# Patient Record
Sex: Female | Born: 1960
Health system: Southern US, Community
[De-identification: ages and names within clinical notes are randomized; demographics above are authoritative.]

## PROBLEM LIST (undated history)

## (undated) DIAGNOSIS — E039 Hypothyroidism, unspecified: Secondary | ICD-10-CM

## (undated) DIAGNOSIS — S83289A Other tear of lateral meniscus, current injury, unspecified knee, initial encounter: Secondary | ICD-10-CM

## (undated) DIAGNOSIS — M47812 Spondylosis without myelopathy or radiculopathy, cervical region: Secondary | ICD-10-CM

## (undated) DIAGNOSIS — F329 Major depressive disorder, single episode, unspecified: Secondary | ICD-10-CM

## (undated) DIAGNOSIS — E785 Hyperlipidemia, unspecified: Secondary | ICD-10-CM

## (undated) DIAGNOSIS — C801 Malignant (primary) neoplasm, unspecified: Secondary | ICD-10-CM

## (undated) DIAGNOSIS — E079 Disorder of thyroid, unspecified: Secondary | ICD-10-CM

## (undated) DIAGNOSIS — G5600 Carpal tunnel syndrome, unspecified upper limb: Secondary | ICD-10-CM

## (undated) DIAGNOSIS — F319 Bipolar disorder, unspecified: Secondary | ICD-10-CM

## (undated) DIAGNOSIS — M94261 Chondromalacia, right knee: Secondary | ICD-10-CM

## (undated) DIAGNOSIS — G5601 Carpal tunnel syndrome, right upper limb: Secondary | ICD-10-CM

## (undated) DIAGNOSIS — F32A Depression, unspecified: Secondary | ICD-10-CM

## (undated) DIAGNOSIS — S83231A Complex tear of medial meniscus, current injury, right knee, initial encounter: Secondary | ICD-10-CM

## (undated) DIAGNOSIS — Z8669 Personal history of other diseases of the nervous system and sense organs: Secondary | ICD-10-CM

## (undated) DIAGNOSIS — F419 Anxiety disorder, unspecified: Secondary | ICD-10-CM

## (undated) DIAGNOSIS — Z860100 Personal history of colon polyps, unspecified: Secondary | ICD-10-CM

## (undated) DIAGNOSIS — T7840XA Allergy, unspecified, initial encounter: Secondary | ICD-10-CM

## (undated) DIAGNOSIS — B009 Herpesviral infection, unspecified: Secondary | ICD-10-CM

## (undated) DIAGNOSIS — R519 Headache, unspecified: Secondary | ICD-10-CM

## (undated) DIAGNOSIS — M1990 Unspecified osteoarthritis, unspecified site: Secondary | ICD-10-CM

## (undated) HISTORY — PX: SKIN CANCER EXCISION: SHX779

## (undated) HISTORY — DX: Spondylosis without myelopathy or radiculopathy, cervical region: M47.812

## (undated) HISTORY — DX: Hyperlipidemia, unspecified: E78.5

## (undated) HISTORY — DX: Herpesviral infection, unspecified: B00.9

## (undated) HISTORY — DX: Disorder of thyroid, unspecified: E07.9

## (undated) HISTORY — DX: Allergy, unspecified, initial encounter: T78.40XA

## (undated) HISTORY — PX: COLONOSCOPY: SHX174

## (undated) HISTORY — DX: Carpal tunnel syndrome, unspecified upper limb: G56.00

---

## 1997-05-29 ENCOUNTER — Other Ambulatory Visit: Admission: RE | Admit: 1997-05-29 | Discharge: 1997-05-29 | Payer: Self-pay | Admitting: Obstetrics and Gynecology

## 1998-01-30 ENCOUNTER — Ambulatory Visit (HOSPITAL_COMMUNITY): Admission: RE | Admit: 1998-01-30 | Discharge: 1998-01-30 | Payer: Self-pay | Admitting: Gastroenterology

## 1998-01-31 ENCOUNTER — Encounter: Payer: Self-pay | Admitting: Obstetrics and Gynecology

## 1998-01-31 ENCOUNTER — Ambulatory Visit (HOSPITAL_COMMUNITY): Admission: RE | Admit: 1998-01-31 | Discharge: 1998-01-31 | Payer: Self-pay | Admitting: Obstetrics and Gynecology

## 1998-02-14 ENCOUNTER — Encounter: Payer: Self-pay | Admitting: Family Medicine

## 1998-02-14 ENCOUNTER — Ambulatory Visit (HOSPITAL_COMMUNITY): Admission: RE | Admit: 1998-02-14 | Discharge: 1998-02-14 | Payer: Self-pay | Admitting: Family Medicine

## 1998-05-30 ENCOUNTER — Other Ambulatory Visit: Admission: RE | Admit: 1998-05-30 | Discharge: 1998-05-30 | Payer: Self-pay | Admitting: Obstetrics and Gynecology

## 1999-06-05 ENCOUNTER — Other Ambulatory Visit: Admission: RE | Admit: 1999-06-05 | Discharge: 1999-06-05 | Payer: Self-pay | Admitting: Obstetrics and Gynecology

## 1999-09-11 ENCOUNTER — Encounter (INDEPENDENT_AMBULATORY_CARE_PROVIDER_SITE_OTHER): Payer: Self-pay

## 1999-09-11 ENCOUNTER — Other Ambulatory Visit: Admission: RE | Admit: 1999-09-11 | Discharge: 1999-09-11 | Payer: Self-pay | Admitting: Obstetrics and Gynecology

## 1999-11-27 ENCOUNTER — Ambulatory Visit (HOSPITAL_COMMUNITY): Admission: RE | Admit: 1999-11-27 | Discharge: 1999-11-27 | Payer: Self-pay | Admitting: Obstetrics and Gynecology

## 1999-11-27 ENCOUNTER — Encounter: Payer: Self-pay | Admitting: Obstetrics and Gynecology

## 1999-12-02 ENCOUNTER — Emergency Department (HOSPITAL_COMMUNITY): Admission: EM | Admit: 1999-12-02 | Discharge: 1999-12-03 | Payer: Self-pay | Admitting: Emergency Medicine

## 2000-02-23 ENCOUNTER — Other Ambulatory Visit: Admission: RE | Admit: 2000-02-23 | Discharge: 2000-02-23 | Payer: Self-pay | Admitting: Obstetrics and Gynecology

## 2000-02-25 ENCOUNTER — Other Ambulatory Visit: Admission: RE | Admit: 2000-02-25 | Discharge: 2000-02-25 | Payer: Self-pay | Admitting: Gynecology

## 2000-07-22 ENCOUNTER — Encounter: Admission: RE | Admit: 2000-07-22 | Discharge: 2000-10-20 | Payer: Self-pay | Admitting: Gynecology

## 2000-08-30 ENCOUNTER — Inpatient Hospital Stay (HOSPITAL_COMMUNITY): Admission: AD | Admit: 2000-08-30 | Discharge: 2000-08-30 | Payer: Self-pay | Admitting: Gynecology

## 2000-09-23 ENCOUNTER — Inpatient Hospital Stay (HOSPITAL_COMMUNITY): Admission: AD | Admit: 2000-09-23 | Discharge: 2000-09-26 | Payer: Self-pay | Admitting: Gynecology

## 2000-09-23 ENCOUNTER — Encounter (INDEPENDENT_AMBULATORY_CARE_PROVIDER_SITE_OTHER): Payer: Self-pay | Admitting: *Deleted

## 2000-09-23 HISTORY — PX: TUBAL LIGATION: SHX77

## 2000-11-02 ENCOUNTER — Other Ambulatory Visit: Admission: RE | Admit: 2000-11-02 | Discharge: 2000-11-02 | Payer: Self-pay | Admitting: Gynecology

## 2001-11-10 ENCOUNTER — Other Ambulatory Visit: Admission: RE | Admit: 2001-11-10 | Discharge: 2001-11-10 | Payer: Self-pay | Admitting: Obstetrics and Gynecology

## 2002-11-11 ENCOUNTER — Other Ambulatory Visit: Admission: RE | Admit: 2002-11-11 | Discharge: 2002-11-11 | Payer: Self-pay | Admitting: Obstetrics and Gynecology

## 2002-12-25 ENCOUNTER — Emergency Department (HOSPITAL_COMMUNITY): Admission: EM | Admit: 2002-12-25 | Discharge: 2002-12-26 | Payer: Self-pay | Admitting: Emergency Medicine

## 2003-11-16 ENCOUNTER — Other Ambulatory Visit: Admission: RE | Admit: 2003-11-16 | Discharge: 2003-11-16 | Payer: Self-pay | Admitting: Obstetrics and Gynecology

## 2005-01-17 ENCOUNTER — Other Ambulatory Visit: Admission: RE | Admit: 2005-01-17 | Discharge: 2005-01-17 | Payer: Self-pay | Admitting: Obstetrics and Gynecology

## 2006-01-19 ENCOUNTER — Other Ambulatory Visit: Admission: RE | Admit: 2006-01-19 | Discharge: 2006-01-19 | Payer: Self-pay | Admitting: Obstetrics and Gynecology

## 2007-03-17 ENCOUNTER — Other Ambulatory Visit: Admission: RE | Admit: 2007-03-17 | Discharge: 2007-03-17 | Payer: Self-pay | Admitting: Obstetrics and Gynecology

## 2007-07-26 DIAGNOSIS — F319 Bipolar disorder, unspecified: Secondary | ICD-10-CM | POA: Insufficient documentation

## 2007-07-27 ENCOUNTER — Ambulatory Visit: Payer: Self-pay | Admitting: Gastroenterology

## 2007-07-27 DIAGNOSIS — R1032 Left lower quadrant pain: Secondary | ICD-10-CM

## 2008-03-01 ENCOUNTER — Ambulatory Visit: Payer: Self-pay | Admitting: Obstetrics and Gynecology

## 2008-03-07 ENCOUNTER — Ambulatory Visit: Payer: Self-pay | Admitting: Obstetrics and Gynecology

## 2008-04-18 ENCOUNTER — Encounter: Payer: Self-pay | Admitting: Obstetrics and Gynecology

## 2008-04-18 ENCOUNTER — Ambulatory Visit: Payer: Self-pay | Admitting: Obstetrics and Gynecology

## 2008-04-18 ENCOUNTER — Other Ambulatory Visit: Admission: RE | Admit: 2008-04-18 | Discharge: 2008-04-18 | Payer: Self-pay | Admitting: Obstetrics and Gynecology

## 2008-05-31 ENCOUNTER — Ambulatory Visit: Payer: Self-pay | Admitting: Gastroenterology

## 2008-06-14 ENCOUNTER — Ambulatory Visit: Payer: Self-pay | Admitting: Gastroenterology

## 2009-04-30 ENCOUNTER — Other Ambulatory Visit: Admission: RE | Admit: 2009-04-30 | Discharge: 2009-04-30 | Payer: Self-pay | Admitting: Obstetrics and Gynecology

## 2009-04-30 ENCOUNTER — Ambulatory Visit: Payer: Self-pay | Admitting: Obstetrics and Gynecology

## 2010-02-28 ENCOUNTER — Institutional Professional Consult (permissible substitution) (INDEPENDENT_AMBULATORY_CARE_PROVIDER_SITE_OTHER): Payer: 59 | Admitting: Obstetrics and Gynecology

## 2010-02-28 DIAGNOSIS — N951 Menopausal and female climacteric states: Secondary | ICD-10-CM

## 2010-02-28 DIAGNOSIS — N952 Postmenopausal atrophic vaginitis: Secondary | ICD-10-CM

## 2010-05-08 ENCOUNTER — Encounter: Payer: 59 | Admitting: Obstetrics and Gynecology

## 2010-05-20 ENCOUNTER — Other Ambulatory Visit (HOSPITAL_COMMUNITY)
Admission: RE | Admit: 2010-05-20 | Discharge: 2010-05-20 | Disposition: A | Discharge status: Home / Self Care | Payer: 59 | Source: Ambulatory Visit | Attending: Obstetrics and Gynecology | Admitting: Obstetrics and Gynecology

## 2010-05-20 ENCOUNTER — Encounter (INDEPENDENT_AMBULATORY_CARE_PROVIDER_SITE_OTHER): Payer: 59 | Admitting: Obstetrics and Gynecology

## 2010-05-20 ENCOUNTER — Other Ambulatory Visit: Payer: Self-pay | Admitting: Obstetrics and Gynecology

## 2010-05-20 DIAGNOSIS — Z833 Family history of diabetes mellitus: Secondary | ICD-10-CM

## 2010-05-20 DIAGNOSIS — Z124 Encounter for screening for malignant neoplasm of cervix: Secondary | ICD-10-CM | POA: Insufficient documentation

## 2010-05-20 DIAGNOSIS — Z13220 Encounter for screening for lipoid disorders: Secondary | ICD-10-CM

## 2010-05-20 DIAGNOSIS — E079 Disorder of thyroid, unspecified: Secondary | ICD-10-CM

## 2010-05-20 DIAGNOSIS — Z01419 Encounter for gynecological examination (general) (routine) without abnormal findings: Secondary | ICD-10-CM

## 2010-05-31 NOTE — Discharge Summary (Signed)
[place]  Patient:    Ann Mendez, Ann Mendez Visit Number: [id number] MRN: [id number]          Service Type: OBS Location: 910A 9101 01 Attending Physician:  Merrily Pew Dictated by:   Antony Contras, [facility] Admit Date:  09/23/2000 Discharge Date: 09/26/2000                             Discharge Summary  DISCHARGE DIAGNOSES: 1. Intrauterine pregnancy at 37 weeks. 2. Labor. 3. Breech presentation. 4. Failed version. 5. Desires permanent sterilization. 6. Leiomyomata.  PROCEDURES:  Primary low transverse cesarean section, modified Pomeroy bilateral tubal sterilization.  HISTORY OF PRESENT ILLNESS:  Patient is a 50 year old gravida 6, para 1-0-4-1 with an LMP of January 05, 2000, [facility] of October 12, 2000.  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  Patient declined amniocentesis, bipolar disorder, history of HELLP syndrome with previous pregnancy.  LABORATORY DATA:  Blood type O positive, antibody screen negative, RPR, HBSAG, HIV nonreactive.  HOSPITAL COURSE:  The patient was admitted on September 23, 2000 for an attempted version secondary to frank breech presentation.  She was found to be in early labor prior to the version and the attempted version failed.  Due to consistent contractions, she decided to proceed with a cesarean section since she did also desire permanent sterilization.  Primary low-transverse cervical cesarean section modified Pomeroy bilateral tubal was performed by Dr. Audie Box under spinal anesthesia.  Findings included a normal female infant, Apgars of 8/9, 7 pounds 5 ounces, pelvic anatomy normal with exception of several small subserosal leiomyoma.  POSTPARTUM COURSE:  The patient did remain afebrile and was able to be discharged on her third postoperative day in satisfactory condition.  She also had no problems voiding.  CBC, hematocrit 27, hemoglobin 9.4, WBCs 6.7, platelets  232.  DISPOSITION:  Followup in six weeks.  Continue with prenatal vitamins and iron, Motrin and Tylox for pain. Dictated by:   Antony Contras, [facility] Attending Physician:  Merrily Pew DD:  10/16/00 TD:  10/17/00 Job: [id number]

## 2010-05-31 NOTE — Op Note (Signed)
[facility]  Patient:    Ann Mendez, Ann Mendez Visit Number: [id number] MRN: [id number]          Service Type: OBS Location: 910A 9102 01 Attending Physician:  Merrily Pew Proc. Date: 09/23/00 Admit Date:  09/23/2000                             Operative Report  PROCEDURE:                    Attempted external cephalic version.  SURGEON:                      Timothy P. Fontaine, M.D.  DESCRIPTION OF PROCEDURE:     The patient is a 50 year old G6, P44, AB4 female at [redacted] weeks gestation with frank breech presentation.  The patient has history of preterm contractions for which she has been treated with oral terbutaline and has done well; and, it now found to be in the frank breech presentation. The risks and benefits of attempted version were just reviewed with the patient and her husband to include the potential for a significant catastrophic event such as cord or placental accident, and they agree with attempted version.  The patients placenta is anterior.  Upon presentation for the version she was found to be contracting every two to three minutes, although these were not palpable to her and she received IV hydration and a subcu terbutaline 0.25 mg prior to the attempted procedure.  Under ultrasound guidance attempted version was performed and the fetus was solid in its presentation and and was unable to be rotated to any significant degree either through a front rollover or a back flip.  Due to significant patient discomfort and that it was obvious that the fetus was not going to convert easily, the procedure was abandoned.  The patient was subsequently placed on external monitors and had a reactive fetal tracing as it did prior to the procedure; and, the patient continued to contract every two to three minutes.  Pelvic exam was performed.  The patient was found to be a loose fingertip dilated, 50% effaced and high; and, after extended  monitoring showing continued contractions every two to three minutes the decision was made to proceed with cesarean section as was discussed with the patient and the counselling as noted in her admission history and physical. Attending Physician:  Merrily Pew DD:  09/23/00 TD:  09/23/00 Job: [id number]

## 2010-05-31 NOTE — Op Note (Signed)
[facility]  Patient:    Ann Mendez, Ann Mendez Visit Number: [id number] MRN: [id number]          Service Type: OBS Location: 910A 9102 01 Attending Physician:  Merrily Pew Proc. Date: 09/23/00 Admit Date:  09/23/2000                             Operative Report  PREOPERATIVE DIAGNOSES:       1. Pregnancy at 37 weeks.                               2. Labor.                               3. Breech presentation.                               4. Failed version.                               5. Desires permanent sterilization.  POSTOPERATIVE DIAGNOSES:      1. Pregnancy at 37 weeks.                               2. Labor.                               3. Breech presentation.                               4. Failed version.                               5. Desires permanent sterilization.                               6. Leiomyomata.  OPERATIONS:                   1. Primary low transverse cervical cesarean                                  section.                               2. Modified Pomeroy bilateral tubal                                  sterilization. SURGEON:                      Timothy P. Fontaine, M.D.  ASSISTANT:                    Scrub technician.  ANESTHESIA:                   Spinal.  ESTIMATED  BLOOD LOSS:         Less than 500 cc.  COMPLICATIONS:                None.  SPECIMENS:                    Portions of right and portions of left fallopian tubes.  Placenta.  Umbilical cord blood.  FINDINGS:                     At 16:56 a normal female infant, Apgars 8 and 9, weight 7 pounds 5 ounces.  Pelvic anatomy noted to be normal with the exception of several small subserosal leiomyomata.  DESCRIPTION OF PROCEDURE:     Patient was brought to the operating room, underwent spinal anesthesia and was placed in the left tilt to supine position.  She received an abdominal preparation of Betadine solution. Bladder was emptied  with an indwelling Foley catheterization placed in a sterile technique.  The patient was draped in the usual fashion.  After assuring adequate anesthesia the abdomen was sharply entered through a Pfannenstiel incision achieving adequate hemostasis at all levels.  The bladder flap was then sharply and bluntly developed without difficulty.  The uterus was sharply entered in the lower uterine segment and bluntly extended laterally.  The bulging membranes were ruptured; the fluid noted to be clear.  The infant was found in the frank breech presentation, converted to a footling breech presentation, and underwent a breech extraction.  The nares and mouth were then suctioned, the cord doubly clamped and cut, and the infant was handed to pediatrics in attendance.  Samples of cord blood were obtained.  Placenta was then spontaneously extruded and noted to be intact.  The uterus was exteriorized.  The endometrial cavity was explored with a sponge to remove all placental and membrane fragments.  The patient received antibiotic prophylaxis at this time and subsequently the uterine incision was closed in two layers using 0-Vicryl suture first in a running interlocking stitch followed by an imbricating stitch.  The attention was then turned to the tubal sterilization.  The right fallopian tube was identified, traced from its insertion to its fimbriated end.  A mid tubal segment was grasped with a Babcock clamp, elevated and a mid tubal segment was doubly ligated using 0-plain suture, and the intervening segment sharply excised.  Tubal lumen as well as adequate hemostasis were grossly visualized.  A similar procedure was carried on the other side.  The uterus was then returned to the abdomen, which was copiously irrigated showing adequate hemostasis.  Both tubal sites were reinspected to assure adequate hemostasis.  The anterior fascia was then reapproximated using 0-Vicryl suture in a running  stitch.  Subcutaneous tissues were irrigated. Adequate hemostasis was achieved with electrocautery and the skin was reapproximated with staples.  A sterile dressing was applied.  The patient was taken to the recovery room in good condition having tolerated the procedure well. Attending Physician:  Merrily Pew DD:  09/23/00 TD:  09/23/00 Job: [id number]

## 2010-05-31 NOTE — H&P (Signed)
[facility]  Patient:    Ann Mendez, Ann Mendez Visit Number: [id number] MRN: [id number]          Service Type: OBS Location: 910A 9102 01 Attending Physician:  Merrily Pew Dictated by:   Nadyne Coombes Fontaine, M.D. Admit Date:  09/23/2000                           History and Physical  CHIEF COMPLAINT:              Pregnancy of 37 weeks, frank breech presentation, early labor, failed attempted version.  HISTORY OF PRESENT ILLNESS:   A 50 year old G33, P68, AB4 female at [redacted] weeks gestation, admitted with history of preterm labor on oral terbutaline.  The patient was found to be in the frank breech presentation and was counseled for attempted version versus primary cesarean section and elected for attempted version.  An attempted version was made today.  She was found to be in early labor prior to the version and the attempted version failed.  She subsequently was found to continue to contract consistent with early labor and elected to proceed with cesarean section at this time.  The patient also wants a bilateral tubal sterilization.  PRENATAL COURSE:              Prenatal course has been complicated by preterm contractions, for which she was started on oral terbutaline and which she did well until the day of version, at which time she began contracting on a regular basis early in the morning and continuing through her attempted version.  PAST MEDICAL HISTORY:         Consistent with bipolar disorder, for which she is on Prozac.  PAST SURGICAL HISTORY:        Includes D&C.  ALLERGIES:                    SULFA DRUGS.  MEDICATIONS:                  Prozac, prenatal vitamins, terbutaline oral.  REVIEW OF SYSTEMS:            Noncontributory.  ADMISSION PHYSICAL EXAMINATION:  VITAL SIGNS:                  Afebrile, vital signs are stable.  HEENT:                        Normal.  LUNGS:                        Clear.  CARDIAC:                       Regular rate without rubs, murmurs, or gallops.  ABDOMEN:                      With a gravid breech presentation.  PELVIC:                       Reactive fetus.  Contractions every 2-3 minutes. Pelvic loose fingertip, 50% high.  ASSESSMENT:                   A 50 year old G6, P57, AB4 female, 37 weeks, breech presentation, failed version, early labor, for cesarean section and tubal sterilization.  The risks, benefits, indications, and  alternatives for the procedure were discussed with the patient and she elects to proceed with cesarean section.  The permanency as well as the potential for failure with her tubal sterilization was reviewed, and the patient understands this and wants to proceed with permanent sterilization.  The intraoperative and postoperative risks of the surgery to include inadvertent injury to the internal organs, including bowel, bladder, ureters, vessels, and nerves, necessitating measures for reparative surgeries and future reparative surgeries, including ostomy formation, was all discussed with her.  The risks and complications requiring opening and drainage of incisions, closure by secondary intention, or prolonged antibiotics was all reviewed with her.  The risks of hemorrhage necessitating transfusion and the risks of transfusion up to and including HIV, were all discussed, understood, and accepted.  The patients questions were answered to her satisfaction.  She is ready to proceed with surgery.Dictated by:   Nadyne Coombes. Fontaine, M.D. Attending Physician:  Merrily Pew DD:  09/23/00 TD:  09/23/00 Job: [phone]

## 2010-07-03 ENCOUNTER — Encounter (INDEPENDENT_AMBULATORY_CARE_PROVIDER_SITE_OTHER): Payer: 59

## 2010-07-03 DIAGNOSIS — Z13820 Encounter for screening for osteoporosis: Secondary | ICD-10-CM

## 2010-09-27 ENCOUNTER — Encounter: Payer: Self-pay | Admitting: Obstetrics and Gynecology

## 2010-10-22 ENCOUNTER — Telehealth: Payer: Self-pay | Admitting: *Deleted

## 2010-10-22 MED ORDER — PROGESTERONE MICRONIZED 100 MG PO CAPS: 100.0000 mg | ORAL_CAPSULE | Freq: Every day | ORAL | Status: DC

## 2010-10-22 NOTE — Telephone Encounter (Signed)
Pt informed and wants to try taking it daily and will start in Novemebr. rx sent to pharmacy.

## 2010-10-22 NOTE — Telephone Encounter (Signed)
Pt states doing fine on estrogen patch and on progesterone D1-12. And now is having period. She states she is ok with having period again bc of progesterone but if there was a way she can not have a period that would be good too. CAn she take progesterone everyday? PLs advise?

## 2010-10-22 NOTE — Telephone Encounter (Signed)
She could do her progesterone daily. She would have to reduce the dose to 2.5 mg daily. There could be an adjustment where she has irregular bleeding.

## 2010-12-17 ENCOUNTER — Telehealth: Payer: Self-pay

## 2010-12-17 MED ORDER — MEDROXYPROGESTERONE ACETATE 2.5 MG PO TABS: 2.5000 mg | ORAL_TABLET | Freq: Every day | ORAL | Status: DC

## 2010-12-17 NOTE — Telephone Encounter (Signed)
PT. TOOK 21 DAYS OF GENERIC PROMETRIUM 100MG . & DEVELOPED SEVERE DIZZINESS,NAUSEA, DROWSINESS. SINCE STOPPING RX PTS. SYMPTOMS HAVE COMPLETELY RESOLVED. DOES NOT WANT TO USE RX ANYMORE. WHAT ELSE CANSHE DO?

## 2010-12-17 NOTE — Telephone Encounter (Signed)
Medroxyprogesterone 2.5 mg daily

## 2010-12-17 NOTE — Telephone Encounter (Signed)
PT. NOTIFIED OF DR. G'S NOTE BELOW AND SENT RX TO HER PHARMACY. 

## 2011-01-01 ENCOUNTER — Telehealth: Payer: Self-pay | Admitting: *Deleted

## 2011-01-01 NOTE — Telephone Encounter (Signed)
Pt called stating she is doing very well on her new progesterone medication.

## 2011-01-15 ENCOUNTER — Other Ambulatory Visit: Payer: Self-pay | Admitting: Obstetrics and Gynecology

## 2011-03-19 ENCOUNTER — Telehealth: Payer: Self-pay | Admitting: *Deleted

## 2011-03-19 MED ORDER — ESTROGENS, CONJUGATED 0.625 MG/GM VA CREA: TOPICAL_CREAM | VAGINAL | Status: DC

## 2011-03-19 NOTE — Telephone Encounter (Signed)
Pt called c/o vaginal dryness during sex, which makes it very painful. She was taking generic estrogen patch and Medroxyprogesterone 2.5 mg daily which she has stopped because it was not working. Pt said it made her mood bad, pt said that Dr. Nolen Mu placed her on abilify, so pt would like to know if you would recommend something OTC to help with vaginal dryness? She has tried KY jelly beads but she can no longer find them. Please advise

## 2011-03-19 NOTE — Telephone Encounter (Signed)
What would work better is Premarin vaginal cream half a gram at bedtime in vagina 3 times a week. It has estrogen which will get rid of the dryness. There is very little absorption so it is safe. It'll take about 6 weeks to be fully effective.

## 2011-03-19 NOTE — Telephone Encounter (Signed)
Per pt okay to leave detailed message on voicemail all the below was left on vm. rx sent to pharmacy.

## 2011-05-13 ENCOUNTER — Encounter: Payer: Self-pay | Admitting: Gynecology

## 2011-05-13 DIAGNOSIS — A6000 Herpesviral infection of urogenital system, unspecified: Secondary | ICD-10-CM | POA: Insufficient documentation

## 2011-05-13 DIAGNOSIS — M1990 Unspecified osteoarthritis, unspecified site: Secondary | ICD-10-CM | POA: Insufficient documentation

## 2011-05-23 ENCOUNTER — Encounter: Payer: Self-pay | Admitting: Obstetrics and Gynecology

## 2011-05-23 ENCOUNTER — Ambulatory Visit (INDEPENDENT_AMBULATORY_CARE_PROVIDER_SITE_OTHER): Payer: 59 | Admitting: Obstetrics and Gynecology

## 2011-05-23 VITALS — BP 116/68 | Ht 62.0 in | Wt 110.0 lb

## 2011-05-23 DIAGNOSIS — Z01419 Encounter for gynecological examination (general) (routine) without abnormal findings: Secondary | ICD-10-CM

## 2011-05-23 LAB — CBC WITH DIFFERENTIAL/PLATELET
Basophils Absolute: 0.1 10*3/uL (ref 0.0–0.1)
Basophils Relative: 1 % (ref 0–1)
Eosinophils Absolute: 0 10*3/uL (ref 0.0–0.7)
Eosinophils Relative: 1 % (ref 0–5)
Lymphocytes Relative: 40 % (ref 12–46)
MCH: 32.2 pg (ref 26.0–34.0)
MCHC: 33.5 g/dL (ref 30.0–36.0)
MCV: 96.2 fL (ref 78.0–100.0)
Platelets: 373 10*3/uL (ref 150–400)
RDW: 12.3 % (ref 11.5–15.5)
WBC: 5.9 10*3/uL (ref 4.0–10.5)

## 2011-05-23 MED ORDER — ESTROGENS, CONJUGATED 0.625 MG/GM VA CREA: TOPICAL_CREAM | VAGINAL | Status: DC

## 2011-05-23 MED ORDER — ACYCLOVIR 200 MG PO CAPS: 400.0000 mg | ORAL_CAPSULE | Freq: Every morning | ORAL | Status: DC

## 2011-05-23 NOTE — Progress Notes (Signed)
Patient came to see me today for her annual GYN exam. She stopped HRT because of side effects both with Prometrium and medroxyprogesterone. Her psychiatrist added abilify and that has helped her control the hot flashes. She still has them but they are tolerable. She uses Premarin vaginal cream for vaginal dryness. Since she stopped the HRT she's had no vaginal bleeding. She is having no pelvic pain. She had a mammogram in September which was normal. She had a normal bone density last year. She uses Zovirax 400 mg daily for prevention of HSV. When day she took 200 mg daily and got a reoccurrence so she's back on 400 daily. She had a normal cholesterol last year.  Physical examination: Beola Cord present. HEENT within normal limits. Neck: Thyroid not large. No masses. Supraclavicular nodes: not enlarged. Breasts: Examined in both sitting and lying  position. No skin changes and no masses. Abdomen: Soft no guarding rebound or masses or hernia. Pelvic: External: Within normal limits. BUS: Within normal limits. Vaginal:within normal limits. Good estrogen effect. No evidence of cystocele rectocele or enterocele. Cervix: clean. Uterus: Normal size and shape. Adnexa: No masses. Rectovaginal exam: Confirmatory and negative. Extremities: Within normal limits.  Assessment: #1. Menopausal symptoms #2. HSV  Plan: Discussed a half strength or full strength Activella. For the moment she will wait. Continue Premarin vaginal cream. Continue acyclovir 400  milligrams daily. Continue yearly mammograms.

## 2011-05-28 ENCOUNTER — Encounter: Payer: Self-pay | Admitting: Obstetrics and Gynecology

## 2011-09-09 ENCOUNTER — Telehealth: Payer: Self-pay | Admitting: Obstetrics and Gynecology

## 2011-09-09 NOTE — Telephone Encounter (Signed)
Patient said at yearly exam you discussed problems she was having with estrogen patch since adding the progesterone back.  Patient said you told her to call if problems continued.  She said she is continuing to have hotflashes.  She said her biggest complaint is difficulty falling asleep, staying asleep and waking in the night and not being able to go back to sleep.  Interested in what other options are available to help.

## 2011-09-09 NOTE — Telephone Encounter (Signed)
There are other options and is complicated. Make her office visit to discuss.

## 2011-09-09 NOTE — Telephone Encounter (Signed)
Left message for patient to call and schedule consult to discuss options as they are complicated and Dr. Reece Agar would like to sit down with her and discuss.

## 2011-09-17 ENCOUNTER — Ambulatory Visit (INDEPENDENT_AMBULATORY_CARE_PROVIDER_SITE_OTHER): Payer: 59 | Admitting: Obstetrics and Gynecology

## 2011-09-17 DIAGNOSIS — Z780 Asymptomatic menopausal state: Secondary | ICD-10-CM

## 2011-09-17 MED ORDER — MEDROXYPROGESTERONE ACETATE 2.5 MG PO TABS: 2.5000 mg | ORAL_TABLET | Freq: Every day | ORAL | Status: DC

## 2011-09-17 MED ORDER — ESTRADIOL 0.05 MG/24HR TD PTWK: 1.0000 | MEDICATED_PATCH | TRANSDERMAL | Status: DC

## 2011-09-17 NOTE — Patient Instructions (Signed)
Keep me updated

## 2011-09-17 NOTE — Progress Notes (Signed)
Patient came to see me today to discuss menopausal symptoms. She is having severe hot flashes associated with sleep disturbance and some vaginal dryness in spite of using estrogen cream. She is also noticed diminished libido. She tried testosterone cream without results. She does see Dr. Nolen Mu for bipolar disorder and is on medication for that. Her hot flashes and sleep problems were fine when she was on the estrogen patch but when she did cyclical progesterone she got severe PMS when she started her period. She also tried daily Prometrium and had side effects from it. We had a 35 minute discussion today about options. Options discussed included daily progesterone other than Prometrium with hopes that if she doesn't have withdrawal bleeding she will not get PMS. We also discussed a Mirena IUD. We also discussed progesterone every 3 months or none at  All with monitoring of the endometrium with either ultrasound or biopsy. We also discussed hopefully availability of Tsec if it gets FDA approval. We will start with Climara patch 0.05 mg weekly and daily medroxyprogesterone 2.5 mg daily. She will let me  know how she is doing. She will discussed medication change with Dr. Nolen Mu for her libido.

## 2011-09-26 ENCOUNTER — Telehealth: Payer: Self-pay | Admitting: *Deleted

## 2011-09-26 NOTE — Telephone Encounter (Signed)
(  Pt is aware you are out of the office) pt is taking climara patch & provera 2.5 mg daily. C/o that she feels more depressed that usually for about x 1 week now. No suicidal thoughts. Pt said she felt the same way before when she tried HRT, she asked if there is a natural progesterone (compound form) her sister uses this kind. Pt would like you advice if the above could cause her to feel this way. Please advise

## 2011-09-27 NOTE — Telephone Encounter (Signed)
prometrium 100mg  daily

## 2011-10-09 ENCOUNTER — Ambulatory Visit (INDEPENDENT_AMBULATORY_CARE_PROVIDER_SITE_OTHER): Payer: 59 | Admitting: Internal Medicine

## 2011-10-09 ENCOUNTER — Encounter: Payer: Self-pay | Admitting: Internal Medicine

## 2011-10-09 VITALS — BP 118/60 | HR 68 | Temp 97.2°F | Resp 18 | Wt 113.0 lb

## 2011-10-09 DIAGNOSIS — Z780 Asymptomatic menopausal state: Secondary | ICD-10-CM | POA: Insufficient documentation

## 2011-10-09 DIAGNOSIS — Z8669 Personal history of other diseases of the nervous system and sense organs: Secondary | ICD-10-CM

## 2011-10-09 DIAGNOSIS — F319 Bipolar disorder, unspecified: Secondary | ICD-10-CM

## 2011-10-09 DIAGNOSIS — E785 Hyperlipidemia, unspecified: Secondary | ICD-10-CM | POA: Insufficient documentation

## 2011-10-09 DIAGNOSIS — M4800 Spinal stenosis, site unspecified: Secondary | ICD-10-CM

## 2011-10-09 DIAGNOSIS — M1990 Unspecified osteoarthritis, unspecified site: Secondary | ICD-10-CM

## 2011-10-09 LAB — TSH: TSH: 3.796 u[IU]/mL (ref 0.350–4.500)

## 2011-10-09 LAB — COMPREHENSIVE METABOLIC PANEL
ALT: 16 U/L (ref 0–35)
AST: 21 U/L (ref 0–37)
Alkaline Phosphatase: 52 U/L (ref 39–117)
Creat: 0.66 mg/dL (ref 0.50–1.10)
Total Bilirubin: 0.5 mg/dL (ref 0.3–1.2)

## 2011-10-09 LAB — LIPID PANEL
Total CHOL/HDL Ratio: 3.7 Ratio
VLDL: 32 mg/dL (ref 0–40)

## 2011-10-09 NOTE — Patient Instructions (Addendum)
Schedule cpe  Labs will be mailed to you

## 2011-10-09 NOTE — Progress Notes (Signed)
Subjective:    Patient ID: Ann Mendez, female    DOB: December 09, 1960, 51 y.o.   MRN: [id number]  HPI Ann Mendez is  A new pt here to establish primary care.  PMH of BAD managed by Dr. Nolen Mu, DJD, hyperlipidemia,  Spinal stenosis, and history of bell's palsy.  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    She does have lower libido but is managing this OK.  She has 53 yo at home  Husband supportive.   Allergies  Allergen Reactions  . Sulfonamide Derivatives    Past Medical History  Diagnosis Date  . Herpes progenitalis   . Osteoarthritis    Past Surgical History  Procedure Date  . Tubal ligation   . Cesarean section    History   Social History  . Marital Status: Married    Spouse Name: N/A    Number of Children: N/A  . Years of Education: N/A   Occupational History  . Not on file.   Social History Main Topics  . Smoking status: Never Smoker   . Smokeless tobacco: Never Used  . Alcohol Use: 0.6 oz/week    1 Glasses of wine per week  . Drug Use: No  . Sexually Active: Yes    Birth Control/ Protection: Surgical   Other Topics Concern  . Not on file   Social History Narrative  . No narrative on file   Family History  Problem Relation Age of Onset  . Heart disease Father   . Hypertension Father   . Cancer Father   . Heart disease Brother   . Hypertension Brother   . Cancer Brother   . Diabetes Maternal Grandfather   . Arthritis Mother    Patient Active Problem List  Diagnosis  . BIPOLAR DISORDER UNSPECIFIED  . ABDOMINAL PAIN, LEFT LOWER QUADRANT  . Herpes progenitalis  . Osteoarthritis  . DJD (degenerative joint disease)  . Other and unspecified hyperlipidemia  . Menopause  . Spinal stenosis  . History of Bell's palsy   Current Outpatient Prescriptions on File Prior to Visit  Medication Sig Dispense  Refill  . acyclovir (ZOVIRAX) 200 MG capsule Take 2 capsules (400 mg total) by mouth every morning.  30 capsule  12  . ARIPiprazole (ABILIFY) 10 MG tablet Take 10 mg by mouth daily.      . Calcium Carbonate-Vitamin D (CALCIUM + D PO) Take by mouth.      . Cholecalciferol (VITAMIN D PO) Take by mouth.      . conjugated estrogens (PREMARIN) vaginal cream Place vaginally 3 (three) times a week. Place 1/2 gram vaginally at bedtime 3 times a week  42.5 g  0  . lamoTRIgine (LAMICTAL) 200 MG tablet Take 200 mg by mouth daily.      . Multiple Vitamin (MULTIVITAMIN) tablet Take 1 tablet by mouth daily.      . sertraline (ZOLOFT) 50 MG tablet Take 50 mg by mouth daily.      Marland Kitchen estradiol (CLIMARA - DOSED IN MG/24 HR) 0.05 mg/24hr Place 1 patch (0.05 mg total) onto the skin once a week.  4 patch  8  . medroxyPROGESTERone (PROVERA) 2.5 MG tablet Take 1 tablet (2.5 mg total) by mouth daily.  30 tablet  8       Review of Systems  see HPI Objective:   Physical Exam  Physical Exam  Nursing note and vitals reviewed.  Constitutional: She is oriented to person, place, and time. She appears well-developed and well-nourished.  HENT:  Head: Normocephalic and atraumatic.  Cardiovascular: Normal rate and regular rhythm. Exam reveals no gallop and no friction rub.  No murmur heard.  Pulmonary/Chest: Breath sounds normal. She has no wheezes. She has no rales.  Neurological: She is alert and oriented to person, place, and time.  Skin: Skin is warm and dry.  Psychiatric: She has a normal mood and affect. Her behavior is normal.            Assessment & Plan:  Menopause   Intolerant of HT    Low libido  Doubt that testosterone would be of great benefit and may change mood with BAD.  Psychiatric meds likely contributing to low libido.  Gave number to counselor [employer].  DJD  BAD managed Dr. Nolen Mu  Spinal stenosis  Managed by orthopedist

## 2011-10-10 LAB — VITAMIN D 25 HYDROXY (VIT D DEFICIENCY, FRACTURES): Vit D, 25-Hydroxy: 66 ng/mL (ref 30–89)

## 2011-10-14 ENCOUNTER — Telehealth: Payer: Self-pay | Admitting: *Deleted

## 2011-10-14 NOTE — Telephone Encounter (Signed)
Lab results mailed to pt.

## 2011-10-14 NOTE — Telephone Encounter (Signed)
Left message awaiting return call

## 2011-10-15 ENCOUNTER — Telehealth: Payer: Self-pay | Admitting: *Deleted

## 2011-10-15 NOTE — Telephone Encounter (Signed)
Pt lab results mailed to pt

## 2011-10-15 NOTE — Telephone Encounter (Signed)
Pt wishes to try diet changes and return in one month for a repeat of labs Dr Constance Goltz made aware and approves copy of labs and dash diet mailed to pt

## 2011-10-17 ENCOUNTER — Encounter: Payer: Self-pay | Admitting: Obstetrics and Gynecology

## 2011-10-31 ENCOUNTER — Encounter: Payer: Self-pay | Admitting: *Deleted

## 2011-11-11 ENCOUNTER — Encounter (HOSPITAL_COMMUNITY): Payer: Self-pay

## 2011-11-11 ENCOUNTER — Emergency Department (HOSPITAL_COMMUNITY)
Admission: EM | Admit: 2011-11-11 | Discharge: 2011-11-11 | Disposition: A | Discharge status: Home / Self Care | Payer: 59 | Attending: Emergency Medicine | Admitting: Emergency Medicine

## 2011-11-11 ENCOUNTER — Ambulatory Visit (HOSPITAL_COMMUNITY)
Admission: RE | Admit: 2011-11-11 | Discharge: 2011-11-11 | Disposition: A | Discharge status: Home / Self Care | Payer: 59 | Attending: Psychiatry | Admitting: Psychiatry

## 2011-11-11 ENCOUNTER — Ambulatory Visit (INDEPENDENT_AMBULATORY_CARE_PROVIDER_SITE_OTHER): Payer: 59 | Admitting: Internal Medicine

## 2011-11-11 ENCOUNTER — Encounter: Payer: Self-pay | Admitting: Internal Medicine

## 2011-11-11 ENCOUNTER — Encounter (HOSPITAL_COMMUNITY): Payer: Self-pay | Admitting: *Deleted

## 2011-11-11 VITALS — BP 148/75 | HR 73 | Temp 97.3°F | Resp 18 | Wt 111.0 lb

## 2011-11-11 DIAGNOSIS — F329 Major depressive disorder, single episode, unspecified: Secondary | ICD-10-CM

## 2011-11-11 DIAGNOSIS — F319 Bipolar disorder, unspecified: Secondary | ICD-10-CM | POA: Insufficient documentation

## 2011-11-11 DIAGNOSIS — F32A Depression, unspecified: Secondary | ICD-10-CM | POA: Insufficient documentation

## 2011-11-11 DIAGNOSIS — Z9851 Tubal ligation status: Secondary | ICD-10-CM | POA: Insufficient documentation

## 2011-11-11 DIAGNOSIS — F3289 Other specified depressive episodes: Secondary | ICD-10-CM | POA: Insufficient documentation

## 2011-11-11 DIAGNOSIS — F3189 Other bipolar disorder: Secondary | ICD-10-CM | POA: Insufficient documentation

## 2011-11-11 DIAGNOSIS — Z8619 Personal history of other infectious and parasitic diseases: Secondary | ICD-10-CM | POA: Insufficient documentation

## 2011-11-11 DIAGNOSIS — Z79899 Other long term (current) drug therapy: Secondary | ICD-10-CM | POA: Insufficient documentation

## 2011-11-11 DIAGNOSIS — M1990 Unspecified osteoarthritis, unspecified site: Secondary | ICD-10-CM | POA: Insufficient documentation

## 2011-11-11 HISTORY — DX: Depression, unspecified: F32.A

## 2011-11-11 HISTORY — DX: Major depressive disorder, single episode, unspecified: F32.9

## 2011-11-11 LAB — CBC WITH DIFFERENTIAL/PLATELET
Basophils Relative: 1 % (ref 0–1)
Hemoglobin: 14.6 g/dL (ref 12.0–15.0)
Lymphocytes Relative: 28 % (ref 12–46)
MCHC: 34.9 g/dL (ref 30.0–36.0)
Monocytes Relative: 8 % (ref 3–12)
Neutro Abs: 3.8 10*3/uL (ref 1.7–7.7)
Neutrophils Relative %: 62 % (ref 43–77)
RBC: 4.48 MIL/uL (ref 3.87–5.11)
WBC: 6.1 10*3/uL (ref 4.0–10.5)

## 2011-11-11 LAB — BASIC METABOLIC PANEL
BUN: 11 mg/dL (ref 6–23)
CO2: 26 mEq/L (ref 19–32)
Chloride: 100 mEq/L (ref 96–112)
GFR calc Af Amer: >90 mL/min (ref >90)
Potassium: 4.3 mEq/L (ref 3.5–5.1)

## 2011-11-11 LAB — RAPID URINE DRUG SCREEN, HOSP PERFORMED
Cocaine: NOT DETECTED
Opiates: NOT DETECTED

## 2011-11-11 MED ORDER — ACETAMINOPHEN 325 MG PO TABS
650.0000 mg | ORAL_TABLET | Freq: Once | ORAL | Status: AC
  Administered 2011-11-11: 650 mg via ORAL
  Filled 2011-11-11: qty 2

## 2011-11-11 NOTE — ED Notes (Signed)
MD at bedside. EDP Zammit present to evaluate this pt

## 2011-11-11 NOTE — Progress Notes (Signed)
Subjective:    Patient ID: Ann Mendez, female    DOB: Aug 31, 1960, 51 y.o.   MRN: [id number]  HPI  Ann Mendez is here for acute visit.  She is crying entire interview.  She reports her depression is much worse.  She was at Dr. Loralie Champagne office yesterday and seen by ??NP Casimiro Needle who increased her Zoloft.  She stated 3 times in room she does not "feel safe" at home by herself.  No plans for suicide but not feeling safe.  She was to start a new job tomorrow and feels she cannot handle this  No homicidal ideation or psychotic features  Allergies  Allergen Reactions  . Sulfonamide Derivatives     Unknown reaction.   Past Medical History  Diagnosis Date  . Herpes progenitalis   . Osteoarthritis    Past Surgical History  Procedure Date  . Tubal ligation   . Cesarean section    History   Social History  . Marital Status: Married    Spouse Name: N/A    Number of Children: N/A  . Years of Education: N/A   Occupational History  . Not on file.   Social History Main Topics  . Smoking status: Never Smoker   . Smokeless tobacco: Never Used  . Alcohol Use: 0.6 oz/week    1 Glasses of wine per week     wine on the weekends  . Drug Use: No  . Sexually Active: Yes    Birth Control/ Protection: Surgical   Other Topics Concern  . Not on file   Social History Narrative  . No narrative on file   Family History  Problem Relation Age of Onset  . Heart disease Father   . Hypertension Father   . Cancer Father   . Heart disease Brother   . Hypertension Brother   . Cancer Brother   . Diabetes Maternal Grandfather   . Arthritis Mother    Patient Active Problem List  Diagnosis  . BIPOLAR DISORDER UNSPECIFIED  . ABDOMINAL PAIN, LEFT LOWER QUADRANT  . Herpes progenitalis  . Osteoarthritis  . DJD (degenerative joint disease)  . Other and unspecified hyperlipidemia  . Menopause  . Spinal stenosis  . History of Bell's palsy  . Depression   Current Outpatient Prescriptions on  File Prior to Visit  Medication Sig Dispense Refill  . acyclovir (ZOVIRAX) 200 MG capsule Take 2 capsules (400 mg total) by mouth every morning.  30 capsule  12  . fish oil-omega-3 fatty acids 1000 MG capsule Take 1 g by mouth daily.      Marland Kitchen lamoTRIgine (LAMICTAL) 200 MG tablet Take 200 mg by mouth daily.      . sertraline (ZOLOFT) 50 MG tablet Take 100 mg by mouth daily.       . calcium carbonate (OS-CAL) 600 MG TABS Take 600 mg by mouth daily.          Review of Systems    see HPI Objective:   Physical Exam Physical Exam  Nursing note and vitals reviewed.  Constitutional: She is oriented to person, place, and time. She appears well-developed and well-nourished.  HENT:  Head: Normocephalic and atraumatic.  Cardiovascular: Normal rate and regular rhythm. Exam reveals no gallop and no friction rub.  No murmur heard.  Pulmonary/Chest: Breath sounds normal. She has no wheezes. She has no rales.  Neurological: She is alert and oriented to person, place, and time.  Skin: Skin is warm and dry.  Psychiatric: She has a  normal mood and affect. Her behavior is normal.         Assessment & Plan:  Acute worsening clinical depression  I spoke with pts. husband Ann Mendez and the [facility] where he will transport pt.  To have behavioral assessment. Pt is agreeable .Marland Kitchen Options include  Intensive OP treatment versus hospitalizaiton.  Husband arrived and drove pt to [facility] eR.  Triage nurse notified

## 2011-11-11 NOTE — BHH Counselor (Signed)
Patient's nurse Morrie Sheldon asked me to come and speak with patient about their discharge from the ED. Sts that patient was not happy about being discharged and wanted further advice about treatment. Reviewed patient's information and learned that patient was evaluated by the EDP-Dr. Estell Harpin, medically cleared, telepsych was ordered, and completed. Telepsych recommended discharge home.  Apparently the EDP didn't feel that ACT needed to be involved in this patient's disposition. ACT was not called to assess this patient. I agreed to speak with both patient and her spouse (both individuals were very pleasant). The spouse sts that he spoke to [facility] person) at [facility] seeking guidance as he felt his spouse was not given good direction in the ED by telepsych. Sts that he spoke to [place] and she suggested Psych IOP. Patient appeared very interested in this program. I provided them with detailed information about this program. The patient agreed to participate in the program and appeared motivated for treatment.   I explained to the patient that she must stay and complete an assessment. Explained to her that the assessment must be completed in order to appropriately refer her to the program. Patient inquired about how long before completing the assessment. I explained that I would not be able to complete the assessment during my shift, however; the following shift would complete their assessment promptly. The spouse asked if they could get this assessment completed across the street at [facility]. I informed him "yes", however made both individuals aware that they were welcome to wait here in the ED for a assessment to be completed. Patient stated that she did not want to wait and would go across the street after discharged from here.   *Contacted the assessment office and spoke to [place]. I provided him with the above information and he stated they were not busy. Patient discharged to follow up across the street for a  assessment to start the Psych IOP program.

## 2011-11-11 NOTE — ED Notes (Signed)
Telepsych in progress. 

## 2011-11-11 NOTE — BH Assessment (Signed)
Assessment Note   Ann Mendez is an 51 y.o. female. Pt presents as walk in to [facility], accompanied by husband. Pt is tearful, depressed and c/o feeling very dark and empty inside. Feels guilty because she can't work and makes promises and then has to call in sick, because she is impulsive, nauseous, and sobbing. Pt went to ED and was seen by telepsychiatry MD who told her to go home (no criteria for inpatient?). Pt and husband felt abandoned and hopeless. Called back to Assessment in ED and encouraged to come in for IOP assessment. Pt has been very stable on medications for her Bipolar 2 d/o for years. 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 Pt has benefited from group therapy in past and is willing to try IOP, or anything, to relieve these symptoms. Pt does not have SI/HI or psychosis and denies these in the past. Pt has had OP counseling on and off since 1993 when she went to marital counseling during 1st marriage. Message left for Psychiatric IOP. Pt and husband aware they can return at any time if symptoms worsen.  Axis I: Bipolar 2 d/o 296.89 Axis II: No diagnosis Axis III:  Past Medical History  Diagnosis Date  . Herpes progenitalis   . Osteoarthritis   . Bipolar 1 disorder   . Depression    Axis IV: occupational problems, other psychosocial or environmental problems and problems with access to health care services Axis V: 41-50 serious symptoms  Past Medical History:  Past Medical History  Diagnosis Date  . Herpes progenitalis   . Osteoarthritis   . Bipolar 1 disorder   . Depression     Past Surgical History  Procedure Date  . Tubal ligation   . Cesarean section     Family History:  Family History  Problem Relation Age of Onset  . Heart  disease Father   . Hypertension Father   . Cancer Father   . Heart disease Brother   . Hypertension Brother   . Cancer Brother   . Diabetes Maternal Grandfather   . Arthritis Mother     Social History:  reports that she has never smoked. She has never used smokeless tobacco. She reports that she drinks about .6 ounces of alcohol per week. She reports that she does not use illicit drugs.  Additional Social History:  Alcohol / Drug Use Pain Medications: not abusing Prescriptions: not abusing Over the Counter: not abusing History of alcohol / drug use?: No history of alcohol / drug abuse  CIWA:   COWS:    Allergies:  Allergies  Allergen Reactions  . Sulfonamide Derivatives     Unknown reaction.    Home Medications:  (Not in a hospital admission)  OB/GYN Status:  Patient's last menstrual period was 10/16/2011.  General Assessment Data Location of Assessment: [facility] Assessment Services Living Arrangements: Spouse/significant other Can pt return to current living arrangement?: Yes Admission Status: Voluntary Is patient capable of signing voluntary admission?: Yes Transfer from: Home Referral Source:  (see narative)  Education Status Is patient currently in school?: No  Risk to self Suicidal Ideation: No Suicidal Intent: No Is patient at risk for suicide?: No Suicidal Plan?: No Access to Means: No What has been your use of drugs/alcohol within  the last 12 months?: occasional alcohol Previous Attempts/Gestures: No How many times?: 0  Intentional Self Injurious Behavior: None Family Suicide History: No Recent stressful life event(s): Job Loss (6 job changes in 2 years) Persecutory voices/beliefs?: No Depression: Yes Depression Symptoms: Despondent;Tearfulness;Fatigue;Guilt;Loss of interest in usual pleasures;Feeling worthless/self pity Substance abuse history and/or treatment for substance abuse?: No Suicide prevention information given to non-admitted patients:  Yes  Risk to Others Homicidal Ideation: No Thoughts of Harm to Others: No Current Homicidal Intent: No Current Homicidal Plan: No Access to Homicidal Means: No History of harm to others?: No Assessment of Violence: None Noted Does patient have access to weapons?: No Criminal Charges Pending?: No Does patient have a court date: No  Psychosis Hallucinations: None noted Delusions: None noted  Mental Status Report Appear/Hygiene:  (casual) Eye Contact: Good Motor Activity: Unremarkable Speech: Soft;Logical/coherent Level of Consciousness: Alert Mood: Depressed;Anxious;Apprehensive;Empty Affect: Depressed Anxiety Level: None Thought Processes: Coherent;Relevant Judgement: Unimpaired Orientation: Person;Place;Time;Situation Obsessive Compulsive Thoughts/Behaviors: None  Cognitive Functioning Concentration: Decreased Memory: Recent Intact;Remote Intact IQ: Average Insight: Fair Impulse Control: Fair Appetite: Fair Weight Loss: 0  Weight Gain: 0  Sleep: Decreased Total Hours of Sleep:  (not restful) Vegetative Symptoms: None  ADLScreening [facility] Assessment Services) Patient's cognitive ability adequate to safely complete daily activities?: Yes Patient able to express need for assistance with ADLs?: Yes Independently performs ADLs?: Yes (appropriate for developmental age)  Abuse/Neglect [facility]) Physical Abuse: Denies Verbal Abuse: Denies Sexual Abuse: Denies  Prior Inpatient Therapy Prior Inpatient Therapy: No  Prior Outpatient Therapy Prior Outpatient Therapy: Yes Prior Therapy Dates: for years, current Prior Therapy Facilty/Provider(s): [facility] (yrs ago: codependant, theological training, marital) Reason for Treatment: depression/ Bipolar 2  ADL Screening (condition at time of admission) Patient's cognitive ability adequate to safely complete daily activities?: Yes Patient able to express need for assistance with ADLs?: Yes Independently performs  ADLs?: Yes (appropriate for developmental age) Weakness of Legs: None Weakness of Arms/Hands: None  Home Assistive Devices/Equipment Home Assistive Devices/Equipment: None    Abuse/Neglect Assessment (Assessment to be complete while patient is alone) Physical Abuse: Denies Verbal Abuse: Denies Sexual Abuse: Denies Exploitation of patient/patient's resources: Denies Self-Neglect: Denies       Nutrition Screen- [facility] Adult/WL/AP Patient's home diet: Regular Have you recently lost weight without trying?: No Have you been eating poorly because of a decreased appetite?: No Malnutrition Screening Tool Score: 0   Additional Information 1:1 In Past 12 Months?: No CIRT Risk: No Elopement Risk: No Does patient have medical clearance?: Yes     Disposition:  Disposition Disposition of Patient: Outpatient treatment Type of outpatient treatment: Psych Intensive Outpatient  On Site Evaluation by:   Reviewed with Physician:     Conan Bowens 11/11/2011 8:54 PM

## 2011-11-11 NOTE — ED Notes (Signed)
Holding discharge for now

## 2011-11-11 NOTE — ED Notes (Signed)
Patient reports that she has a history of Bipolar depression, but an acute episode that began 10 days ago. Patient reports that she was given hormone treatment for menopause and feels like it has increased her depression and the patient has had a recent job change. Patient denies SI/HI. Patient states that Dr. Constance Goltz wanted her to come to the ED and be evaluated for possible inpatient treatment if needed.

## 2011-11-11 NOTE — ED Notes (Signed)
Pt and family spoke with [place]- information for follow up given.  Pt and family plan to go to [facility] for  IOP assessment

## 2011-11-11 NOTE — Patient Instructions (Signed)
To [facility] ER  Husband to transport

## 2011-11-11 NOTE — ED Notes (Signed)
telepsych faxed and called 
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
## 2011-11-13 ENCOUNTER — Encounter (HOSPITAL_COMMUNITY): Payer: Self-pay

## 2011-11-13 ENCOUNTER — Other Ambulatory Visit (HOSPITAL_COMMUNITY): Payer: 59 | Attending: Psychiatry

## 2011-11-13 DIAGNOSIS — F332 Major depressive disorder, recurrent severe without psychotic features: Secondary | ICD-10-CM | POA: Insufficient documentation

## 2011-11-13 DIAGNOSIS — F339 Major depressive disorder, recurrent, unspecified: Secondary | ICD-10-CM

## 2011-11-13 DIAGNOSIS — F411 Generalized anxiety disorder: Secondary | ICD-10-CM

## 2011-11-13 NOTE — Progress Notes (Unsigned)
    Daily Group Progress Note  Program: IOP  Group Time: 9:00-10:30 am    Participation Level: Minimal  Behavioral Response: Appropriate  Type of Therapy:  Process Group  Summary of Progress: Today was patients first day in the group. She was attentive and observed for most of the group, but then connected by sharing that she identified with several members in that she is not happy with her career and wants to find something that feels more like what she should be doing in life.      Group Time: 10:30 am - 12:00 pm   Participation Level:  Active  Behavioral Response: Appropriate  Type of Therapy: Psycho-education Group  Summary of Progress:  Patient participated in a goodbye ceremony, discussed the progress the member has made and said goodbye.  Maxcine Ham, MSW, LCSW

## 2011-11-14 ENCOUNTER — Telehealth: Payer: Self-pay | Admitting: *Deleted

## 2011-11-14 ENCOUNTER — Other Ambulatory Visit (HOSPITAL_COMMUNITY): Payer: 59 | Attending: Psychiatry

## 2011-11-14 DIAGNOSIS — M1990 Unspecified osteoarthritis, unspecified site: Secondary | ICD-10-CM | POA: Insufficient documentation

## 2011-11-14 DIAGNOSIS — G4700 Insomnia, unspecified: Secondary | ICD-10-CM | POA: Insufficient documentation

## 2011-11-14 DIAGNOSIS — A6000 Herpesviral infection of urogenital system, unspecified: Secondary | ICD-10-CM | POA: Insufficient documentation

## 2011-11-14 DIAGNOSIS — F411 Generalized anxiety disorder: Secondary | ICD-10-CM | POA: Insufficient documentation

## 2011-11-14 DIAGNOSIS — F332 Major depressive disorder, recurrent severe without psychotic features: Secondary | ICD-10-CM | POA: Insufficient documentation

## 2011-11-14 MED ORDER — ESCITALOPRAM OXALATE 20 MG PO TABS: 20.0000 mg | ORAL_TABLET | Freq: Every day | ORAL | Status: DC

## 2011-11-14 NOTE — Progress Notes (Unsigned)
    Daily Group Progress Note  Program: IOP  Group Time: 9:00-10:30 am   Participation Level: Active  Behavioral Response: Appropriate  Type of Therapy:  Group Therapy  Summary of Progress: Patient shared her depression associated with working in a job that she hates and making impulsive decisions regarding job changes that cause her to be angry with herself. She described depression symptoms and feeling alone. Members connected with her and related.      Group Time: 10:30 am - 12:00 pm   Participation Level:  Active  Behavioral Response: Appropriate  Type of Therapy: Psycho-education Group  Summary of Progress: Patient participated in a skills group on feelings and causes. They discussed how to manage difficult feelings and normalized difficult feelings.  Maxcine Ham, MSW, LCSW
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
## 2011-11-14 NOTE — Telephone Encounter (Signed)
Courtesy call: left message

## 2011-11-17 ENCOUNTER — Other Ambulatory Visit (HOSPITAL_COMMUNITY): Payer: 59 | Admitting: Psychiatry

## 2011-11-17 DIAGNOSIS — F319 Bipolar disorder, unspecified: Secondary | ICD-10-CM

## 2011-11-18 ENCOUNTER — Other Ambulatory Visit (HOSPITAL_COMMUNITY): Payer: 59 | Admitting: Psychiatry

## 2011-11-18 DIAGNOSIS — F319 Bipolar disorder, unspecified: Secondary | ICD-10-CM
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
## 2011-11-18 NOTE — Progress Notes (Signed)
Patient ID: Ann Mendez, female   DOB: [birth year], 51 y.o.   MRN: [id number] Pt seen has severe anxiety in am. Has a script for Xanax from Dr Nolen Mu    ,I recommend she take xanax 0.5 mg po q hs.and continue the lexapro 20 mg po q hs.

## 2011-11-18 NOTE — Progress Notes (Unsigned)
    Daily Group Progress Note  Program: IOP  Group Time: 9:00-10:30 am   Participation Level: Minimal  Behavioral Response: Appropriate  Type of Therapy:  Process Group  Summary of Progress: Patient appeared di shelved today, as was different from yesterday. She did not have any make-up on and her hair appeared uncombed. She reported high depression and was tearful today. She continues to struggle with being unhappy in her current career and feeling trapped. She talked of feeling like a "failure" for changing jobs so many times and for her tendency to make impulsive decisions regarding job changes that later embarrass her.       Group Time: 10:30 am - 12:00 pm   Participation Level:  Active  Behavioral Response: Appropriate  Type of Therapy: Psycho-education Group  Summary of Progress: Patient participated in an activity that promoted fun, laughter, distraction as well as allowing the group to get to know each other better. Patient admitted that it was enjoyable to laugh and be distracted from depression symptoms for a period of time. This promoted the ability to feel enjoyment during a depressive episode and teach the skills of distraction to manage symptoms.   Carman Ching, LCSW

## 2011-11-18 NOTE — Progress Notes (Signed)
    Daily Group Progress Note  Program: IOP  Group Time: 9:00-10:30 am   Participation Level: Active  Behavioral Response: Appropriate  Type of Therapy:  Process Group  Summary of Progress: Patient described her struggle with having Bipolar Disorder and her how she has been impulsive with changing jobs over the past several years. 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 She described how she enrolled in school for this years ago but had to stop when one of her daughters became sick with Lupus. She now feels financial restraints prevent her from seeking out this career path. She identifies her job as her main source of stress.      Group Time: 10:30 am - 12:00 pm   Participation Level:  Active  Behavioral Response: Appropriate  Type of Therapy: Psycho-education Group  Summary of Progress:  Patient participated in a group focused on grief and loss and explored current losses and barriers to affective grieving.   Carman Ching, LCSW

## 2011-11-19 ENCOUNTER — Other Ambulatory Visit (HOSPITAL_COMMUNITY): Payer: 59

## 2011-11-20 ENCOUNTER — Other Ambulatory Visit (HOSPITAL_COMMUNITY): Payer: 59

## 2011-11-21 ENCOUNTER — Other Ambulatory Visit (HOSPITAL_COMMUNITY): Payer: 59

## 2011-11-21 NOTE — Progress Notes (Unsigned)
    Daily Group Progress Note  Program: IOP  Group Time: 9:00-10:30 am   Participation Level: Active  Behavioral Response: Appropriate  Type of Therapy:  Process Group  Summary of Progress: Summary: Patient reports having many regrets and wishes she had made better decisions in life. She feels that if she had made better decisions that her life would be different. She continues to struggle with her depression and dissatisfaction in her career.      Group Time: 10:30 am - 12:00 pm   Participation Level:  Active  Behavioral Response: Appropriate  Type of Therapy: Education and Training Group  Summary of Progress: Patient participated in muscle relaxation meditation.   Maxcine Ham, MSW, LCSW

## 2011-11-21 NOTE — Progress Notes (Unsigned)
    Daily Group Progress Note  Program: IOP  Group Time: 9:00-10:30 am   Participation Level: Active  Behavioral Response: Appropriate  Type of Therapy:  Process Group  Summary of Progress: Patient reports medium depression and anxiety and continues to process stress associated with being unsatisfied in her career.      Group Time: 10:30 am - 12:00 pm   Participation Level:  Active  Behavioral Response: Appropriate  Type of Therapy: Psycho-education Group  Summary of Progress: Patient participated in a discussion on how to access mental health support groups during and after their time in the program to manage depression and anxiety symptoms and maintain continued wellness.  Maxcine Ham, MSW, LCSW

## 2011-11-21 NOTE — Progress Notes (Unsigned)
    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 She feels her job is not fulfilling and expressed a desire to seek alternate employment. She has not identified steps yet for making a career change but is doing well with expressesing feelings and causes for anxiety.      Group Time: 10:30 am - 12:00 pm   Participation Level:  Active  Behavioral Response: Appropriate  Type of Therapy: Psycho-education Group  Summary of Progress: Patient learned about the "change cycle" and the process people go through when contemplating making a change. Patient related this to their main reason for entering treatment and identified their current place on the change cycle.  Maxcine Ham, MSW, LCSW

## 2011-11-24 ENCOUNTER — Telehealth (HOSPITAL_COMMUNITY): Payer: Self-pay | Admitting: Psychiatry

## 2011-11-24 ENCOUNTER — Other Ambulatory Visit (HOSPITAL_COMMUNITY): Payer: 59

## 2011-11-25 ENCOUNTER — Other Ambulatory Visit (HOSPITAL_COMMUNITY): Payer: 59 | Admitting: Psychiatry

## 2011-11-25 DIAGNOSIS — F319 Bipolar disorder, unspecified: Secondary | ICD-10-CM

## 2011-11-25 NOTE — Progress Notes (Signed)
    Daily Group Progress Note  Program: IOP  Group Time: 9:00-10:30 am  Participation Level: Active  Behavioral Response: Appropriate  Type of Therapy:  Process Group  Summary of Progress: Patient reports still having high anxiety, especially in the morning, and is struggling to initially identify the cause as work stress, but while talking she is able to realize if she did not have to return to a job she disliked, she probably would not have the anxiety in the morning. She is still unsure what she plans to do with her decision to return to work, but states that the time off has helped her reduce her anxiety some so she can think more clearly about her options.      Group Time: 10:30 am - 12:00 pm   Participation Level:  Active  Behavioral Response: Appropriate  Type of Therapy: Psycho-education Group  Summary of Progress: Patient learned about the symptoms of depression as a clinical illness and discussed personal symptoms they are experiencing and how to become aware of them in case they return.   Carman Ching, LCSW

## 2011-11-26 ENCOUNTER — Other Ambulatory Visit (HOSPITAL_COMMUNITY): Payer: 59 | Admitting: Psychiatry

## 2011-11-26 DIAGNOSIS — F319 Bipolar disorder, unspecified: Secondary | ICD-10-CM

## 2011-11-26 NOTE — Progress Notes (Signed)
    Daily Group Progress Note  Program: IOP  Group Time: 9:00-10:30 am   Participation Level: Active  Behavioral Response: Appropriate  Type of Therapy:  Process Group  Summary of Progress: Patient reports feeling "good" today. She provided empathy and support to others. She talked about her mother and how she focused on helping others instead of healing herself. 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 Patient still is uncertain what she plans to do about returning to work, but is gaining tools to better manage anxiety symptoms as they occur.      Group Time: 10:30 am - 12:00 pm   Participation Level:  Active  Behavioral Response: Appropriate  Type of Therapy: Psycho-education Group  Summary of Progress: Patient was educated on anxiety and bipolar disorder to better understand the symptoms and how to identify them when they are occuring.   Carman Ching, LCSW

## 2011-11-27 ENCOUNTER — Other Ambulatory Visit (HOSPITAL_COMMUNITY): Payer: 59 | Admitting: Psychiatry

## 2011-11-27 DIAGNOSIS — F319 Bipolar disorder, unspecified: Secondary | ICD-10-CM

## 2011-11-28 ENCOUNTER — Other Ambulatory Visit (HOSPITAL_COMMUNITY): Payer: 59 | Admitting: Psychiatry

## 2011-11-28 DIAGNOSIS — F319 Bipolar disorder, unspecified: Secondary | ICD-10-CM

## 2011-11-28 NOTE — Progress Notes (Signed)
    Daily Group Progress Note  Program: IOP  Group Time: 9:00-10:30 am   Participation Level: Active  Behavioral Response: Appropriate  Type of Therapy:  Process Group  Summary of Progress: Patient reports high anxiety today associated with fear of putting her mom in a nursing home and insecurities she is realizing she has regarding being around others due to fear of judgments they might make of her. She was challenged in her perceptions of herself by group members and self-esteem skills were used to increase her sense of self-worth.       Group Time: 10:30 am - 12:00 pm  Participation Level:  Active  Behavioral Response: Appropriate  Type of Therapy: Psycho-education Group  Summary of Progress: Patient learned the science behind aromatherapy as a tool to manage stress and depression as well as how to use the scents in the room at home and in work settings for continued stress management.   Carman Ching, LCSW

## 2011-11-28 NOTE — Progress Notes (Signed)
    Daily Group Progress Note  Program: IOP  Group Time: 9:00-10:30 am   Participation Level: Active  Behavioral Response: Appropriate  Type of Therapy:  Process Group  Summary of Progress: Patient expressed concern about ending the group on Monday and leaving the support she has gained through the group members. She expressed this anxiety to the group and case manager and created a plan to help her feel less anxious about ending through scheduling and time management of her discharge paperwork and with meeting with the doctor. She is presenting herself as the "caretaker of everyone else" in the group. When a member is in crisis, she is the first to go to them and when talking today she talked more about others than about herself, but when redirected back to focus on herself, she is able to. She said she plans on leaving her current job and does not feel it is an impulsive decision because she has been talking about it with her family and thinking about it since starting the group. She is less impulsive and is aware of this tendency to act first and think later.      Group Time: 10:30 am - 12:00 pm   Participation Level:  Active  Behavioral Response: Appropriate  Type of Therapy: Psycho-education Group  Summary of Progress: Patient participated in her goodbye ceremony and had closure with the group members and talked about how she feels she has benefited from the program.   Carman Ching, LCSW

## 2011-12-01 ENCOUNTER — Other Ambulatory Visit (HOSPITAL_COMMUNITY): Payer: 59 | Admitting: Psychiatry

## 2011-12-01 DIAGNOSIS — F319 Bipolar disorder, unspecified: Secondary | ICD-10-CM

## 2011-12-01 NOTE — Patient Instructions (Addendum)
Patient will follow up with Dr. Nolen Mu on 12-04-11 @ 10:15 a.m and Areta Haber, [facility] @ 12-02-11 @ 11 a.m..  Encouraged support groups.

## 2011-12-01 NOTE — Progress Notes (Signed)
    Daily Group Progress Note  Program: IOP  Group Time: 9:00-10:30 am   Participation Level: Active  Behavioral Response: Appropriate  Type of Therapy:  Process Group  Summary of Progress: Today was patients final day in the group. She provided an update on her progress in treatment and participated in a goodbye ceremony to the members. She initially entered treatment due to high anxiety symptoms associated with work stress. 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 She did work on her impulsivity and on problem solution skills to help her make that decision. She reports a decrease in depression and anxiety but states she still struggles with strong anxiety in the morning. She made this a goal she plans on continuing in individual therapy.       Group Time: 10:30 am - 12:00 pm   Participation Level:  Active  Behavioral Response: Appropriate  Type of Therapy: Psycho-education Group  Summary of Progress: Patient participated in a goodbye ceremony and practiced the skill of healthy closure and expressing feelings of sadness associated with loss.   Carman Ching, LCSW

## 2011-12-01 NOTE — Progress Notes (Signed)
  [facility] Intensive Outpatient Program Discharge Summary  Ann Mendez [id number]  Discharge Note  Patient:  Ann Mendez is an 51 y.o., female DOB:  Jul 29, 1960  Date of Admission:  10 /31/13  Date of Discharge:  11 / 18/ 13  Reason for Admission:Depression and anxiety  Hospital Course: Patient was admitted to IOP, she was continued on Lamictal 200 mg every day and her Zoloft 50 mg was continued and she was started on Lexapro 20 mg for her depression. Subsequently her Zoloft was discontinued. The patient did well she also began taking Xanax 1 mg by mouth q. at bedtime which had been prescribed by Dr. Nolen Mu and this helped her anxiety and her insomnia. She was able to express all of her concerns and the lip of problems and learnt multiple coping skills. Sleep and appetite were good mood was significantly improved with no SI or HI. She was coping well and was tolerating her medications for  Mental Status at Discharge: Alert, oriented x3, affect was bright mood was euthymic speech was normal. No suicidal or homicidal ideation was present. No hallucinations or delusions. Rescinded her remote memory was good, judgment and insight was good. The concentration and recall are good.  Lab Results: No results found for this or any previous visit (from the past 48 hour(s)).  Current outpatient prescriptions:acyclovir (ZOVIRAX) 200 MG capsule, Take 2 capsules (400 mg total) by mouth every morning., Disp: 30 capsule, Rfl: 12;  calcium carbonate (OS-CAL) 600 MG TABS, Take 600 mg by mouth daily., Disp: , Rfl: ;  cholecalciferol (VITAMIN D) 1000 UNITS tablet, Take 1,000 Units by mouth daily., Disp: , Rfl: ;  escitalopram (LEXAPRO) 20 MG tablet, Take 1 tablet (20 mg total) by mouth daily., Disp: 30 tablet, Rfl: 0 fish oil-omega-3 fatty acids 1000 MG capsule, Take 1 g by mouth daily., Disp: , Rfl: ;  lamoTRIgine (LAMICTAL) 200 MG tablet, Take 200 mg by mouth daily., Disp: , Rfl: ;   Multiple Vitamin (MULTIVITAMIN WITH MINERALS) TABS, Take 1 tablet by mouth daily., Disp: , Rfl:   Axis Diagnosis:   Axis I: Anxiety Disorder NOS and Major Depression, Recurrent severe Axis II: Deferred Axis III:  Past Medical History  Diagnosis Date  . Herpes progenitalis   . Osteoarthritis   . Bipolar 1 disorder   . Depression    Axis IV: economic problems, occupational problems, problems related to social environment and problems with primary support group Axis V: 61-70 mild symptoms   Level of Care:  OP  Discharge destination:  Home  Is patient on multiple antipsychotic therapies at discharge:  No    Has Patient had three or more failed trials of antipsychotic monotherapy by history:  No  Patient phone:  [phone] (home)  Patient address:   [address] [place] [address],   Follow-up recommendations:  Activity as tolerated.    Diet regular. Followup with Dr. Nolen Mu for medications and Karie Kirks . And then see for followup  Comments:   The patient received suicide prevention pamphlet:  No Belongings returned:    Margit Banda 12/01/2011, 11:27 AM   [facility] 12/01/2011

## 2011-12-01 NOTE — Progress Notes (Signed)
Patient ID: Ann Mendez, female   DOB: 03/29/1960, 51 y.o.   MRN: [id number] D:  This is a 51 yr old married caucasian female, referred by psychiatrist (Dr. Nolen Mu), treatment for increased anxiety and depression. Denies any SI/HI or A/V hallucinations. Pt reports feeling depressed for several years, but worsening over the past two months. Also, reports a family history of depression. 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 Has had six jobs in last two years and is never happy with them. 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 2) Finances: Pt reports that her family needs her income to survive. Reports having multiple hospital bills due to daughter's epilepsy. 3) Pt states menopause is causing increased depression. She has had several different medications for menopause and all has increased her depression.  Patient has attended all scheduled days and reports decreased depression and anxiety.  Reports improved appetite and concentration.  Reports difficulty with motivating herself to do things.  Denies any SI/HI or A/V hallucinations.  States she feels like her old self in the afternoons and evenings, but continues to struggle in the mornings.  C/O early awakenings each morning due to severe anxiety.  States she takes half of Xanax each morning when this happens.  Reports that the groups were helpful.  A:  D/C today.  F/U with Areta Haber, CNS on 12-02-11 @ 11 a.m and Dr. Nolen Mu on 12-04-11 at 10:15 a.m.  Encouraged support groups.  R:  Pt receptive.

## 2011-12-02 ENCOUNTER — Other Ambulatory Visit (HOSPITAL_COMMUNITY): Payer: 59

## 2011-12-03 ENCOUNTER — Encounter: Payer: Self-pay | Admitting: Internal Medicine

## 2011-12-03 ENCOUNTER — Other Ambulatory Visit (HOSPITAL_COMMUNITY): Payer: 59

## 2011-12-03 ENCOUNTER — Ambulatory Visit (INDEPENDENT_AMBULATORY_CARE_PROVIDER_SITE_OTHER): Payer: 59 | Admitting: Internal Medicine

## 2011-12-03 VITALS — BP 129/73 | HR 80 | Temp 97.2°F | Resp 16 | Wt 108.0 lb

## 2011-12-03 DIAGNOSIS — F329 Major depressive disorder, single episode, unspecified: Secondary | ICD-10-CM

## 2011-12-03 DIAGNOSIS — F319 Bipolar disorder, unspecified: Secondary | ICD-10-CM

## 2011-12-03 DIAGNOSIS — Z780 Asymptomatic menopausal state: Secondary | ICD-10-CM

## 2011-12-03 DIAGNOSIS — F3289 Other specified depressive episodes: Secondary | ICD-10-CM

## 2011-12-03 NOTE — Progress Notes (Signed)
Subjective:    Patient ID: Ann Mendez, female    DOB: 03/22/1960, 51 y.o.   MRN: [id number]  HPI  Ann Mendez is here for follow up.  She has finished her IOP program and is feeling much better.  Her IOP psychiatrist stopped her Zoloft  and she is now on Lexapro.  She will be seeing Dr. Nolen Mu tomorrow  She is 75 % improved.  She is wondering if her hormones are contributing to her emotions.  She  Was placed on HT by Dr. Oletha Blend and she reports that her depression worsened.   " I lasted about 10 days".    She has a rare hot flush    Allergies  Allergen Reactions  . Sulfonamide Derivatives     Unknown reaction.   Past Medical History  Diagnosis Date  . Herpes progenitalis   . Osteoarthritis   . Bipolar 1 disorder   . Depression    Past Surgical History  Procedure Date  . Tubal ligation   . Cesarean section    History   Social History  . Marital Status: Married    Spouse Name: N/A    Number of Children: N/A  . Years of Education: N/A   Occupational History  . Not on file.   Social History Main Topics  . Smoking status: Never Smoker   . Smokeless tobacco: Never Used  . Alcohol Use: 0.6 oz/week    1 Glasses of wine per week     Comment: wine on the weekends  . Drug Use: No  . Sexually Active: Yes    Birth Control/ Protection: Surgical   Other Topics Concern  . Not on file   Social History Narrative  . No narrative on file   Family History  Problem Relation Age of Onset  . Heart disease Father   . Hypertension Father   . Cancer Father   . Heart disease Brother   . Hypertension Brother   . Cancer Brother   . Alcohol abuse Brother   . Diabetes Maternal Grandfather   . Arthritis Mother   . Bipolar disorder Mother   . Anxiety disorder Mother   . Depression Mother   . Depression Sister   . Bipolar disorder Maternal Grandmother   . Alcohol abuse Maternal Grandmother   . Depression Maternal Grandmother    Patient Active Problem List  Diagnosis  .  BIPOLAR DISORDER UNSPECIFIED  . ABDOMINAL PAIN, LEFT LOWER QUADRANT  . Herpes progenitalis  . Osteoarthritis  . DJD (degenerative joint disease)  . Other and unspecified hyperlipidemia  . Menopause  . Spinal stenosis  . History of Bell's palsy  . Depression   Current Outpatient Prescriptions on File Prior to Visit  Medication Sig Dispense Refill  . acyclovir (ZOVIRAX) 200 MG capsule Take 2 capsules (400 mg total) by mouth every morning.  30 capsule  12  . cholecalciferol (VITAMIN D) 1000 UNITS tablet Take 1,000 Units by mouth daily.      Marland Kitchen escitalopram (LEXAPRO) 20 MG tablet Take 1 tablet (20 mg total) by mouth daily.  30 tablet  0  . fish oil-omega-3 fatty acids 1000 MG capsule Take 1 g by mouth daily.      Marland Kitchen lamoTRIgine (LAMICTAL) 200 MG tablet Take 200 mg by mouth daily.      . Multiple Vitamin (MULTIVITAMIN WITH MINERALS) TABS Take 1 tablet by mouth daily.      . calcium carbonate (OS-CAL) 600 MG TABS Take 600 mg by mouth daily.  Review of Systems see HPI   Objective:   Physical Exam  Physical Exam  Nursing note and vitals reviewed.  Constitutional: She is oriented to person, place, and time. She appears well-developed and well-nourished.  HENT:  Head: Normocephalic and atraumatic.  Cardiovascular: Normal rate and regular rhythm. Exam reveals no gallop and no friction rub.  No murmur heard.  Pulmonary/Chest: Breath sounds normal. She has no wheezes. She has no rales.  Neurological: She is alert and oriented to person, place, and time.  Skin: Skin is warm and dry.  Psychiatric: She has a normal mood and affect. Her behavior is normal. Smiling today  Affect less flat        Assessment & Plan:  Depression  Continue current meds.    Menopause  Will check E/P and FSH.  I gave pt handout of risk/benefit sheet of HT

## 2011-12-04 ENCOUNTER — Other Ambulatory Visit (HOSPITAL_COMMUNITY): Payer: 59

## 2011-12-04 LAB — PROGESTERONE: Progesterone: 1.1 ng/mL

## 2011-12-04 LAB — FOLLICLE STIMULATING HORMONE: FSH: 126.9 m[IU]/mL — ABNORMAL HIGH

## 2011-12-09 ENCOUNTER — Encounter: Payer: Self-pay | Admitting: *Deleted

## 2011-12-09 ENCOUNTER — Telehealth: Payer: Self-pay | Admitting: *Deleted

## 2011-12-09 LAB — ESTROGENS, TOTAL: Estrogen: 82.5 pg/mL

## 2011-12-09 NOTE — Telephone Encounter (Signed)
Labs mailed to pt home address.

## 2011-12-09 NOTE — Telephone Encounter (Signed)
Left message regarding recent lab work

## 2011-12-09 NOTE — Telephone Encounter (Signed)
Message copied by Mathews Robinsons on Tue Dec 09, 2011 11:50 AM ------      Message from: Raechel Chute D      Created: Sun Dec 07, 2011  3:58 PM       Karen Kitchens            Call pt and let her know that her hromone levels indicate she is in menopause.  I would not recommend hormones at this time (we discussed this is office).              Ok to mail labs to her

## 2011-12-14 ENCOUNTER — Other Ambulatory Visit: Payer: Self-pay | Admitting: Obstetrics and Gynecology

## 2012-03-20 ENCOUNTER — Other Ambulatory Visit: Payer: Self-pay | Admitting: Obstetrics and Gynecology

## 2012-08-09 ENCOUNTER — Telehealth: Payer: Self-pay | Admitting: *Deleted

## 2012-08-09 NOTE — Telephone Encounter (Signed)
Pt called and left message requesting medication f depression? I called patient back unable to leave message due to vm note set up. Will try back later.

## 2012-08-10 ENCOUNTER — Encounter: Payer: Self-pay | Admitting: Gynecology

## 2012-08-10 ENCOUNTER — Ambulatory Visit (INDEPENDENT_AMBULATORY_CARE_PROVIDER_SITE_OTHER): Payer: 59 | Admitting: Gynecology

## 2012-08-10 VITALS — BP 120/74 | Ht 62.5 in | Wt 111.0 lb

## 2012-08-10 DIAGNOSIS — Z01419 Encounter for gynecological examination (general) (routine) without abnormal findings: Secondary | ICD-10-CM

## 2012-08-10 DIAGNOSIS — N951 Menopausal and female climacteric states: Secondary | ICD-10-CM

## 2012-08-10 MED ORDER — ACYCLOVIR 200 MG PO CAPS: ORAL_CAPSULE | ORAL | Status: DC

## 2012-08-10 NOTE — Patient Instructions (Signed)
Call me in 2 weeks in followup of starting the estrogen patch.

## 2012-08-10 NOTE — Progress Notes (Addendum)
Ann Mendez [facility] 06-08-1960 [id number]        52 y.o.  [id number] for annual exam.  Former patient of Dr. Eda Paschal. Several issues noted below.  Past medical history,surgical history, medications, allergies, family history and social history were all reviewed and documented in the EPIC chart.  ROS:  Performed and pertinent positives and negatives are included in the history, assessment and plan .  Exam: Kim assistant Filed Vitals:   08/10/12 1354  BP: 120/74  Height: 5' 2.5" (1.588 m)  Weight: 111 lb (50.349 kg)   General appearance  Normal Skin grossly normal Head/Neck normal with no cervical or supraclavicular adenopathy thyroid normal Lungs  clear Cardiac RR, without RMG Abdominal  soft, nontender, without masses, organomegaly or hernia Breasts  examined lying and sitting without masses, retractions, discharge or axillary adenopathy. Pelvic  Ext/BUS/vagina  normal with mild atrophic changes  Cervix  normal   Uterus  anteverted, normal size, shape and contour, midline and mobile nontender   Adnexa  Without masses or tenderness    Anus and perineum  normal   Rectovaginal  normal sphincter tone without palpated masses or tenderness.    Assessment/Plan:  52 y.o. [id number] female for annual exam.   1. Menopausal symptoms. Patient has been without menses for approximately one year. Having worsening hot flushes and night sweats. Actively being followed for bipolar disorder with recent setback with major depressive episode. Had trial of HRT in the past but appeared to have worsening emotional symptoms with progesterone.  I reviewed the whole issue of HRT with her to include the WHI study with increased risk of stroke, heart attack, DVT and breast cancer. The ACOG and NAMS statements for lowest dose for the shortest period of time reviewed. Transdermal versus oral first-pass effect benefit discussed. After lengthy discussion I recommended she try Minivelle 0.05 mg patches 2 weeks samples given  and she'll call me at the end of 2 weeks to see how she's doing. We will discuss progesterone addition and may consider either daily Prometrium 100 mg or intermittent monthly or every other monthly progesterone. Possible combination patch with estrogen progesterone also discussed. Patient will call me in 2 weeks and then we'll go from there. 2. Atrophic vaginitis. Had been using Premarin cream intermittently for vaginal dryness. With the addition of Minivelle will hold on vaginal cream and we'll see how she does. 3. DEXA 2012 normal. Plan repeat at 5 year interval. Increase calcium vitamin D reviewed. 4. Pap smear 2012. A Pap smear done today. No history of abnormal Pap smears previously. Plan repeat next year 3 year interval. 5. Genital herpes. Patient takes acyclovir 200 mg capsule every morning to prevent recurrences and is doing well with this and wants to continue and I refilled her times year. 6. Colonoscopy 2010. Repeated their recommended interval. 7. Health maintenance. No lab work done as this is done through her primary physician's office. Followup in 2 weeks with her response to estrogen patch.   Note: This document was prepared with digital dictation and possible smart phrase technology. Any transcriptional errors that result from this process are unintentional.   Dara Lords MD, 2:34 PM 08/10/2012

## 2012-08-10 NOTE — Addendum Note (Signed)
Addended by: Dara Lords on: 08/10/2012 04:22 PM   Modules accepted: Orders

## 2012-08-11 LAB — URINALYSIS W MICROSCOPIC + REFLEX CULTURE
Bacteria, UA: NONE SEEN
Bilirubin Urine: NEGATIVE
Crystals: NONE SEEN
Nitrite: NEGATIVE
Protein, ur: NEGATIVE mg/dL
Specific Gravity, Urine: 1.008 (ref 1.005–1.030)
Squamous Epithelial / LPF: NONE SEEN
Urobilinogen, UA: 0.2 mg/dL (ref 0.0–1.0)

## 2012-08-24 ENCOUNTER — Encounter: Payer: Self-pay | Admitting: Gynecology

## 2012-08-24 ENCOUNTER — Telehealth: Payer: Self-pay | Admitting: *Deleted

## 2012-08-24 MED ORDER — PROGESTERONE MICRONIZED 100 MG PO CAPS: ORAL_CAPSULE | ORAL | Status: DC

## 2012-08-24 MED ORDER — ESTRADIOL 0.05 MG/24HR TD PTTW: 1.0000 | MEDICATED_PATCH | TRANSDERMAL | Status: DC

## 2012-08-24 NOTE — Telephone Encounter (Signed)
Okay to refill through next annual exam both the Minivelle and add Prometrium 100 mg nightly as we discussed at office visit.

## 2012-08-24 NOTE — Telephone Encounter (Signed)
rx sent, left on voicemail this has been done 

## 2012-08-24 NOTE — Telephone Encounter (Signed)
Pt is calling to follow up from OV 08/10/12 regarding minivelle dot patch, pt said she can't really tell if patch is doing well because her psychiatrist has changed her medication, but she is would like a Rx for patch. Please advise

## 2012-08-26 ENCOUNTER — Telehealth: Payer: Self-pay | Admitting: *Deleted

## 2012-08-26 NOTE — Telephone Encounter (Signed)
Pt calling to follow up with telephone encounter on 08/24/12. Pt said thought of maybe she could wait on taking the progesterone 100 mg pill? Pt said her psychiatrist is changing her medication and has just be placed on a new drug. So far the estradiol patch has decreased the frequency of hot flashes. Pt would like your opinion if you think this would be okay to wait on taking the progesterone? Please advise

## 2012-08-26 NOTE — Telephone Encounter (Signed)
If she prefers to stay off of the progesterone for now that's okay she will then need some form of withdrawal. We could go with Prometrium 200 mg for 12 days first of each month and this will then bring on a withdrawal bleed but she will not need to be on the progesterone every day. Alternatively be on a low dose progesterone 100 mg every day.

## 2012-08-27 MED ORDER — PROGESTERONE MICRONIZED 200 MG PO CAPS: ORAL_CAPSULE | ORAL | Status: DC

## 2012-08-27 NOTE — Telephone Encounter (Signed)
Pt informed with the below note, rx for 1-12 day of month sent to pharmacy.

## 2012-08-27 NOTE — Telephone Encounter (Signed)
She needs to withdraw on progesterone every month. So she can stay off of progesterone for one month but then she needs to take the 200 mg daily for 12 days and this will bring on a period to shed any lining that has built up. She needs to do this every month. This will prevent her uterus from developing pre-cancer or uterine cancer in response to the estrogen if it was given alone. If she continues to have questions about this then office visit to discuss.

## 2012-08-27 NOTE — Telephone Encounter (Signed)
Pt said she would prefer to stay off progesterone for "now". How long does that mean? A couple of months? Weeks?

## 2012-09-08 ENCOUNTER — Telehealth: Payer: Self-pay

## 2012-09-08 NOTE — Telephone Encounter (Signed)
Pharmacist called to verify strength of Progesterone as patient had questioned it. I reviewed chart and spoke with patient. Pharmacy notified Progesterone 200 mg d 1-12 q mos is correct Rx.

## 2012-09-15 ENCOUNTER — Telehealth: Payer: Self-pay | Admitting: *Deleted

## 2012-09-15 NOTE — Telephone Encounter (Signed)
Late entry) pt called c/o issues with HRT and additional questions. Per TF note on telephone conversation on 08/26/12 OV best if more questions. I left this on pt voicemail to make OV with TF.

## 2012-09-17 ENCOUNTER — Ambulatory Visit (INDEPENDENT_AMBULATORY_CARE_PROVIDER_SITE_OTHER): Payer: 59 | Admitting: Gynecology

## 2012-09-17 ENCOUNTER — Encounter: Payer: Self-pay | Admitting: Gynecology

## 2012-09-17 DIAGNOSIS — N951 Menopausal and female climacteric states: Secondary | ICD-10-CM

## 2012-09-17 MED ORDER — ESTRADIOL 0.1 MG/24HR TD PTTW: 1.0000 | MEDICATED_PATCH | TRANSDERMAL | Status: DC

## 2012-09-17 MED ORDER — PROGESTERONE MICRONIZED 100 MG PO CAPS: 100.0000 mg | ORAL_CAPSULE | Freq: Every day | ORAL | Status: DC

## 2012-09-17 NOTE — Patient Instructions (Signed)
Start on the new higher dose of estrogen patch. Start the Prometrium 100 mg after you're done with the 200 mg dose. Followup with me if you any questions or issues with the hormone replacement.

## 2012-09-17 NOTE — Progress Notes (Signed)
Patient presents to discuss her hormone replacement. She is on Minivelle 0.05 mg patches and they said her hot flashes seem to be better. They are not totally gone better. She is on Prometrium 200 mg daily for the first 12 days of each month. She noted when she started the first couple days of Prometrium she psychologically felt worse over the last several days actually has felt much better. She also is having some of her psychiatric medications adjusted during this time. She is wondering whether we need to make changes to her hormone replacement therapy. She also asked about doing hormone level testing. I reviewed that I did not feel serum levels add much to therapeutic decisions. As she is getting a good response to the patch although not totally symptom free I suggest that we increase her dose of Minivelle. Originally I suggested 0.075 but the patient would prefer to try the 0.1 mg patch. I also suggested since she is feeling better on the 200 mcg dose of Prometrium that we switch her to Prometrium 100 mg daily at the end of her 200 mg course. Alternatives would be a trial of Duavee to avoid the progesterone issue altogether. I again reviewed the issues of HRT to include increased risk of stroke heart attack DVT and possibly increased risk of breast cancer. The [facility] issues of uterine protection with still the thrombosis risk and unknown breast risk discussed. Patient is going to try the above regiment and then we'll go from there. She did ask about a thyroid panel she has a strong family history of thyroid dysfunction and has not had her thyroid checked in a while and I ordered a thyroid profile.

## 2012-09-18 LAB — THYROID PANEL
Free T4: 0.84 ng/dL (ref 0.80–1.80)
T3 Uptake: 43.1 % — ABNORMAL HIGH (ref 22.5–37.0)
TSH: 1.371 u[IU]/mL (ref 0.350–4.500)

## 2012-09-20 ENCOUNTER — Other Ambulatory Visit: Payer: Self-pay | Admitting: Gynecology

## 2012-09-20 DIAGNOSIS — R946 Abnormal results of thyroid function studies: Secondary | ICD-10-CM

## 2012-09-21 ENCOUNTER — Telehealth: Payer: Self-pay

## 2012-09-21 NOTE — Telephone Encounter (Signed)
Dr. Ashley Jacobs was informed of below.  She asked me to ask you might you try her on a bit of thyroid medication in light of this. She said she has had a horrible year of panic attacks, anxiety and depression and she is desperate to feel better. She said if this could be a tiny piece of the puzzle she would love to try medication to see if it might help her feel better.  I did explain that if you did not feel it was clinically relevant that I doubted you would feel the need to put her on medication.  She tearfully asked me to please check with you as she is desperate for help.     Notes Recorded by Dara Lords, MD on 09/18/2012 at 6:38 PM Tell patient one value in thyroid panel off slightly, remainder normal. I don't think clinically relevant, but I would repeat a thyroid profile in 3 months.      Range 4d ago  60mo ago      5.0 - 12.5 ug/dL 7.0        21.3 - 08.6 % 43.1 (H)       0.350 - 4.500 uIU/mL 1.371   3.796              Resulting Agency

## 2012-09-21 NOTE — Telephone Encounter (Signed)
I would not start her on medication based upon this one lab test, but given her symptoms she needs to see her primary to consider antianxiety or antidepressant medication, or a mental health provider ie julie whitt at [facility]

## 2012-09-21 NOTE — Telephone Encounter (Signed)
Patient informed. Patient sees psychiatrist for meds and actually saw new psychiatrist on Friday and had meds adjusted.  She is hoping that between that and new estrogen patch dose that she will feel better soon. She was reminded to return in 3 mos for thyroid panel recheck.

## 2012-09-28 ENCOUNTER — Other Ambulatory Visit: Payer: Self-pay | Admitting: Gynecology

## 2012-09-28 ENCOUNTER — Telehealth: Payer: Self-pay

## 2012-09-28 MED ORDER — PROGESTERONE MICRONIZED 100 MG PO CAPS: 100.0000 mg | ORAL_CAPSULE | Freq: Two times a day (BID) | ORAL | Status: DC

## 2012-09-28 NOTE — Telephone Encounter (Signed)
Patient wants to know if okay to take 100mg  am and 100mg  pm as it seems to make her feel so much better when she takes it.

## 2012-09-28 NOTE — Telephone Encounter (Signed)
Okay to split the dose 100 mg twice a day

## 2012-09-28 NOTE — Telephone Encounter (Signed)
Patient advised. New rx called in with new directions.

## 2012-09-28 NOTE — Telephone Encounter (Signed)
Instead of going to the 100 mg daily I would stay at 200 mg Prometrium daily along with the patch and we'll see how she does. If need be we can try increasing her to 300 mg I would try the 200 mg for a little bit to see how she does with this.

## 2012-09-28 NOTE — Telephone Encounter (Signed)
Patient said 10 days ago you increased her estrogen patch to 0.1mg . She said she takes 200mg  progesterone Day 1-12 then 100 mg daily.  Patient saw her new psychiatrist on Friday and she increased her ?Lomentil?Marland Kitchen She said since that increase her anxiety has been the worst she has ever experienced.  She said the only thing that made her feel better was taking the progesterone.  She said when she takes the progesterone in the evening she can eat.  She said the progesterone is the only thing that decreases her anxiety and decreases her sense of well being and helps her to eat.  With that said she wondered if she might need more progesterone?  She said she was talking with her sister and she told her some women need more progesterone and her sister told her she had been on 400mg  daily for a time during her menopause.

## 2012-09-30 ENCOUNTER — Telehealth: Payer: Self-pay | Admitting: *Deleted

## 2012-09-30 NOTE — Telephone Encounter (Signed)
Pt called to ask you if you thought the HRT combination would cause decrease in appetite? Pt said she is having a hard time eating.  Please advise

## 2012-09-30 NOTE — Telephone Encounter (Signed)
No

## 2012-09-30 NOTE — Telephone Encounter (Signed)
Pt informed with the below note. 

## 2012-11-04 ENCOUNTER — Encounter: Payer: Self-pay | Admitting: Gynecology

## 2012-11-10 ENCOUNTER — Telehealth: Payer: Self-pay | Admitting: *Deleted

## 2012-11-10 DIAGNOSIS — N926 Irregular menstruation, unspecified: Secondary | ICD-10-CM

## 2012-11-10 NOTE — Telephone Encounter (Signed)
She historically has not been bleeding for over a year. She will need a sonohysterogram to evaluate the uterus and endometrial lining. We can then discuss game plan at that point.

## 2012-11-10 NOTE — Telephone Encounter (Signed)
Pt is currently taking minivelle patch 0.1 mg and progesterone 100 mg at bedtime pt regarding the frequency of periods. Pt has cycle on 09/13/12, 09/27/12, then 10/18/12, 11/09/12. Cycles are not heavy medium flow lasting 7-8 days, pt asked if this is normal?

## 2012-11-10 NOTE — Telephone Encounter (Signed)
Pt is going to discuss the below with husband and call back to make appointment. Order has been placed.

## 2012-11-18 ENCOUNTER — Other Ambulatory Visit: Payer: Self-pay

## 2012-11-25 ENCOUNTER — Other Ambulatory Visit: Payer: Self-pay | Admitting: Gynecology

## 2012-11-25 DIAGNOSIS — N926 Irregular menstruation, unspecified: Secondary | ICD-10-CM

## 2012-11-25 DIAGNOSIS — N83339 Acquired atrophy of ovary and fallopian tube, unspecified side: Secondary | ICD-10-CM

## 2012-12-15 ENCOUNTER — Encounter: Payer: Self-pay | Admitting: Gynecology

## 2012-12-15 ENCOUNTER — Other Ambulatory Visit: Payer: Self-pay | Admitting: Gynecology

## 2012-12-15 ENCOUNTER — Ambulatory Visit (INDEPENDENT_AMBULATORY_CARE_PROVIDER_SITE_OTHER): Payer: 59

## 2012-12-15 ENCOUNTER — Ambulatory Visit (INDEPENDENT_AMBULATORY_CARE_PROVIDER_SITE_OTHER): Payer: 59 | Admitting: Gynecology

## 2012-12-15 DIAGNOSIS — N950 Postmenopausal bleeding: Secondary | ICD-10-CM

## 2012-12-15 DIAGNOSIS — N926 Irregular menstruation, unspecified: Secondary | ICD-10-CM

## 2012-12-15 DIAGNOSIS — N83339 Acquired atrophy of ovary and fallopian tube, unspecified side: Secondary | ICD-10-CM

## 2012-12-15 NOTE — Patient Instructions (Signed)
Office will call you with biopsy results. Continue hormone replacement as prescribed at present.

## 2012-12-15 NOTE — Progress Notes (Signed)
Patient presents for sonohysterogram. History of initiating HRT in September with patch and initial Prometrium 100 mg nightly. Subsequently increased to 200 mg nightly and she said felt better emotionally on this. Has been having some irregular bleeding with menses type flow almost monthly since initiation. Otherwise doing great with no significant menopausal symptoms and emotionally feeling stable.  Ultrasound shows uterus is overall normal in size. 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 Endometrial echo 7.5 mm. Right and left ovaries atrophic. Cul-de-sac negative.  Sonohysterogram performed, sterile technique, single to tenaculum anterior lip straightening required, easy catheter introduction, the distention with no abnormalities. Endometrial sample taken. Patient tolerated well.  Assessment and plan: Recently started HRT with irregular bleeding after one year of no bleeding. Patient will follow up for biopsy results. At present we'll follow bleeding pattern and as long as it resolves we will monitor. I am going to check her [facility] now. If irregular bleeding continues manipulation of her medications reviewed to include intermittent progesterone withdrawal monthly for more predictable bleeding or consider low-dose oral contraceptives possible IUD also.

## 2012-12-17 ENCOUNTER — Telehealth: Payer: Self-pay | Admitting: *Deleted

## 2012-12-17 NOTE — Telephone Encounter (Signed)
Pt informed with the below note. 

## 2012-12-17 NOTE — Telephone Encounter (Signed)
Pt was seen for [facility] on 12/15/12 and forgot to ask you the recommended amount of calcium should she take? Please advise

## 2012-12-17 NOTE — Telephone Encounter (Signed)
1200 mg total dietary

## 2013-04-05 ENCOUNTER — Telehealth: Payer: Self-pay | Admitting: *Deleted

## 2013-04-05 NOTE — Telephone Encounter (Signed)
Pt called to see if time to have hormone levels checked. I left on voicemail that I do not see any order to have recheck.

## 2013-05-06 ENCOUNTER — Telehealth: Payer: Self-pay | Admitting: *Deleted

## 2013-05-06 NOTE — Telephone Encounter (Signed)
(  pt aware you are out of the office) Pt is currently taking minivelle 0.1 mg patch and progesterone nightly. States every month she could have spotting from 2-9 days very light brownish/red blood. Also having breast tenderness with cramping, she asked if this normal with the dose of HRT she is taking? Not having any hot flashes, wondering if she would need to decrease in HRT dose? Or if labs should be checked? Please advise

## 2013-05-09 NOTE — Telephone Encounter (Signed)
She should not continue to bleed like that. I would recommend continuing the Minivelle estrogen patch changing the progesterone to Prometrium 200 mg for the first 12 days of each month. She probably will have a light flow at the end of that is 12 days but hopefully it'll be very predictable and we'll do this for now and will followup with her this summer which she is due for her annual exam. Prescribed Prometrium 200 mg #12 one by mouth daily first 12 days of each month refill x4

## 2013-05-09 NOTE — Telephone Encounter (Signed)
Pt will call back once speaking with her psychiatry MD regarding new rx. Rx will not be sent until patient calls to confirm

## 2013-05-09 NOTE — Telephone Encounter (Signed)
Left message for pt to call.

## 2013-05-17 NOTE — Telephone Encounter (Signed)
Pt spoke with her psychiatry Md regarding the below note and she and he both decided to continue with taking the progesterone nightly for now. Pt asked me to relay this to you.

## 2013-06-02 ENCOUNTER — Ambulatory Visit (INDEPENDENT_AMBULATORY_CARE_PROVIDER_SITE_OTHER): Payer: 59 | Admitting: Women's Health

## 2013-06-02 ENCOUNTER — Encounter: Payer: Self-pay | Admitting: Women's Health

## 2013-06-02 DIAGNOSIS — Z79890 Hormone replacement therapy (postmenopausal): Secondary | ICD-10-CM

## 2013-06-02 MED ORDER — ESTRADIOL 0.05 MG/24HR TD PTTW: 1.0000 | MEDICATED_PATCH | TRANSDERMAL | Status: DC

## 2013-06-02 NOTE — Progress Notes (Signed)
Patient ID: Ann Mendez, female   DOB: [birth year], 53 y.o.   MRN: [id number] Presents to discuss HRT. Struggling  with anxiety and depression, seeing a psychiatrist and counselor. 2014 had increased panic/anxiety had to quit her job. Had been on Prometrium 200 day 1- 12 at each cycle is now currently on 100 daily and minivelle .1 patch. Has had some irregular spotting had a negative endometrial biopsy 12/2012. Questions if she should stop HRT or change dosage. Minimal hot flushes, occasional breast tenderness. Psychiatrist has changed her medications for depression and anxiety, is still not feeling well, states struggles with anxiety daily. Denies feelings of harming self. Children are teenagers, they are doing well. Daughter had problem with grand mal seizures is doing much better seizure-free for 2 years. Reports good family support.  Exam: Tearful, anxious.  HRT management  Plan: Continue counseling and medications. Options reviewed,  will try Vivelle 0.05 patch twice weekly and continue Prometrium 100 daily.  Reviewed importance of not stopping HRT suddenly, can wean down gradually. Reviewed HRT usually helps with mood.

## 2013-06-09 ENCOUNTER — Ambulatory Visit: Payer: 59 | Admitting: Women's Health

## 2013-06-17 ENCOUNTER — Telehealth: Payer: Self-pay | Admitting: *Deleted

## 2013-06-17 NOTE — Telephone Encounter (Signed)
Pt had OV on 06/02/13 switched estradiol patch to 0.05 mg and progesterone 100 mg nightly. Pt noticed that she is having alitte heavier flow asked if this could be due to decrease estradiol patch to 0.05 mg? Should she take 1/2 of progesterone maybe every other day? Or if all normal? Please advise

## 2013-06-17 NOTE — Telephone Encounter (Signed)
Message left

## 2013-06-20 NOTE — Telephone Encounter (Signed)
Message left

## 2013-06-21 NOTE — Telephone Encounter (Signed)
Telephone call, reviewed importance of continuing progestin with estrogen, will continue on the estradiol patch 0.05 .

## 2013-08-11 ENCOUNTER — Encounter: Payer: Self-pay | Admitting: Gynecology

## 2013-08-11 ENCOUNTER — Other Ambulatory Visit: Payer: Self-pay | Admitting: Gynecology

## 2013-08-11 ENCOUNTER — Other Ambulatory Visit (HOSPITAL_COMMUNITY)
Admission: RE | Admit: 2013-08-11 | Discharge: 2013-08-11 | Disposition: A | Discharge status: Home / Self Care | Payer: 59 | Source: Ambulatory Visit | Attending: Gynecology | Admitting: Gynecology

## 2013-08-11 ENCOUNTER — Ambulatory Visit (INDEPENDENT_AMBULATORY_CARE_PROVIDER_SITE_OTHER): Payer: 59 | Admitting: Gynecology

## 2013-08-11 ENCOUNTER — Telehealth: Payer: Self-pay

## 2013-08-11 VITALS — BP 110/66 | Ht 62.5 in | Wt 110.0 lb

## 2013-08-11 DIAGNOSIS — A609 Anogenital herpesviral infection, unspecified: Secondary | ICD-10-CM

## 2013-08-11 DIAGNOSIS — Z1151 Encounter for screening for human papillomavirus (HPV): Secondary | ICD-10-CM | POA: Diagnosis present

## 2013-08-11 DIAGNOSIS — Z01419 Encounter for gynecological examination (general) (routine) without abnormal findings: Secondary | ICD-10-CM | POA: Diagnosis not present

## 2013-08-11 DIAGNOSIS — Z79890 Hormone replacement therapy (postmenopausal): Secondary | ICD-10-CM

## 2013-08-11 DIAGNOSIS — B009 Herpesviral infection, unspecified: Secondary | ICD-10-CM

## 2013-08-11 MED ORDER — ACYCLOVIR 200 MG PO CAPS: ORAL_CAPSULE | ORAL | Status: DC

## 2013-08-11 MED ORDER — PROGESTERONE MICRONIZED 100 MG PO CAPS: 100.0000 mg | ORAL_CAPSULE | Freq: Every day | ORAL | Status: DC

## 2013-08-11 MED ORDER — ESTRADIOL 0.05 MG/24HR TD PTTW: 1.0000 | MEDICATED_PATCH | TRANSDERMAL | Status: DC

## 2013-08-11 MED ORDER — PROGESTERONE MICRONIZED 100 MG PO CAPS: 200.0000 mg | ORAL_CAPSULE | Freq: Every day | ORAL | Status: DC

## 2013-08-11 NOTE — Telephone Encounter (Signed)
Pharmacy called regarding Rx written for 15 day supply and wanting to increase number of capsules in order to give her a 30 day supply.  I need to clarify.  Your office note said Ann Mendez has her on Prometrium 100 mg every night. The Rx you wrote was for Prometrium 100mg  #30 but the direction says to take TWO capsules every night.   Pls advise.

## 2013-08-11 NOTE — Patient Instructions (Signed)
Followup for fasting blood work as ordered. Followup in one year for annual exam, sooner as needed. Call if any further vaginal bleeding.  You may obtain a copy of any labs that were done today by logging onto MyChart as outlined in the instructions provided with your AVS (after visit summary). The office will not call with normal lab results but certainly if there are any significant abnormalities then we will contact you.   Health Maintenance, Female A healthy lifestyle and preventative care can promote health and wellness.  Maintain regular health, dental, and eye exams.  Eat a healthy diet. Foods like vegetables, fruits, whole grains, low-fat dairy products, and lean protein foods contain the nutrients you need without too many calories. Decrease your intake of foods high in solid fats, added sugars, and salt. Get information about a proper diet from your caregiver, if necessary.  Regular physical exercise is one of the most important things you can do for your health. Most adults should get at least 150 minutes of moderate-intensity exercise (any activity that increases your heart rate and causes you to sweat) each week. In addition, most adults need muscle-strengthening exercises on 2 or more days a week.   Maintain a healthy weight. The body mass index (BMI) is a screening tool to identify possible weight problems. It provides an estimate of body fat based on height and weight. Your caregiver can help determine your BMI, and can help you achieve or maintain a healthy weight. For adults 20 years and older:  A BMI below 18.5 is considered underweight.  A BMI of 18.5 to 24.9 is normal.  A BMI of 25 to 29.9 is considered overweight.  A BMI of 30 and above is considered obese.  Maintain normal blood lipids and cholesterol by exercising and minimizing your intake of saturated fat. Eat a balanced diet with plenty of fruits and vegetables. Blood tests for lipids and cholesterol should begin  at age 47 and be repeated every 5 years. If your lipid or cholesterol levels are high, you are over 50, or you are a high risk for heart disease, you may need your cholesterol levels checked more frequently.Ongoing high lipid and cholesterol levels should be treated with medicines if diet and exercise are not effective.  If you smoke, find out from your caregiver how to quit. If you do not use tobacco, do not start.  Lung cancer screening is recommended for adults aged 42 80 years who are at high risk for developing lung cancer because of a history of smoking. Yearly low-dose computed tomography (CT) is recommended for people who have at least a 30-pack-year history of smoking and are a current smoker or have quit within the past 15 years. A pack year of smoking is smoking an average of 1 pack of cigarettes a day for 1 year (for example: 1 pack a day for 30 years or 2 packs a day for 15 years). Yearly screening should continue until the smoker has stopped smoking for at least 15 years. Yearly screening should also be stopped for people who develop a health problem that would prevent them from having lung cancer treatment.  If you are pregnant, do not drink alcohol. If you are breastfeeding, be very cautious about drinking alcohol. If you are not pregnant and choose to drink alcohol, do not exceed 1 drink per day. One drink is considered to be 12 ounces (355 mL) of beer, 5 ounces (148 mL) of wine, or 1.5 ounces (44 mL) of  liquor.  Avoid use of street drugs. Do not share needles with anyone. Ask for help if you need support or instructions about stopping the use of drugs.  High blood pressure causes heart disease and increases the risk of stroke. Blood pressure should be checked at least every 1 to 2 years. Ongoing high blood pressure should be treated with medicines, if weight loss and exercise are not effective.  If you are 55 to 55 years old, ask your caregiver if you should take aspirin to prevent  strokes.  Diabetes screening involves taking a blood sample to check your fasting blood sugar level. This should be done once every 3 years, after age 45, if you are within normal weight and without risk factors for diabetes. Testing should be considered at a younger age or be carried out more frequently if you are overweight and have at least 1 risk factor for diabetes.  Breast cancer screening is essential preventative care for women. You should practice "breast self-awareness." This means understanding the normal appearance and feel of your breasts and may include breast self-examination. Any changes detected, no matter how small, should be reported to a caregiver. Women in their 20s and 30s should have a clinical breast exam (CBE) by a caregiver as part of a regular health exam every 1 to 3 years. After age 40, women should have a CBE every year. Starting at age 40, women should consider having a mammogram (breast X-ray) every year. Women who have a family history of breast cancer should talk to their caregiver about genetic screening. Women at a high risk of breast cancer should talk to their caregiver about having an MRI and a mammogram every year.  Breast cancer gene (BRCA)-related cancer risk assessment is recommended for women who have family members with BRCA-related cancers. BRCA-related cancers include breast, ovarian, tubal, and peritoneal cancers. Having family members with these cancers may be associated with an increased risk for harmful changes (mutations) in the breast cancer genes BRCA1 and BRCA2. Results of the assessment will determine the need for genetic counseling and BRCA1 and BRCA2 testing.  The Pap test is a screening test for cervical cancer. Women should have a Pap test starting at age 21. Between ages 21 and 29, Pap tests should be repeated every 2 years. Beginning at age 30, you should have a Pap test every 3 years as long as the past 3 Pap tests have been normal. If you had a  hysterectomy for a problem that was not cancer or a condition that could lead to cancer, then you no longer need Pap tests. If you are between ages 65 and 70, and you have had normal Pap tests going back 10 years, you no longer need Pap tests. If you have had past treatment for cervical cancer or a condition that could lead to cancer, you need Pap tests and screening for cancer for at least 20 years after your treatment. If Pap tests have been discontinued, risk factors (such as a new sexual partner) need to be reassessed to determine if screening should be resumed. Some women have medical problems that increase the chance of getting cervical cancer. In these cases, your caregiver may recommend more frequent screening and Pap tests.  The human papillomavirus (HPV) test is an additional test that may be used for cervical cancer screening. The HPV test looks for the virus that can cause the cell changes on the cervix. The cells collected during the Pap test can be tested for   HPV. The HPV test could be used to screen women aged 21 years and older, and should be used in women of any age who have unclear Pap test results. After the age of 30, women should have HPV testing at the same frequency as a Pap test.  Colorectal cancer can be detected and often prevented. Most routine colorectal cancer screening begins at the age of 85 and continues through age 86. However, your caregiver may recommend screening at an earlier age if you have risk factors for colon cancer. On a yearly basis, your caregiver may provide home test kits to check for hidden blood in the stool. Use of a small camera at the end of a tube, to directly examine the colon (sigmoidoscopy or colonoscopy), can detect the earliest forms of colorectal cancer. Talk to your caregiver about this at age 72, when routine screening begins. Direct examination of the colon should be repeated every 5 to 10 years through age 74, unless early forms of pre-cancerous  polyps or small growths are found.  Hepatitis C blood testing is recommended for all people born from [birth year] through 1967 and any individual with known risks for hepatitis C.  Practice safe sex. Use condoms and avoid high-risk sexual practices to reduce the spread of sexually transmitted infections (STIs). Sexually active women aged 46 and younger should be checked for Chlamydia, which is a common sexually transmitted infection. Older women with new or multiple partners should also be tested for Chlamydia. Testing for other STIs is recommended if you are sexually active and at increased risk.  Osteoporosis is a disease in which the bones lose minerals and strength with aging. This can result in serious bone fractures. The risk of osteoporosis can be identified using a bone density scan. Women ages 63 and over and women at risk for fractures or osteoporosis should discuss screening with their caregivers. Ask your caregiver whether you should be taking a calcium supplement or vitamin D to reduce the rate of osteoporosis.  Menopause can be associated with physical symptoms and risks. Hormone replacement therapy is available to decrease symptoms and risks. You should talk to your caregiver about whether hormone replacement therapy is right for you.  Use sunscreen. Apply sunscreen liberally and repeatedly throughout the day. You should seek shade when your shadow is shorter than you. Protect yourself by wearing long sleeves, pants, a wide-brimmed hat, and sunglasses year round, whenever you are outdoors.  Notify your caregiver of new moles or changes in moles, especially if there is a change in shape or color. Also notify your caregiver if a mole is larger than the size of a pencil eraser.  Stay current with your immunizations. Document Released: 07/15/2010 Document Revised: 04/26/2012 Document Reviewed: 07/15/2010 [facility] Patient Information 2014 [employer].

## 2013-08-11 NOTE — Addendum Note (Signed)
Addended by: Nelva Nay on: 08/11/2013 10:02 AM   Modules accepted: Orders

## 2013-08-11 NOTE — Telephone Encounter (Signed)
Prescription should be Prometrium 100 mg #30 one by mouth each bedtime refill x11 and Vivelle-Dot 0.05 patch twice weekly #8 refill 11

## 2013-08-11 NOTE — Progress Notes (Signed)
[place] 22-Oct-1960 [id number]        53 y.o.  [id number] for annual exam.  Several issues noted below.  Past medical history,surgical history, problem list, medications, allergies, family history and social history were all reviewed and documented as reviewed in the EPIC chart.  ROS:  12 system ROS performed with pertinent positives and negatives included in the history, assessment and plan.   Additional significant findings :  None   Exam: Kim assistant Filed Vitals:   08/11/13 0852  BP: 110/66  Height: 5' 2.5" (1.588 m)  Weight: 110 lb (49.896 kg)   General appearance:  Normal affect, orientation and appearance. Skin: Grossly normal HEENT: Without gross lesions.  No cervical or supraclavicular adenopathy. Thyroid normal.  Lungs:  Clear without wheezing, rales or rhonchi Cardiac: RR, without RMG Abdominal:  Soft, nontender, without masses, guarding, rebound, organomegaly or hernia Breasts:  Examined lying and sitting without masses, retractions, discharge or axillary adenopathy. Pelvic:  Ext/BUS/vagina with generalized atrophic changes  Cervix with atrophic changes. Pap/HPV  Uterus anteverted, normal size, shape and contour, midline and mobile nontender   Adnexa  Without masses or tenderness    Anus and perineum  Normal   Rectovaginal  Normal sphincter tone without palpated masses or tenderness.    Assessment/Plan:  53 y.o. [id number] female for annual exam.   1. Postmenopausal size HRT. Patient continues on Vivelle 0.05 mg patch and Prometrium 100 mg nightly. Recently saw Izora Gala 06/02/2013 with adjustment of her dosage to include decreasing to the point of 5 from 0.1 and changing to the 100 mg nightly instead of the 200 mg intermittent. Patient did note several days of spotting when she decreased her dose. Recently in December 2014 at evaluation for bleeding to include negative sonohysterogram and endometrial biopsy showing atrophic endometrium.  I again reviewed the whole issue  of HRT with her to include the WHI study with increased risk of stroke, heart attack, DVT and breast cancer. The ACOG and NAMS statements for lowest dose for the shortest period of time reviewed. Transdermal versus oral first-pass effect benefit discussed. The patient feels well and wants to continue and I refilled her x1 year. I think the bleeding was due to decreasing her estradiol dose and with an endometrial workup within 6 months of this episode will hold on any further evaluation at this point. Patient does not recall she does any further bleeding. 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 Acyclovir 200 mg daily #30 refill x1 year provided. 3. Pap smear 2012. Pap/HPV today. No history of abnormal Pap smears previously. Plan repeat at 3-5 year interval assuming this Pap smear is normal. 4. DEXA 2012 normal. Will plan to repeat at age 54. Increased calcium and vitamin D reviewed. 5. Mammogram 10/2012. Continued annual mammography this fall. SBE monthly reviewed. 6. Colonoscopy 2010. Repeat that they are recommended interval. 7. Health maintenance. Patient ate breakfast morning. She does request fasting labs and CBC comprehensive metabolic panel lipid profile urinalysis ordered for a future order. She'll return fasting. Reports a recent TSH normal. Followup in one year assuming she continues well, sooner as needed.   Note: This document was prepared with digital dictation and possible smart phrase technology. Any transcriptional errors that result from this process are unintentional.   Anastasio Auerbach MD, 9:40 AM 08/11/2013

## 2013-08-11 NOTE — Telephone Encounter (Signed)
Rx corrected. Pharmacy notified. New Rx sent.

## 2013-08-12 LAB — URINALYSIS W MICROSCOPIC + REFLEX CULTURE
Bacteria, UA: NONE SEEN
Bilirubin Urine: NEGATIVE
Casts: NONE SEEN
Crystals: NONE SEEN
Glucose, UA: NEGATIVE mg/dL
HGB URINE DIPSTICK: NEGATIVE
Ketones, ur: NEGATIVE mg/dL
LEUKOCYTES UA: NEGATIVE
Nitrite: NEGATIVE
PROTEIN: NEGATIVE mg/dL
SQUAMOUS EPITHELIAL / LPF: NONE SEEN
Specific Gravity, Urine: 1.013 (ref 1.005–1.030)
UROBILINOGEN UA: 0.2 mg/dL (ref 0.0–1.0)
pH: 6.5 (ref 5.0–8.0)

## 2013-08-16 LAB — CYTOLOGY - PAP

## 2013-11-14 ENCOUNTER — Encounter: Payer: Self-pay | Admitting: Gynecology

## 2013-11-18 ENCOUNTER — Encounter: Payer: Self-pay | Admitting: Gynecology

## 2013-12-28 ENCOUNTER — Telehealth: Payer: Self-pay | Admitting: *Deleted

## 2013-12-28 NOTE — Telephone Encounter (Signed)
Pt takes vivelle dot patches 0.05mg  and Prometrium 100 mg at bedtime, would like to wean off HRT, asked how to go about doing so? Please advise

## 2013-12-28 NOTE — Telephone Encounter (Signed)
Cut the patch in half and do this for 1 month and then stop it and see how she does. Continue on the Prometrium at bedtime as long as she is using the patches.

## 2013-12-28 NOTE — Telephone Encounter (Signed)
Left the below information on pt voicemail as directed by patient

## 2014-04-19 ENCOUNTER — Telehealth: Payer: Self-pay | Admitting: Gynecology

## 2014-04-19 NOTE — Telephone Encounter (Signed)
Received phone call from Dr. Edgar Frisk, patient's psychiatrist.  They are having trouble managing her depression on multiple different occasions. The patient feels that since starting the progesterone her symptoms seem to got worse. I recommended to the psychiatrist that they stop the progesterone. She continue on the Vivelle patch and have the patient make an appointment to see me in one month to discuss longer-term options. Intermittent progesterone withdrawal options versus no progesterone with intermittent sampling for endometrial stimulation reviewed. Also possible trial of Duavee. Dr. Edgar Frisk we'll go ahead and tell the patient and then she will follow up with me in a month.

## 2014-05-04 ENCOUNTER — Encounter: Payer: Self-pay | Admitting: Gastroenterology

## 2014-06-20 ENCOUNTER — Encounter: Payer: Self-pay | Admitting: Gynecology

## 2014-06-20 ENCOUNTER — Ambulatory Visit (INDEPENDENT_AMBULATORY_CARE_PROVIDER_SITE_OTHER): Payer: 59 | Admitting: Gynecology

## 2014-06-20 VITALS — BP 110/60

## 2014-06-20 DIAGNOSIS — Z79890 Hormone replacement therapy (postmenopausal): Secondary | ICD-10-CM | POA: Diagnosis not present

## 2014-06-20 NOTE — Progress Notes (Signed)
[place] 12-11-1960 [id number]        54 y.o.  [id number] Presents to discuss her HRT regiment.  Had been on Vivelle 0.05 mg patch and Prometrium 100 mg nightly. Per my phone discussion is 04/19/2014 with her psychiatrist we have decided to discontinue her progesterone to see if this would not help regulating her anti-depressive medication regiment. She also right now is adjusting her Synthroid dose being treated for subclinical hypothyroidism. States that she appears to feel little better off of the progesterone.  Past medical history,surgical history, problem list, medications, allergies, family history and social history were all reviewed and documented in the EPIC chart.  Directed ROS with pertinent positives and negatives documented in the history of present illness/assessment and plan.  Exam: Filed Vitals:   06/20/14 1613  BP: 110/60   General appearance:  Normal   Assessment/Plan:  54 y.o. [id number] patient with above history. I reviewed with her the issues of using progesterone in an HRT regimen to prevent endometrial hyperplasia/cancer. I reviewed with her the options to include going back on the previous regiment, using intermittent progesterone withdrawals for short bursts such as every month to every third month for 12 days for withdrawal, staying off of progesterone and doing endometrial assessment at some interval either with endometrial biopsy alone or ultrasound and endometrial biopsy. She did have a sonohysterogram 2014 which showed a endometrial echo of 7.5 mm, empty cavity with biopsy showing atrophic changes. Lastly I discussed Duavee and the issues of endometrial protection with the non-progesterone like serm. After a lengthy discussion the patient prefers to stay on estrogen alone and we will plan on scheduling a sonohysterogram at the end of this year after 6 months or so of estrogen alone to assess endometrial status. Patient is due for her annual exam in July and will follow  up for this and we will see how she's doing at that point but tentatively we will plan on follow up at the end of this year for endometrial assessment.    Anastasio Auerbach MD, 4:58 PM 06/20/2014

## 2014-06-20 NOTE — Patient Instructions (Signed)
Follow up for annual exam this summer when due.

## 2014-08-25 ENCOUNTER — Encounter: Payer: Self-pay | Admitting: Gynecology

## 2014-08-25 ENCOUNTER — Ambulatory Visit (INDEPENDENT_AMBULATORY_CARE_PROVIDER_SITE_OTHER): Payer: Commercial Managed Care - HMO | Admitting: Gynecology

## 2014-08-25 VITALS — BP 120/74 | Ht 62.5 in | Wt 116.0 lb

## 2014-08-25 DIAGNOSIS — N952 Postmenopausal atrophic vaginitis: Secondary | ICD-10-CM

## 2014-08-25 DIAGNOSIS — Z01419 Encounter for gynecological examination (general) (routine) without abnormal findings: Secondary | ICD-10-CM

## 2014-08-25 DIAGNOSIS — Z79890 Hormone replacement therapy (postmenopausal): Secondary | ICD-10-CM | POA: Diagnosis not present

## 2014-08-25 MED ORDER — ESTRADIOL 0.05 MG/24HR TD PTTW: 1.0000 | MEDICATED_PATCH | TRANSDERMAL | Status: DC

## 2014-08-25 NOTE — Patient Instructions (Signed)
Check with your primary physician next time you see them as far as whether they have screened you for hepatitis C and HIV as recommended by CDC.  Follow up in December for the ultrasound  You may obtain a copy of any labs that were done today by logging onto MyChart as outlined in the instructions provided with your AVS (after visit summary). The office will not call with normal lab results but certainly if there are any significant abnormalities then we will contact you.   Health Maintenance Adopting a healthy lifestyle and getting preventive care can go a long way to promote health and wellness. Talk with your health care provider about what schedule of regular examinations is right for you. This is a good chance for you to check in with your provider about disease prevention and staying healthy. In between checkups, there are plenty of things you can do on your own. Experts have done a lot of research about which lifestyle changes and preventive measures are most likely to keep you healthy. Ask your health care provider for more information. WEIGHT AND DIET  Eat a healthy diet  Be sure to include plenty of vegetables, fruits, low-fat dairy products, and lean protein.  Do not eat a lot of foods high in solid fats, added sugars, or salt.  Get regular exercise. This is one of the most important things you can do for your health.  Most adults should exercise for at least 150 minutes each week. The exercise should increase your heart rate and make you sweat (moderate-intensity exercise).  Most adults should also do strengthening exercises at least twice a week. This is in addition to the moderate-intensity exercise.  Maintain a healthy weight  Body mass index (BMI) is a measurement that can be used to identify possible weight problems. It estimates body fat based on height and weight. Your health care provider can help determine your BMI and help you achieve or maintain a healthy weight.  For  females 20 years of age and older:   A BMI below 18.5 is considered underweight.  A BMI of 18.5 to 24.9 is normal.  A BMI of 25 to 29.9 is considered overweight.  A BMI of 30 and above is considered obese.  Watch levels of cholesterol and blood lipids  You should start having your blood tested for lipids and cholesterol at 56 years of age, then have this test every 5 years.  You may need to have your cholesterol levels checked more often if:  Your lipid or cholesterol levels are high.  You are older than 56 years of age.  You are at high risk for heart disease.  CANCER SCREENING   Lung Cancer  Lung cancer screening is recommended for adults 55-80 years old who are at high risk for lung cancer because of a history of smoking.  A yearly low-dose CT scan of the lungs is recommended for people who:  Currently smoke.  Have quit within the past 15 years.  Have at least a 30-pack-year history of smoking. A pack year is smoking an average of one pack of cigarettes a day for 1 year.  Yearly screening should continue until it has been 15 years since you quit.  Yearly screening should stop if you develop a health problem that would prevent you from having lung cancer treatment.  Breast Cancer  Practice breast self-awareness. This means understanding how your breasts normally appear and feel.  It also means doing regular breast self-exams. Let   your health care provider know about any changes, no matter how small.  If you are in your 20s or 30s, you should have a clinical breast exam (CBE) by a health care provider every 1-3 years as part of a regular health exam.  If you are 40 or older, have a CBE every year. Also consider having a breast X-ray (mammogram) every year.  If you have a family history of breast cancer, talk to your health care provider about genetic screening.  If you are at high risk for breast cancer, talk to your health care provider about having an MRI and  a mammogram every year.  Breast cancer gene (BRCA) assessment is recommended for women who have family members with BRCA-related cancers. BRCA-related cancers include:  Breast.  Ovarian.  Tubal.  Peritoneal cancers.  Results of the assessment will determine the need for genetic counseling and BRCA1 and BRCA2 testing. Cervical Cancer Routine pelvic examinations to screen for cervical cancer are no longer recommended for nonpregnant women who are considered low risk for cancer of the pelvic organs (ovaries, uterus, and vagina) and who do not have symptoms. A pelvic examination may be necessary if you have symptoms including those associated with pelvic infections. Ask your health care provider if a screening pelvic exam is right for you.   The Pap test is the screening test for cervical cancer for women who are considered at risk.  If you had a hysterectomy for a problem that was not cancer or a condition that could lead to cancer, then you no longer need Pap tests.  If you are older than 65 years, and you have had normal Pap tests for the past 10 years, you no longer need to have Pap tests.  If you have had past treatment for cervical cancer or a condition that could lead to cancer, you need Pap tests and screening for cancer for at least 20 years after your treatment.  If you no longer get a Pap test, assess your risk factors if they change (such as having a new sexual partner). This can affect whether you should start being screened again.  Some women have medical problems that increase their chance of getting cervical cancer. If this is the case for you, your health care provider may recommend more frequent screening and Pap tests.  The human papillomavirus (HPV) test is another test that may be used for cervical cancer screening. The HPV test looks for the virus that can cause cell changes in the cervix. The cells collected during the Pap test can be tested for HPV.  The HPV test can  be used to screen women 30 years of age and older. Getting tested for HPV can extend the interval between normal Pap tests from three to five years.  An HPV test also should be used to screen women of any age who have unclear Pap test results.  After 56 years of age, women should have HPV testing as often as Pap tests.  Colorectal Cancer  This type of cancer can be detected and often prevented.  Routine colorectal cancer screening usually begins at 56 years of age and continues through 56 years of age.  Your health care provider may recommend screening at an earlier age if you have risk factors for colon cancer.  Your health care provider may also recommend using home test kits to check for hidden blood in the stool.  A small camera at the end of a tube can be used to   examine your colon directly (sigmoidoscopy or colonoscopy). This is done to check for the earliest forms of colorectal cancer.  Routine screening usually begins at age 75.  Direct examination of the colon should be repeated every 5-10 years through 56 years of age. However, you may need to be screened more often if early forms of precancerous polyps or small growths are found. Skin Cancer  Check your skin from head to toe regularly.  Tell your health care provider about any new moles or changes in moles, especially if there is a change in a mole's shape or color.  Also tell your health care provider if you have a mole that is larger than the size of a pencil eraser.  Always use sunscreen. Apply sunscreen liberally and repeatedly throughout the day.  Protect yourself by wearing long sleeves, pants, a wide-brimmed hat, and sunglasses whenever you are outside. HEART DISEASE, DIABETES, AND HIGH BLOOD PRESSURE   Have your blood pressure checked at least every 1-2 years. High blood pressure causes heart disease and increases the risk of stroke.  If you are between 78 years and 51 years old, ask your health care provider if  you should take aspirin to prevent strokes.  Have regular diabetes screenings. This involves taking a blood sample to check your fasting blood sugar level.  If you are at a normal weight and have a low risk for diabetes, have this test once every three years after 56 years of age.  If you are overweight and have a high risk for diabetes, consider being tested at a younger age or more often. PREVENTING INFECTION  Hepatitis B  If you have a higher risk for hepatitis B, you should be screened for this virus. You are considered at high risk for hepatitis B if:  You were born in a country where hepatitis B is common. Ask your health care provider which countries are considered high risk.  Your parents were born in a high-risk country, and you have not been immunized against hepatitis B (hepatitis B vaccine).  You have HIV or AIDS.  You use needles to inject street drugs.  You live with someone who has hepatitis B.  You have had sex with someone who has hepatitis B.  You get hemodialysis treatment.  You take certain medicines for conditions, including cancer, organ transplantation, and autoimmune conditions. Hepatitis C  Blood testing is recommended for:  Everyone born from [birth year] through 1967.  Anyone with known risk factors for hepatitis C. Sexually transmitted infections (STIs)  You should be screened for sexually transmitted infections (STIs) including gonorrhea and chlamydia if:  You are sexually active and are younger than 56 years of age.  You are older than 56 years of age and your health care provider tells you that you are at risk for this type of infection.  Your sexual activity has changed since you were last screened and you are at an increased risk for chlamydia or gonorrhea. Ask your health care provider if you are at risk.  If you do not have HIV, but are at risk, it may be recommended that you take a prescription medicine daily to prevent HIV infection. This is  called pre-exposure prophylaxis (PrEP). You are considered at risk if:  You are sexually active and do not regularly use condoms or know the HIV status of your partner(s).  You take drugs by injection.  You are sexually active with a partner who has HIV. Talk with your health care provider about  whether you are at high risk of being infected with HIV. If you choose to begin PrEP, you should first be tested for HIV. You should then be tested every 3 months for as long as you are taking PrEP.  PREGNANCY   If you are premenopausal and you may become pregnant, ask your health care provider about preconception counseling.  If you may become pregnant, take 400 to 800 micrograms (mcg) of folic acid every day.  If you want to prevent pregnancy, talk to your health care provider about birth control (contraception). OSTEOPOROSIS AND MENOPAUSE   Osteoporosis is a disease in which the bones lose minerals and strength with aging. This can result in serious bone fractures. Your risk for osteoporosis can be identified using a bone density scan.  If you are 65 years of age or older, or if you are at risk for osteoporosis and fractures, ask your health care provider if you should be screened.  Ask your health care provider whether you should take a calcium or vitamin D supplement to lower your risk for osteoporosis.  Menopause may have certain physical symptoms and risks.  Hormone replacement therapy may reduce some of these symptoms and risks. Talk to your health care provider about whether hormone replacement therapy is right for you.  HOME CARE INSTRUCTIONS   Schedule regular health, dental, and eye exams.  Stay current with your immunizations.   Do not use any tobacco products including cigarettes, chewing tobacco, or electronic cigarettes.  If you are pregnant, do not drink alcohol.  If you are breastfeeding, limit how much and how often you drink alcohol.  Limit alcohol intake to no more  than 1 drink per day for nonpregnant women. One drink equals 12 ounces of beer, 5 ounces of wine, or 1 ounces of hard liquor.  Do not use street drugs.  Do not share needles.  Ask your health care provider for help if you need support or information about quitting drugs.  Tell your health care provider if you often feel depressed.  Tell your health care provider if you have ever been abused or do not feel safe at home. Document Released: 07/15/2010 Document Revised: 05/16/2013 Document Reviewed: 12/01/2012 ExitCare Patient Information 2015 [employer]. This information is not intended to replace advice given to you by your health care provider. Make sure you discuss any questions you have with your health care provider.  

## 2014-08-25 NOTE — Progress Notes (Signed)
[place] November 15, 1960 [id number]        54 y.o.  [id number] for annual exam.  Several issues noted below.  Past medical history,surgical history, problem list, medications, allergies, family history and social history were all reviewed and documented as reviewed in the EPIC chart.  ROS:  Performed with pertinent positives and negatives included in the history, assessment and plan.   Additional significant findings :  none   Exam: Kim Counsellor Vitals:   08/25/14 1551  BP: 120/74  Height: 5' 2.5" (1.588 m)  Weight: 116 lb (52.617 kg)   General appearance:  Normal affect, orientation and appearance. Skin: Grossly normal HEENT: Without gross lesions.  No cervical or supraclavicular adenopathy. Thyroid normal.  Lungs:  Clear without wheezing, rales or rhonchi Cardiac: RR, without RMG Abdominal:  Soft, nontender, without masses, guarding, rebound, organomegaly or hernia Breasts:  Examined lying and sitting without masses, retractions, discharge or axillary adenopathy. Pelvic:  Ext/BUS/vagina with mild atrophic changes  Cervix normal with mild atrophic changes  Uterus anteverted, normal size, shape and contour, midline and mobile nontender   Adnexa  Without masses or tenderness    Anus and perineum  Normal   Rectovaginal  Normal sphincter tone without palpated masses or tenderness.    Assessment/Plan:  54 y.o. [id number] female for annual exam.   1. Postmenopausal/mild atrophic changes.  Patient continues on Vivelle 0.05 mg patches. Doing well with this. As per my discussion with her psychiatrist we have stopped her progesterone and she is on estrogen only. We have discussed previously the risks benefits of HRT. Increased risk of stroke heart attack DVT and breast cancer reviewed. Patient does much better from a symptoms/depression standpoint on estrogen. We'll continue on the patch for now and plan follow up ultrasound/sonohysterogram in December to assess endometrial  status.patient knows to report any bleeding. 2. Pap smear/HPV negative 2015. No history of abnormal Pap smears previously. Repeat at 3-5 year interval. 3. DEXA 2012 normal. Plan to repeat at age 34.  Increased calcium vitamin D reviewed. 4. Mammography 11/2013. Continue with annual mammography. SBE monthly reviewed. 5. Colonoscopy 2010 with recommended repeat interval 10 years. 6. Health maintenance. No routine blood work done as patient reports recently done through her primary physician's office. I asked her to check to see if they did an HIV and hepatitis C screen as recommended by CDC. If not then she will ask them next time when they do blood work. Follow up in December for ultrasound/sonohysterogram otherwise annual follow up.   Anastasio Auerbach MD, 4:24 PM 08/25/2014

## 2014-08-26 LAB — URINALYSIS W MICROSCOPIC + REFLEX CULTURE
Bilirubin Urine: NEGATIVE
CASTS: NONE SEEN [LPF]
Crystals: NONE SEEN [HPF]
Glucose, UA: NEGATIVE
Hgb urine dipstick: NEGATIVE
KETONES UR: NEGATIVE
LEUKOCYTES UA: NEGATIVE
NITRITE: NEGATIVE
Protein, ur: NEGATIVE
Specific Gravity, Urine: 1.011 (ref 1.001–1.035)
YEAST: NONE SEEN [HPF]
pH: 6 (ref 5.0–8.0)

## 2014-08-27 LAB — URINE CULTURE
COLONY COUNT: NO GROWTH
Organism ID, Bacteria: NO GROWTH

## 2014-08-28 ENCOUNTER — Other Ambulatory Visit: Payer: Self-pay | Admitting: *Deleted

## 2014-08-28 DIAGNOSIS — Z79890 Hormone replacement therapy (postmenopausal): Secondary | ICD-10-CM

## 2014-08-28 MED ORDER — ESTRADIOL 0.05 MG/24HR TD PTTW: 1.0000 | MEDICATED_PATCH | TRANSDERMAL | Status: DC

## 2014-08-28 NOTE — Progress Notes (Signed)
Rx never went to pharmacy, Rx will be resent.

## 2014-09-01 ENCOUNTER — Telehealth: Payer: Self-pay

## 2014-09-01 DIAGNOSIS — Z79890 Hormone replacement therapy (postmenopausal): Secondary | ICD-10-CM

## 2014-09-01 MED ORDER — ESTRADIOL 0.05 MG/24HR TD PTTW: 1.0000 | MEDICATED_PATCH | TRANSDERMAL | Status: DC

## 2014-09-01 NOTE — Telephone Encounter (Signed)
Check with patient to see how she plans on taking it. She want the acyclovir 200 mg daily to suppress the #30 with 12 refills. If she is looking just to treat during an outbreak then I would do acyclovir 200 mg 2 by mouth twice a day 5 days #20 with 5 refills

## 2014-09-01 NOTE — Telephone Encounter (Signed)
Patient said at [place] last week you asked her did she need Rx for acyclovir 200mg  and she declined because a long time since outbreak. She said the day after she had an outbreak. She would like Rx.

## 2014-09-01 NOTE — Telephone Encounter (Signed)
Left message to call and let me know.

## 2014-09-03 ENCOUNTER — Other Ambulatory Visit: Payer: Self-pay | Admitting: Gynecology

## 2014-09-04 MED ORDER — ACYCLOVIR 200 MG PO CAPS: 400.0000 mg | ORAL_CAPSULE | Freq: Two times a day (BID) | ORAL | Status: DC

## 2014-09-04 NOTE — Telephone Encounter (Signed)
I spoke with patient. Her outbreaks are very infrequent and she just wants to take with outbreak. Rx sent.

## 2014-12-22 ENCOUNTER — Telehealth: Payer: Self-pay | Admitting: *Deleted

## 2014-12-22 DIAGNOSIS — Z79890 Hormone replacement therapy (postmenopausal): Secondary | ICD-10-CM

## 2014-12-22 MED ORDER — ESTRADIOL 0.05 MG/24HR TD PTTW: 1.0000 | MEDICATED_PATCH | TRANSDERMAL | Status: DC

## 2014-12-22 NOTE — Telephone Encounter (Signed)
Pt called and said that Rx for minivelle 0.05 mg patch was on back order, I called the pharmacist to confirm, the vivelle-dot 0.05 mg patch is available. Dr. Phineas Real sent vivelle-dot 0.05 mg patch on OV 08/25/14, pharmacy sent a refill request for minivelle on 09/03/14. This is where the Rx was switched, I am going to resent for vivelle-dot 0.05mg  twice weekly since TF originally sent on OV 08/25/14.

## 2014-12-28 ENCOUNTER — Encounter: Payer: Self-pay | Admitting: Gynecology

## 2014-12-29 ENCOUNTER — Encounter: Payer: Self-pay | Admitting: Gynecology

## 2015-03-14 HISTORY — PX: CATARACT EXTRACTION W/ INTRAOCULAR LENS IMPLANT: SHX1309

## 2015-04-17 ENCOUNTER — Telehealth: Payer: Self-pay | Admitting: *Deleted

## 2015-04-17 NOTE — Telephone Encounter (Signed)
Pt was told to follow up for [facility] in December 2016, pt never did this states she has been having issues with her eye vision this is why she did not follow up in dec. Pt said she noticed brownish blood yesterday, small amount on pad, notices more so with wiping. Okay to proceed with scheduling [facility] now?  Please advise

## 2015-04-17 NOTE — Telephone Encounter (Signed)
Send response to donna to schedule [facility]

## 2015-04-17 NOTE — Telephone Encounter (Signed)
Yes to scheduling sonohysterogram

## 2015-04-18 ENCOUNTER — Other Ambulatory Visit: Payer: Self-pay | Admitting: Gynecology

## 2015-04-18 DIAGNOSIS — N950 Postmenopausal bleeding: Secondary | ICD-10-CM

## 2015-04-18 NOTE — Telephone Encounter (Signed)
Patient called to see if Ann Mendez had heard anything from Dr. Loetta Rough.  I called up front and spoke with [facility] and relayed the notes here. She said Butch Penny sent a staff message to her so she was aware. She will call patient now and get her scheduled.

## 2015-04-18 NOTE — Telephone Encounter (Signed)
Sooner than later would be ideal but if she chooses not to be scheduled in the next 2 weeks then I think it is only 1 week later on 05/16/2015

## 2015-04-18 NOTE — Telephone Encounter (Signed)
Per Rosemarie Ax appt scheduled for 05/16/15 and a new order was placed.

## 2015-04-18 NOTE — Telephone Encounter (Signed)
Claudia left message for patient to call so she can offer her a cancellation on April 18.

## 2015-04-18 NOTE — Telephone Encounter (Signed)
Patient called back to check and see if Dr. Loetta Rough thought it okay to wait until 05/16/15 for [facility]. Patient could not come this week or next week. Soonest appt available was 05/16/15. She said her bleeding has increased today and is a bit heavier than yesterday.

## 2015-04-23 ENCOUNTER — Other Ambulatory Visit: Payer: Self-pay | Admitting: Gynecology

## 2015-04-23 DIAGNOSIS — N950 Postmenopausal bleeding: Secondary | ICD-10-CM

## 2015-05-10 ENCOUNTER — Telehealth: Payer: Self-pay | Admitting: Gynecology

## 2015-05-10 NOTE — Telephone Encounter (Signed)
05/10/15-I LM VM for pt that her [employer] ins states they will cover the sonohysterogram/bx if needed under $30 copay. I also told her she would have to sign a waiver in case the benefits change when the claim is submitted to her insurance in light of what happened when she had this done in 2014. [id number]

## 2015-05-16 ENCOUNTER — Encounter: Payer: Self-pay | Admitting: Gynecology

## 2015-05-16 ENCOUNTER — Ambulatory Visit (INDEPENDENT_AMBULATORY_CARE_PROVIDER_SITE_OTHER): Payer: Commercial Managed Care - HMO | Admitting: Gynecology

## 2015-05-16 ENCOUNTER — Ambulatory Visit (INDEPENDENT_AMBULATORY_CARE_PROVIDER_SITE_OTHER): Payer: Commercial Managed Care - HMO

## 2015-05-16 VITALS — BP 122/78

## 2015-05-16 DIAGNOSIS — N950 Postmenopausal bleeding: Secondary | ICD-10-CM

## 2015-05-16 NOTE — Progress Notes (Signed)
    [place] 01-06-1961 [id number]        55 y.o.  [id number] Presents for sonohysterogram. History of HRT for which she was on Vivelle 0.05 mg patches and Prometrium. Having a lot of issues  From a psychiatric standpoint and her psychiatrist suggested we stop her progesterone to see if this doesn't help. We did this in April 2016. She's done well and overall feels better taking estrogen only. She was recommended to follow up for ultrasound/endometrial sampling in December but failed to do so until now.  She had several bleeding episodes in April.  Past medical history,surgical history, problem list, medications, allergies, family history and social history were all reviewed and documented in the EPIC chart.  Directed ROS with pertinent positives and negatives documented in the history of present illness/assessment and plan.  Exam: [place] assistant There were no vitals filed for this visit. General appearance:  Normal Pelvic external BUS vagina with atrophic changes. Cervix normal. Uterus normal size midline mobile nontender. Adnexa without masses or tenderness.  Ultrasound shows uterus to be normal size and echotexture. Endometrial echo 5.4 mm. Right and left ovaries atrophic. Cul-de-sac negative.  Sonohysterogram performed, sterile technique, single-toothed tenaculum anterior lip stabilization with disposable dilator required to place the catheter. Good distention with no abnormalities. Endometrial sample taken. Patient tolerated well.  Assessment/Plan:  55 y.o. [id number] with episode of postmenopausal bleeding on estrogen alone. Will follow up for biopsy results. Assuming normal/acceptable then we'll continue with ERT only for now with follow up surveillance biopsy one year. If continued bleeding and/or hyperplasia them will rediscuss intermittent progesterone.    Anastasio Auerbach MD, 12:07 PM 05/16/2015

## 2015-05-16 NOTE — Patient Instructions (Signed)
Office will call you with biopsy results 

## 2015-06-15 ENCOUNTER — Encounter: Payer: Self-pay | Admitting: Gastroenterology

## 2015-08-27 ENCOUNTER — Ambulatory Visit (INDEPENDENT_AMBULATORY_CARE_PROVIDER_SITE_OTHER): Payer: Commercial Managed Care - HMO | Admitting: Gynecology

## 2015-08-27 ENCOUNTER — Encounter: Payer: Self-pay | Admitting: Gynecology

## 2015-08-27 VITALS — BP 112/66 | Ht 62.0 in | Wt 114.0 lb

## 2015-08-27 DIAGNOSIS — N8500 Endometrial hyperplasia, unspecified: Secondary | ICD-10-CM

## 2015-08-27 DIAGNOSIS — Z01419 Encounter for gynecological examination (general) (routine) without abnormal findings: Secondary | ICD-10-CM | POA: Diagnosis not present

## 2015-08-27 DIAGNOSIS — Z79890 Hormone replacement therapy (postmenopausal): Secondary | ICD-10-CM

## 2015-08-27 DIAGNOSIS — N952 Postmenopausal atrophic vaginitis: Secondary | ICD-10-CM

## 2015-08-27 MED ORDER — ESTRADIOL-NORETHINDRONE ACET 0.05-0.25 MG/DAY TD PTTW: 1.0000 | MEDICATED_PATCH | TRANSDERMAL | 12 refills | Status: DC

## 2015-08-27 NOTE — Patient Instructions (Signed)
Call me in several months to let me know how you're doing with the new hormone regiment

## 2015-08-27 NOTE — Progress Notes (Signed)
    [place] 03-Sep-1960 [id number]        55 y.o.  [id number]  for annual exam.  Several issues noted below.  Past medical history,surgical history, problem list, medications, allergies, family history and social history were all reviewed and documented as reviewed in the EPIC chart.  ROS:  Performed with pertinent positives and negatives included in the history, assessment and plan.   Additional significant findings :  None   Exam: [name] assistant Vitals:   08/27/15 1424  BP: 112/66  Weight: 114 lb (51.7 kg)  Height: 5\' 2"  (1.575 m)   Body mass index is 20.85 kg/m.  General appearance:  Normal affect, orientation and appearance. Skin: Grossly normal HEENT: Without gross lesions.  No cervical or supraclavicular adenopathy. Thyroid normal.  Lungs:  Clear without wheezing, rales or rhonchi Cardiac: RR, without RMG Abdominal:  Soft, nontender, without masses, guarding, rebound, organomegaly or hernia Breasts:  Examined lying and sitting without masses, retractions, discharge or axillary adenopathy. Pelvic:  Ext/BUS/Vagina with atrophic changes  Cervix with atrophic changes  Uterus anteverted, normal size, shape and contour, midline and mobile nontender   Adnexa without masses or tenderness    Anus and perineum normal   Rectovaginal normal sphincter tone without palpated masses or tenderness.    Assessment/Plan:  55 y.o. [id number] female for annual exam.   1. Postmenopausal/atrophic genital changes. On minivelle 0.5 mg patch. Had been on progesterone but stopped due to psychiatric imbalance. His done well off of the progesterone but did have an episode of bleeding for which she underwent a sonohysterogram in May 2017 with endometrial echo 5.4 mm.  Endometrial sampling showed superficial endometrium and in one area there was focal crowding without atypia. Pathologist was unclear whether this was artifact or not. I discussed with her about repeating a biopsy at 6 month  interval. Patient does not want to do this. She asked about adding progesterone back or stopping estrogen altogether to avoid the biopsy. She understands the issues of hyperplasia and progression to atypia ultimately cancer. I am reluctant to stop the estrogen altogether given that she is in a good psychiatric balance right now. She is actually due to see her psychiatrist in several days and going to discuss this with him also but at this point we will go ahead and start her on a combination estrogen progesterone with CombiPatch 0.05/0.25 to see how she does with this. She again understands though the absence of biopsy no guarantees about progression of the hyperplasia and at this point she declines biopsy.  We also reviewed the [facility] 2017 HRT guidelines with increased risk of stroke heart attack DVT possible breast cancer and benefits particulars started early if possible cardiovascular and bone health as well as symptom relief.  Patient is to call me in 1-2 months and follow let me know how she is doing. She knows to call me if she does any further bleeding. 2. Mammography 12/2014. Continue with annual mammography when due. SBE monthly reviewed. 3. Colonoscopy 2010 was reported repeat interval 10 years. 4. DEXA 2012 normal. Plan repeat at age 89. Increased calcium vitamin D. 5. Pap smear/HPV negative 2015. No Pap smear done today. No history of significant abnormal Pap smears previously. 6. Health maintenance. No routine lab work done as this is reportedly done elsewhere. Follow up 1 year, sooner if any issues.   Anastasio Auerbach MD, 2:55 PM 08/27/2015

## 2015-08-31 ENCOUNTER — Telehealth: Payer: Self-pay

## 2015-08-31 NOTE — Telephone Encounter (Signed)
Patient called to ask if we had rebate card for Combipatch. I called her back and left message that we do not have them.

## 2015-09-06 ENCOUNTER — Telehealth: Payer: Self-pay | Admitting: *Deleted

## 2015-09-06 NOTE — Telephone Encounter (Signed)
Pt was prescribed combipatch 0.05/0.25 mg at annual on 08/27/15 cost in $85, she asked if there another combination patch or pill form she could look into? Pt said she is trying to lower the cost of her medications if possible. I explained that we do not know cost of medication, pt verbalized she understood. Please advise

## 2015-09-07 NOTE — Telephone Encounter (Signed)
She could try c  Climara Pro one patch week;y or back to pills estradiol 0.5 mg daily and prometrium 100 mg at bedtime

## 2015-09-07 NOTE — Telephone Encounter (Signed)
Left message for pt to call.

## 2015-09-07 NOTE — Telephone Encounter (Signed)
Pt informed with climara pro patch and estradiol and Prometrium, she asked if there is a combination pill with estrogen/progesterone as well? Her friend takes Prempro, any other pill form combinations would be appreciated. Please advise

## 2015-09-09 NOTE — Telephone Encounter (Signed)
Prempro, activella are 2 combination options.  If she continues to have questions I recommend OV to discuss

## 2015-09-10 NOTE — Telephone Encounter (Signed)
Pt is going to check with insurance and call back.

## 2015-10-05 ENCOUNTER — Other Ambulatory Visit: Payer: Self-pay | Admitting: Gynecology

## 2015-10-05 DIAGNOSIS — Z79890 Hormone replacement therapy (postmenopausal): Secondary | ICD-10-CM

## 2015-10-11 ENCOUNTER — Other Ambulatory Visit: Payer: Self-pay | Admitting: Gynecology

## 2015-10-11 DIAGNOSIS — Z79890 Hormone replacement therapy (postmenopausal): Secondary | ICD-10-CM

## 2015-10-19 ENCOUNTER — Other Ambulatory Visit: Payer: Self-pay | Admitting: Gynecology

## 2015-11-20 ENCOUNTER — Telehealth: Payer: Self-pay | Admitting: *Deleted

## 2015-11-20 MED ORDER — ESTRADIOL-LEVONORGESTREL 0.045-0.015 MG/DAY TD PTWK: 1.0000 | MEDICATED_PATCH | TRANSDERMAL | 9 refills | Status: DC

## 2015-11-20 NOTE — Telephone Encounter (Signed)
Pt price has increased on combipatch 0.05/0.25 mg she was given options on telephone encounter 09/06/15 and has decided to try climara pro patch, asked if Rx could be sent.

## 2015-11-20 NOTE — Telephone Encounter (Signed)
Left on pt voicemail Rx sent. 

## 2015-11-20 NOTE — Telephone Encounter (Signed)
OK for ClimaraPro

## 2016-01-15 MED FILL — SERTRALINE HCL 100 MG TAB: 100 | 30 days supply | Qty: 60 | Fill #0

## 2016-01-21 MED FILL — BUPROPION HCL XL 300 MG TAB: 300 | 30 days supply | Qty: 30 | Fill #0

## 2016-01-28 ENCOUNTER — Ambulatory Visit (INDEPENDENT_AMBULATORY_CARE_PROVIDER_SITE_OTHER): Payer: 59 | Admitting: Sports Medicine

## 2016-01-28 ENCOUNTER — Encounter: Payer: Self-pay | Admitting: Sports Medicine

## 2016-01-28 VITALS — BP 120/82 | Ht 62.0 in | Wt 116.0 lb

## 2016-01-28 DIAGNOSIS — M25561 Pain in right knee: Secondary | ICD-10-CM

## 2016-01-28 MED FILL — ACYCLOVIR 200 MG CAPSULE: 200 | 5 days supply | Qty: 20 | Fill #0

## 2016-01-28 MED FILL — GABAPENTIN 400 MG CAPSULE: 400 | 30 days supply | Qty: 60 | Fill #0

## 2016-01-28 NOTE — Progress Notes (Signed)
   Subjective:    Patient ID: Ann Mendez, female    DOB: 1960/07/01, 56 y.o.   MRN: [id number]  HPI chief complaint: Right knee pain  Very pleasant 56 year old female comes in today complaining of sudden onset knee pain that began a few days ago. Her pain began without any known trauma. She was noticing some medial sided knee pain and stiffness a couple of days prior to this event. She was then sitting in a recliner and had significant pain when getting up. Her pain was worse with trying to walk down stairs. Her symptoms have now completely subsided. She has had similar issues with this knee in the past. She saw Dr. Junius Roads 15 years ago and got a cortisone injection which helped for many years. This was after an MRI revealed a medial meniscal tear. She was symptom-free up until about a year ago. At that time, she began to notice difficulty with full knee flexion when in a squatted position. However, that was tolerable up until a few days ago when she had her onset of pain. She does admit to some episodes of locking of the knee. The knee will also give way. She has not had any recent imaging. She has not noticed any swelling in the knee but has noted some right lower extremity swelling on occasion. She denies any prior knee surgery. No hip pain. Again, she is completely symptom free today.  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 No acute distress. Awake alert and oriented 3. Vital signs reviewed  Right knee: Full range of motion. No effusion. 2+ patellofemoral crepitus. She is tender to palpation along the medial joint line. No tenderness along the lateral joint line. Positive Thessaly's. Knee is grossly stable to ligamentous exam. Neurovascularly intact distally. Walking  without a limp.  MSK ultrasound of the right knee was performed. Limited images were obtained. There is no obvious joint effusion. Visualized portion of the medial meniscus shows some hypoechoic changes in the periphery as well as some spurring off the medial tibia. 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      Assessment & Plan:   Right knee pain likely secondary to medial compartmental DJD versus degenerative medial meniscal tear  I had a long talk with the patient regarding her treatment options. She did get a cortisone injection 15 years ago which helped her tremendously. We are going to start with having her do some isometric quadriceps strengthening exercises and using when necessary over-the-counter anti-inflammatories. I would like to see her back in the office in 4-6 weeks. I would be happy to reinject her knee if her symptoms persist or worsen. We also discussed the possibility of further diagnostic imaging if a repeat injection does not help.

## 2016-02-05 ENCOUNTER — Ambulatory Visit: Payer: Self-pay | Admitting: Sports Medicine

## 2016-02-11 MED FILL — SERTRALINE HCL 100 MG TAB: 100 | 30 days supply | Qty: 60 | Fill #1

## 2016-02-11 MED FILL — CLIMARA PRO PATCH: 0.045-0.015 | 28 days supply | Qty: 4 | Fill #0

## 2016-02-13 ENCOUNTER — Telehealth: Payer: Self-pay | Admitting: *Deleted

## 2016-02-13 DIAGNOSIS — N950 Postmenopausal bleeding: Secondary | ICD-10-CM

## 2016-02-13 NOTE — Telephone Encounter (Signed)
I would recommend sonohysterogram to rule out intracavitary abnormalities.

## 2016-02-13 NOTE — Telephone Encounter (Signed)
Left a detailed message on voicemail to schedule [facility], order placed.

## 2016-02-13 NOTE — Telephone Encounter (Signed)
Pt called to follow up with climara pro patch 0.045/0.015 mg was prescribed in Nov. 2017 pt states x 2 months she has had bleeding daily, only notes not bleeding 7 days out of each month. 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 Pt told to call if bleeding should occur, OV offered, pt wanted me to check with you first. Please advise

## 2016-02-15 ENCOUNTER — Encounter: Payer: Self-pay | Admitting: Gynecology

## 2016-02-15 DIAGNOSIS — Z1231 Encounter for screening mammogram for malignant neoplasm of breast: Secondary | ICD-10-CM | POA: Diagnosis not present

## 2016-02-18 MED FILL — BUPROPION HCL XL 300 MG TAB: 300 | 30 days supply | Qty: 30 | Fill #1

## 2016-02-20 DIAGNOSIS — F332 Major depressive disorder, recurrent severe without psychotic features: Secondary | ICD-10-CM | POA: Diagnosis not present

## 2016-02-20 DIAGNOSIS — F411 Generalized anxiety disorder: Secondary | ICD-10-CM | POA: Diagnosis not present

## 2016-02-22 ENCOUNTER — Telehealth: Payer: Self-pay | Admitting: *Deleted

## 2016-02-22 NOTE — Telephone Encounter (Signed)
Pt called to follow up telephone encounter 02/13/16, received my voicemail about scheduling [facility]. Pt said she can't afford another [facility], states she just finish paying off the one from last year. Pt asked if any other options? Please advise

## 2016-02-22 NOTE — Telephone Encounter (Signed)
Alternative would be to do an endometrial biopsy only to rule out hyperplasia and forego the ultrasound.

## 2016-02-25 MED FILL — CONCERTA 54 MG TABLET ER: 54 | 30 days supply | Qty: 30 | Fill #0

## 2016-02-25 MED FILL — ACYCLOVIR 200 MG CAPSULE: 200 | 5 days supply | Qty: 20 | Fill #1

## 2016-02-25 NOTE — Telephone Encounter (Signed)
Pt informed with the below note, would like to research Prempro as an option. I explained to patient patient once again the recommendations, she would like to maybe switch medication rather then start with procedures. Pt will call me back.

## 2016-02-27 MED FILL — GABAPENTIN 400 MG CAPSULE: 400 | 30 days supply | Qty: 60 | Fill #1

## 2016-03-07 MED FILL — SODIUM CHLORIDE 0.9% INHAL: 0.9 | 30 days supply | Qty: 90 | Fill #0

## 2016-03-10 MED FILL — SERTRALINE HCL 100 MG TAB: 100 | 30 days supply | Qty: 60 | Fill #2

## 2016-03-11 MED FILL — ARIPiprazole 5 MG TABS: 5 | 30 days supply | Qty: 30 | Fill #0

## 2016-03-18 MED FILL — BUPROPION HCL XL 300 MG TAB: 300 | 30 days supply | Qty: 30 | Fill #2

## 2016-03-21 ENCOUNTER — Encounter: Payer: Self-pay | Admitting: *Deleted

## 2016-03-21 ENCOUNTER — Telehealth: Payer: Self-pay | Admitting: *Deleted

## 2016-03-21 NOTE — Telephone Encounter (Signed)
Sent pt a mychart message. 

## 2016-03-21 NOTE — Telephone Encounter (Signed)
Disclaimer is still cannot rule out pathology causing the bleeding without further investigation.

## 2016-03-21 NOTE — Telephone Encounter (Signed)
Just FYI pt called asked me to relay to you that she has stopped climara pro patch and bleeding has stopped and no symptoms as well.

## 2016-03-25 MED FILL — CONCERTA 54 MG TABLET ER: 54 | 30 days supply | Qty: 30 | Fill #0

## 2016-03-31 DIAGNOSIS — F411 Generalized anxiety disorder: Secondary | ICD-10-CM | POA: Diagnosis not present

## 2016-03-31 DIAGNOSIS — F332 Major depressive disorder, recurrent severe without psychotic features: Secondary | ICD-10-CM | POA: Diagnosis not present

## 2016-04-02 MED FILL — ACYCLOVIR 200 MG CAPSULE: 200 | 5 days supply | Qty: 20 | Fill #2

## 2016-04-02 MED FILL — GABAPENTIN 400 MG CAPSULE: 400 | 30 days supply | Qty: 60 | Fill #2

## 2016-04-08 MED FILL — SERTRALINE HCL 100 MG TAB: 100 | 30 days supply | Qty: 60 | Fill #3

## 2016-04-15 MED FILL — BUPROPION HCL XL 300 MG TAB: 300 | 30 days supply | Qty: 30 | Fill #3

## 2016-04-25 MED FILL — CONCERTA 54 MG TABLET ER: 54 | 30 days supply | Qty: 30 | Fill #0

## 2016-05-01 MED FILL — GABAPENTIN 400 MG CAPSULE: 400 | 30 days supply | Qty: 60 | Fill #3

## 2016-05-12 MED FILL — SERTRALINE HCL 100 MG TAB: 100 | 30 days supply | Qty: 60 | Fill #4

## 2016-05-14 DIAGNOSIS — F411 Generalized anxiety disorder: Secondary | ICD-10-CM | POA: Diagnosis not present

## 2016-05-14 DIAGNOSIS — F332 Major depressive disorder, recurrent severe without psychotic features: Secondary | ICD-10-CM | POA: Diagnosis not present

## 2016-05-19 MED FILL — BUPROPION HCL XL 300 MG TAB: 300 | 30 days supply | Qty: 30 | Fill #4

## 2016-05-26 MED FILL — ARIPiprazole 5 MG TABS: 5 | 30 days supply | Qty: 30 | Fill #1

## 2016-05-27 MED FILL — CONCERTA 54 MG TABLET ER: 54 | 30 days supply | Qty: 30 | Fill #0

## 2016-06-02 MED FILL — GABAPENTIN 400 MG CAPSULE: 400 | 30 days supply | Qty: 60 | Fill #4

## 2016-06-10 DIAGNOSIS — F332 Major depressive disorder, recurrent severe without psychotic features: Secondary | ICD-10-CM | POA: Diagnosis not present

## 2016-06-10 DIAGNOSIS — F411 Generalized anxiety disorder: Secondary | ICD-10-CM | POA: Diagnosis not present

## 2016-06-11 DIAGNOSIS — Z1211 Encounter for screening for malignant neoplasm of colon: Secondary | ICD-10-CM | POA: Diagnosis not present

## 2016-06-11 DIAGNOSIS — N952 Postmenopausal atrophic vaginitis: Secondary | ICD-10-CM | POA: Diagnosis not present

## 2016-06-11 DIAGNOSIS — L089 Local infection of the skin and subcutaneous tissue, unspecified: Secondary | ICD-10-CM | POA: Diagnosis not present

## 2016-06-11 DIAGNOSIS — Z0000 Encounter for general adult medical examination without abnormal findings: Secondary | ICD-10-CM | POA: Diagnosis not present

## 2016-06-11 DIAGNOSIS — E559 Vitamin D deficiency, unspecified: Secondary | ICD-10-CM | POA: Diagnosis not present

## 2016-06-11 DIAGNOSIS — G4484 Primary exertional headache: Secondary | ICD-10-CM | POA: Diagnosis not present

## 2016-06-11 MED FILL — PREMARIN VAGINAL CREAM-APPL: 0.625 | 30 days supply | Qty: 30 | Fill #0

## 2016-06-12 DIAGNOSIS — Z0000 Encounter for general adult medical examination without abnormal findings: Secondary | ICD-10-CM | POA: Diagnosis not present

## 2016-06-12 DIAGNOSIS — E039 Hypothyroidism, unspecified: Secondary | ICD-10-CM | POA: Diagnosis not present

## 2016-06-12 DIAGNOSIS — Z1159 Encounter for screening for other viral diseases: Secondary | ICD-10-CM | POA: Diagnosis not present

## 2016-06-12 DIAGNOSIS — N952 Postmenopausal atrophic vaginitis: Secondary | ICD-10-CM | POA: Diagnosis not present

## 2016-06-12 DIAGNOSIS — Z1211 Encounter for screening for malignant neoplasm of colon: Secondary | ICD-10-CM | POA: Diagnosis not present

## 2016-06-12 DIAGNOSIS — E559 Vitamin D deficiency, unspecified: Secondary | ICD-10-CM | POA: Diagnosis not present

## 2016-06-12 DIAGNOSIS — L089 Local infection of the skin and subcutaneous tissue, unspecified: Secondary | ICD-10-CM | POA: Diagnosis not present

## 2016-06-12 DIAGNOSIS — E7800 Pure hypercholesterolemia, unspecified: Secondary | ICD-10-CM | POA: Diagnosis not present

## 2016-06-12 DIAGNOSIS — Z8759 Personal history of other complications of pregnancy, childbirth and the puerperium: Secondary | ICD-10-CM | POA: Diagnosis not present

## 2016-06-12 MED FILL — SERTRALINE HCL 100 MG TAB: 100 | 30 days supply | Qty: 60 | Fill #5

## 2016-06-18 MED FILL — BUPROPION HCL XL 300 MG TAB: 300 | 30 days supply | Qty: 30 | Fill #5

## 2016-06-27 MED FILL — CONCERTA 54 MG TABLET ER: 54 | 30 days supply | Qty: 30 | Fill #0

## 2016-06-29 DIAGNOSIS — M25561 Pain in right knee: Secondary | ICD-10-CM | POA: Diagnosis not present

## 2016-07-02 MED FILL — GABAPENTIN 400 MG CAPSULE: 400 | 30 days supply | Qty: 60 | Fill #5

## 2016-07-02 MED FILL — SODIUM CHLORIDE 0.9% INHAL: 0.9 | 30 days supply | Qty: 90 | Fill #1

## 2016-07-08 ENCOUNTER — Encounter: Payer: Self-pay | Admitting: Sports Medicine

## 2016-07-08 ENCOUNTER — Ambulatory Visit: Payer: Self-pay

## 2016-07-08 ENCOUNTER — Ambulatory Visit (INDEPENDENT_AMBULATORY_CARE_PROVIDER_SITE_OTHER): Payer: 59 | Admitting: Sports Medicine

## 2016-07-08 VITALS — BP 130/78 | Ht 62.0 in | Wt 115.0 lb

## 2016-07-08 DIAGNOSIS — M25561 Pain in right knee: Secondary | ICD-10-CM

## 2016-07-09 DIAGNOSIS — F411 Generalized anxiety disorder: Secondary | ICD-10-CM | POA: Diagnosis not present

## 2016-07-09 DIAGNOSIS — F332 Major depressive disorder, recurrent severe without psychotic features: Secondary | ICD-10-CM | POA: Diagnosis not present

## 2016-07-09 DIAGNOSIS — F4310 Post-traumatic stress disorder, unspecified: Secondary | ICD-10-CM | POA: Diagnosis not present

## 2016-07-10 DIAGNOSIS — M25561 Pain in right knee: Secondary | ICD-10-CM | POA: Insufficient documentation

## 2016-07-10 NOTE — Assessment & Plan Note (Addendum)
Likely she has a component of a meniscal tear with bruising of the meniscus as well.  - provided home exercises  - compression  - f/u PRN

## 2016-07-10 NOTE — Progress Notes (Signed)
  Ann Mendez - 56 y.o. female MRN [id number]  Date of birth: August 09, 1960  SUBJECTIVE:  Including CC & ROS.   Ann Mendez is a 56 yo F that is presenting with right knee pain. The pain started about two weeks ago. She had similar pain in [place] and that resolved. She was jumping on a trampoline for a work out and that knee pain started after that. She has been taking ibuprofen and using ice. She went to the [facility] and received an injection on 6/13. The injection didn't help her pain at all. She has ome buckling and the pain is medial. Pain is worse with driving and going up and down stairs.    ROS: No unexpected weight loss, fever, chills, muscle pain, numbness/tingling, redness, otherwise see HPI   HISTORY: Past Medical, Surgical, Social, and Family History Reviewed & Updated per EMR.   Pertinent Historical Findings include: PMHx: spinal stenosis, depression Social:  No tobacco use  FHx: HTN, heart disease   DATA REVIEWED: none  PHYSICAL EXAM:  VS: BP 130/78   Ht 5\' 2"  (1.575 m)   Wt 115 lb (52.2 kg)   LMP 10/16/2011   BMI 21.03 kg/m  PHYSICAL EXAM: Gen: NAD, alert, cooperative with exam, well-appearing HEENT: clear conjunctiva, EOMI CV:  no edema, capillary refill brisk,  Resp: non-labored, normal speech Skin: no rashes, normal turgor  Neuro: no gross deficits.  Psych:  alert and oriented MSK:  Right knee:  No obvious effusion.  No TTP of quad or patellar tendon  Pain with full flexion  Positive McMurray's test.  Negative Lauchman's  No instability with valgus or varus Neurovascularly intact.   Limited ultrasound: right knee:  Trace fluid in SPP  Normal QT and PT  Medial meniscus with outpouching and changes suggestive of a tear   Summary: Likely a degenerative meniscal tear of the anterior horn of the medial meniscus with outpouching    Ultrasound and interpretation by Clearance Coots, MD   ASSESSMENT & PLAN:   Right knee pain Likely she  has a component of a meniscal tear with bruising of the meniscus as well.  - provided home exercises  - compression  - f/u PRN

## 2016-07-14 MED FILL — SERTRALINE HCL 100 MG TAB: 100 | 30 days supply | Qty: 60 | Fill #0

## 2016-07-17 MED FILL — BUPROPION HCL XL 300 MG TAB: 300 | 30 days supply | Qty: 30 | Fill #0

## 2016-07-17 MED FILL — ARIPiprazole 5 MG TABS: 5 | 30 days supply | Qty: 30 | Fill #2

## 2016-07-25 MED FILL — CONCERTA 54 MG TABLET ER: 54 | 30 days supply | Qty: 30 | Fill #0

## 2016-07-29 ENCOUNTER — Other Ambulatory Visit: Payer: Self-pay | Admitting: Gynecology

## 2016-07-29 MED FILL — ACYCLOVIR 200 MG CAPSULE: 200 | 8 days supply | Qty: 30 | Fill #0

## 2016-07-30 MED FILL — GABAPENTIN 400 MG CAPSULE: 400 | 30 days supply | Qty: 60 | Fill #0

## 2016-08-08 DIAGNOSIS — S0501XA Injury of conjunctiva and corneal abrasion without foreign body, right eye, initial encounter: Secondary | ICD-10-CM | POA: Diagnosis not present

## 2016-08-08 MED FILL — NEO/POLYMYXIN/DEXAMETH DROP: 3.5-10000-0 | 25 days supply | Qty: 5 | Fill #0

## 2016-08-11 MED FILL — SERTRALINE HCL 100 MG TAB: 100 | 30 days supply | Qty: 60 | Fill #1

## 2016-08-11 MED FILL — BUPROPION HCL XL 300 MG TAB: 300 | 30 days supply | Qty: 30 | Fill #1

## 2016-08-22 MED FILL — CONCERTA 54 MG TABLET ER: 54 | 30 days supply | Qty: 30 | Fill #0

## 2016-09-02 MED FILL — GABAPENTIN 400 MG CAPSULE: 400 | 30 days supply | Qty: 60 | Fill #1

## 2016-09-09 ENCOUNTER — Encounter: Payer: Commercial Managed Care - HMO | Admitting: Gynecology

## 2016-09-11 MED FILL — SERTRALINE HCL 100 MG TAB: 100 | 30 days supply | Qty: 60 | Fill #2

## 2016-09-11 MED FILL — ARIPiprazole 5 MG TABS: 5 | 30 days supply | Qty: 30 | Fill #3

## 2016-09-11 MED FILL — BUPROPION HCL XL 300 MG TAB: 300 | 30 days supply | Qty: 30 | Fill #2

## 2016-09-23 ENCOUNTER — Encounter: Payer: Self-pay | Admitting: Gynecology

## 2016-09-23 ENCOUNTER — Ambulatory Visit (INDEPENDENT_AMBULATORY_CARE_PROVIDER_SITE_OTHER): Payer: 59 | Admitting: Gynecology

## 2016-09-23 VITALS — BP 132/80 | Ht 62.0 in | Wt 116.0 lb

## 2016-09-23 DIAGNOSIS — N941 Unspecified dyspareunia: Secondary | ICD-10-CM | POA: Diagnosis not present

## 2016-09-23 DIAGNOSIS — Z01411 Encounter for gynecological examination (general) (routine) with abnormal findings: Secondary | ICD-10-CM | POA: Diagnosis not present

## 2016-09-23 DIAGNOSIS — N952 Postmenopausal atrophic vaginitis: Secondary | ICD-10-CM | POA: Diagnosis not present

## 2016-09-23 DIAGNOSIS — A609 Anogenital herpesviral infection, unspecified: Secondary | ICD-10-CM | POA: Diagnosis not present

## 2016-09-23 MED ORDER — ESTROGENS, CONJUGATED 0.625 MG/GM VA CREA: TOPICAL_CREAM | VAGINAL | 12 refills | Status: DC

## 2016-09-23 MED FILL — CONCERTA 54 MG TABLET ER: 54 | 30 days supply | Qty: 30 | Fill #0

## 2016-09-23 NOTE — Patient Instructions (Signed)
Call if you have any issues with the vaginal estrogen treatment.  Follow up in one year for annual exam

## 2016-09-23 NOTE — Progress Notes (Signed)
[name] 10/25/1960 [id number]        56 y.o.  [id number] for annual gynecologic exam.  Also wants to discuss her hormone replacement and dyspareunia.  Past medical history,surgical history, problem list, medications, allergies, family history and social history were all reviewed and documented as reviewed in the EPIC chart.  ROS:  Performed with pertinent positives and negatives included in the history, assessment and plan.   Additional significant findings :  None   Exam: Ann Mendez assistant Vitals:   09/23/16 0939  BP: 132/80  Weight: 116 lb (52.6 kg)  Height: 5\' 2"  (1.194 m)   Body mass index is 21.22 kg/m.  General appearance:  Normal affect, orientation and appearance. Skin: Grossly normal HEENT: Without gross lesions.  No cervical or supraclavicular adenopathy. Thyroid normal.  Lungs:  Clear without wheezing, rales or rhonchi Cardiac: RR, without RMG Abdominal:  Soft, nontender, without masses, guarding, rebound, organomegaly or hernia Breasts:  Examined lying and sitting without masses, retractions, discharge or axillary adenopathy. Pelvic:  Ext, BUS, Vagina: With atrophic changes  Cervix: With atrophic changes  Uterus: Anteverted, normal size, shape and contour, midline and mobile nontender   Adnexa: Without masses or tenderness    Anus and perineum: Normal   Rectovaginal: Normal sphincter tone without palpated masses or tenderness.    Assessment/Plan:  56 y.o. [id number] female for annual gynecologic exam.   1. Postmenopausal/atrophic genital changes. Patient previously had been on HRT to include estradiol patch and progesterone. She ultimately stopped the progesterone due to psychiatric interference. She underwent endometrial evaluation due to bleeding which showed endometrial echo 5.4 mm and superficial endometrium. There was one area they noted focal crowding with out atypia. She subsequently switched to CombiPatch but had some bleeding with this and  ultimately stopped and reports now that she is doing well without hot flushes, night sweats or psychiatric input. She is having significant issues with vaginal dryness and dyspareunia. She's tried OTC products without success. Her primary physician had recommended Premarin which she has been using at 0.5 g twice weekly but does not notice any benefit. I reviewed the whole issue of vaginal support for dyspareunia and dryness. As OTC products were unhelpful I reviewed estrogen to include ring, cream, tablets and Osphena. The pros and cons of each choice reviewed. We discussed the issues of absorption with systemic effects to include thrombosis, breast issues and endometrial stimulation also reviewed. After a lengthy discussion she prefers to continue on Premarin but we will adjust her dosage to 2 g twice weekly. I discussed that she should not expect to see an effect for one to 2 months but if she does not notice improvement by then she needs to call me. Assuming she has improvement and is doing well then I suggested to back down to 1 g twice weekly and see how she does with that. Premarin refill 1 year provided. 2. History of recurrent HSV outbreaks. Notes that they are a lot less frequent since she stopped her CombiPatch. Is using acyclovir 200 mg tablets 2 twice daily with outbreak times several days. I reviewed with her the options to include switching to Valtrex. She is comfortable with the acyclovir now she is afraid any switch will interfere with her other psychiatric medications. Refill for her acyclovir provided #30 with 2 refills 3. Pap smear/HPV 2015 negative. No Pap smear done today. No history of significant abnormal Pap smears. Plan repeat Pap smear at 5 year interval per current screening guidelines. 4.  DEXA 2012 normal. Plan repeat DEXA at age 67. Increased calcium vitamin D. 5. Mammography 02/2016. Continue with annual mammography when due. Breast exam normal today. 6. Colonoscopy 2010 with plan  to repeat interval 56 years of age 48. 7. Health maintenance. No routine lab work done as patient does this elsewhere. Follow up in one year, sooner as needed.   Additional time in excess of her routine gynecologic exam was spent in direct face to face counseling and coordination of care in regards to her dyspareunia, treatment options and ultimate prescription provided.Ann Auerbach MD, 10:15 AM 09/23/2016

## 2016-10-01 MED FILL — GABAPENTIN 400 MG CAPSULE: 400 | 30 days supply | Qty: 60 | Fill #2

## 2016-10-09 MED FILL — SERTRALINE HCL 100 MG TAB: 100 | 30 days supply | Qty: 60 | Fill #3

## 2016-10-14 MED FILL — BUPROPION HCL XL 300 MG TAB: 300 | 30 days supply | Qty: 30 | Fill #3

## 2016-10-21 MED FILL — CONCERTA 54 MG TABLET ER: 54 | 30 days supply | Qty: 30 | Fill #0

## 2016-10-27 DIAGNOSIS — F411 Generalized anxiety disorder: Secondary | ICD-10-CM | POA: Diagnosis not present

## 2016-10-27 DIAGNOSIS — F332 Major depressive disorder, recurrent severe without psychotic features: Secondary | ICD-10-CM | POA: Diagnosis not present

## 2016-10-27 MED FILL — PREMARIN VAGINAL CREAM-APPL: 0.625 | 30 days supply | Qty: 30 | Fill #1

## 2016-10-29 MED FILL — GABAPENTIN 400 MG CAPSULE: 400 | 90 days supply | Qty: 180 | Fill #0

## 2016-11-07 MED FILL — SERTRALINE HCL 100 MG TAB: 100 | 90 days supply | Qty: 180 | Fill #0

## 2016-11-12 MED FILL — ARIPiprazole 5 MG TABS: 5 | 90 days supply | Qty: 45 | Fill #0

## 2016-11-12 MED FILL — BUPROPION HCL XL 300 MG TAB: 300 | 90 days supply | Qty: 90 | Fill #0

## 2016-11-24 DIAGNOSIS — F332 Major depressive disorder, recurrent severe without psychotic features: Secondary | ICD-10-CM | POA: Diagnosis not present

## 2016-11-24 DIAGNOSIS — F411 Generalized anxiety disorder: Secondary | ICD-10-CM | POA: Diagnosis not present

## 2016-11-24 MED FILL — CONCERTA 54 MG TABLET ER: 54 | 30 days supply | Qty: 30 | Fill #0

## 2016-12-08 DIAGNOSIS — M25561 Pain in right knee: Secondary | ICD-10-CM | POA: Diagnosis not present

## 2016-12-10 MED FILL — SODIUM CHLORIDE 0.9% INHAL: 0.9 | 30 days supply | Qty: 90 | Fill #2

## 2016-12-13 DIAGNOSIS — M25561 Pain in right knee: Secondary | ICD-10-CM | POA: Diagnosis not present

## 2016-12-13 DIAGNOSIS — M94261 Chondromalacia, right knee: Secondary | ICD-10-CM

## 2016-12-13 DIAGNOSIS — S83289A Other tear of lateral meniscus, current injury, unspecified knee, initial encounter: Secondary | ICD-10-CM

## 2016-12-13 HISTORY — DX: Other tear of lateral meniscus, current injury, unspecified knee, initial encounter: S83.289A

## 2016-12-13 HISTORY — DX: Chondromalacia, right knee: M94.261

## 2016-12-15 DIAGNOSIS — S83241A Other tear of medial meniscus, current injury, right knee, initial encounter: Secondary | ICD-10-CM | POA: Diagnosis not present

## 2016-12-23 DIAGNOSIS — F411 Generalized anxiety disorder: Secondary | ICD-10-CM | POA: Diagnosis not present

## 2016-12-23 DIAGNOSIS — F332 Major depressive disorder, recurrent severe without psychotic features: Secondary | ICD-10-CM | POA: Diagnosis not present

## 2016-12-26 MED FILL — CONCERTA 54 MG TABLET ER: 54 | 30 days supply | Qty: 30 | Fill #0

## 2016-12-29 ENCOUNTER — Other Ambulatory Visit: Payer: Self-pay

## 2016-12-29 ENCOUNTER — Encounter (HOSPITAL_BASED_OUTPATIENT_CLINIC_OR_DEPARTMENT_OTHER): Payer: Self-pay | Admitting: *Deleted

## 2016-12-29 ENCOUNTER — Other Ambulatory Visit: Payer: Self-pay | Admitting: Orthopedic Surgery

## 2017-01-01 ENCOUNTER — Ambulatory Visit (HOSPITAL_BASED_OUTPATIENT_CLINIC_OR_DEPARTMENT_OTHER): Payer: 59 | Admitting: Anesthesiology

## 2017-01-01 ENCOUNTER — Encounter (HOSPITAL_BASED_OUTPATIENT_CLINIC_OR_DEPARTMENT_OTHER)
Admission: RE | Disposition: A | Discharge status: Home / Self Care | Payer: Self-pay | Source: Ambulatory Visit | Attending: Orthopedic Surgery

## 2017-01-01 ENCOUNTER — Encounter (HOSPITAL_BASED_OUTPATIENT_CLINIC_OR_DEPARTMENT_OTHER): Payer: Self-pay | Admitting: *Deleted

## 2017-01-01 ENCOUNTER — Other Ambulatory Visit: Payer: Self-pay

## 2017-01-01 ENCOUNTER — Ambulatory Visit (HOSPITAL_BASED_OUTPATIENT_CLINIC_OR_DEPARTMENT_OTHER)
Admission: RE | Admit: 2017-01-01 | Discharge: 2017-01-01 | Disposition: A | Discharge status: Home / Self Care | Payer: 59 | Source: Ambulatory Visit | Attending: Orthopedic Surgery | Admitting: Orthopedic Surgery

## 2017-01-01 DIAGNOSIS — F329 Major depressive disorder, single episode, unspecified: Secondary | ICD-10-CM | POA: Insufficient documentation

## 2017-01-01 DIAGNOSIS — Z8249 Family history of ischemic heart disease and other diseases of the circulatory system: Secondary | ICD-10-CM | POA: Diagnosis not present

## 2017-01-01 DIAGNOSIS — M4800 Spinal stenosis, site unspecified: Secondary | ICD-10-CM | POA: Diagnosis not present

## 2017-01-01 DIAGNOSIS — Z79899 Other long term (current) drug therapy: Secondary | ICD-10-CM | POA: Diagnosis not present

## 2017-01-01 DIAGNOSIS — M23241 Derangement of anterior horn of lateral meniscus due to old tear or injury, right knee: Secondary | ICD-10-CM | POA: Diagnosis not present

## 2017-01-01 DIAGNOSIS — M23231 Derangement of other medial meniscus due to old tear or injury, right knee: Secondary | ICD-10-CM | POA: Diagnosis not present

## 2017-01-01 DIAGNOSIS — M2241 Chondromalacia patellae, right knee: Secondary | ICD-10-CM | POA: Insufficient documentation

## 2017-01-01 DIAGNOSIS — S83241A Other tear of medial meniscus, current injury, right knee, initial encounter: Secondary | ICD-10-CM | POA: Diagnosis not present

## 2017-01-01 DIAGNOSIS — Z882 Allergy status to sulfonamides status: Secondary | ICD-10-CM | POA: Insufficient documentation

## 2017-01-01 DIAGNOSIS — F419 Anxiety disorder, unspecified: Secondary | ICD-10-CM | POA: Insufficient documentation

## 2017-01-01 DIAGNOSIS — S83281A Other tear of lateral meniscus, current injury, right knee, initial encounter: Secondary | ICD-10-CM | POA: Diagnosis not present

## 2017-01-01 DIAGNOSIS — S83231A Complex tear of medial meniscus, current injury, right knee, initial encounter: Secondary | ICD-10-CM | POA: Diagnosis present

## 2017-01-01 DIAGNOSIS — M19072 Primary osteoarthritis, left ankle and foot: Secondary | ICD-10-CM | POA: Diagnosis not present

## 2017-01-01 HISTORY — DX: Complex tear of medial meniscus, current injury, right knee, initial encounter: S83.231A

## 2017-01-01 HISTORY — DX: Anxiety disorder, unspecified: F41.9

## 2017-01-01 HISTORY — DX: Unspecified osteoarthritis, unspecified site: M19.90

## 2017-01-01 HISTORY — DX: Chondromalacia, right knee: M94.261

## 2017-01-01 HISTORY — DX: Other tear of lateral meniscus, current injury, unspecified knee, initial encounter: S83.289A

## 2017-01-01 HISTORY — PX: KNEE ARTHROSCOPY: SHX127

## 2017-01-01 SURGERY — ARTHROSCOPY, KNEE
Anesthesia: General | Site: Knee | Laterality: Right

## 2017-01-01 MED ORDER — ONDANSETRON HCL 4 MG PO TABS: 4.0000 mg | ORAL_TABLET | Freq: Three times a day (TID) | ORAL | 0 refills | Status: DC | PRN

## 2017-01-01 MED ORDER — SODIUM CHLORIDE 0.9 % IR SOLN
Status: DC | PRN
  Administered 2017-01-01: 2000 mL

## 2017-01-01 MED ORDER — LIDOCAINE 2% (20 MG/ML) 5 ML SYRINGE
INTRAMUSCULAR | Status: AC
  Filled 2017-01-01: qty 5

## 2017-01-01 MED ORDER — FENTANYL CITRATE (PF) 100 MCG/2ML IJ SOLN: 25.0000 ug | INTRAMUSCULAR | Status: DC | PRN

## 2017-01-01 MED ORDER — HYDROCODONE-ACETAMINOPHEN 5-325 MG PO TABS: 1.0000 | ORAL_TABLET | Freq: Once | ORAL | Status: DC

## 2017-01-01 MED ORDER — ACETAMINOPHEN 160 MG/5ML PO SOLN: 325.0000 mg | ORAL | Status: DC | PRN

## 2017-01-01 MED ORDER — CHLORHEXIDINE GLUCONATE 4 % EX LIQD: 60.0000 mL | Freq: Once | CUTANEOUS | Status: DC

## 2017-01-01 MED ORDER — HYDROCODONE-ACETAMINOPHEN 10-325 MG PO TABS: 1.0000 | ORAL_TABLET | Freq: Four times a day (QID) | ORAL | 0 refills | Status: DC | PRN

## 2017-01-01 MED ORDER — CEFAZOLIN SODIUM-DEXTROSE 2-4 GM/100ML-% IV SOLN
2.0000 g | INTRAVENOUS | Status: AC
  Administered 2017-01-01: 2 g via INTRAVENOUS

## 2017-01-01 MED ORDER — PROPOFOL 10 MG/ML IV BOLUS
INTRAVENOUS | Status: DC | PRN
  Administered 2017-01-01: 150 mg via INTRAVENOUS

## 2017-01-01 MED ORDER — MEPERIDINE HCL 25 MG/ML IJ SOLN: 6.2500 mg | INTRAMUSCULAR | Status: DC | PRN

## 2017-01-01 MED ORDER — CEFAZOLIN SODIUM-DEXTROSE 2-4 GM/100ML-% IV SOLN
INTRAVENOUS | Status: AC
  Filled 2017-01-01: qty 100

## 2017-01-01 MED ORDER — SENNA-DOCUSATE SODIUM 8.6-50 MG PO TABS: 2.0000 | ORAL_TABLET | Freq: Every day | ORAL | 1 refills | Status: DC

## 2017-01-01 MED ORDER — OXYCODONE HCL 5 MG PO TABS
ORAL_TABLET | ORAL | Status: AC
  Filled 2017-01-01: qty 1

## 2017-01-01 MED ORDER — DEXAMETHASONE SODIUM PHOSPHATE 4 MG/ML IJ SOLN
INTRAMUSCULAR | Status: DC | PRN
  Administered 2017-01-01: 10 mg via INTRAVENOUS

## 2017-01-01 MED ORDER — LIDOCAINE 2% (20 MG/ML) 5 ML SYRINGE
INTRAMUSCULAR | Status: DC | PRN
  Administered 2017-01-01: 40 mg via INTRAVENOUS

## 2017-01-01 MED ORDER — KETOROLAC TROMETHAMINE 30 MG/ML IJ SOLN: 30.0000 mg | Freq: Once | INTRAMUSCULAR | Status: DC | PRN

## 2017-01-01 MED ORDER — MIDAZOLAM HCL 2 MG/2ML IJ SOLN
INTRAMUSCULAR | Status: AC
  Filled 2017-01-01: qty 2

## 2017-01-01 MED ORDER — DEXAMETHASONE SODIUM PHOSPHATE 10 MG/ML IJ SOLN
INTRAMUSCULAR | Status: AC
  Filled 2017-01-01: qty 1

## 2017-01-01 MED ORDER — BUPIVACAINE HCL (PF) 0.5 % IJ SOLN
INTRAMUSCULAR | Status: DC | PRN
  Administered 2017-01-01: 20 mL

## 2017-01-01 MED ORDER — KETOROLAC TROMETHAMINE 30 MG/ML IJ SOLN
INTRAMUSCULAR | Status: DC | PRN
  Administered 2017-01-01: 30 mg via INTRAVENOUS

## 2017-01-01 MED ORDER — LACTATED RINGERS IV SOLN
INTRAVENOUS | Status: DC
  Administered 2017-01-01: 13:00:00 via INTRAVENOUS

## 2017-01-01 MED ORDER — KETOROLAC TROMETHAMINE 30 MG/ML IJ SOLN
INTRAMUSCULAR | Status: AC
  Filled 2017-01-01: qty 1

## 2017-01-01 MED ORDER — MIDAZOLAM HCL 2 MG/2ML IJ SOLN
1.0000 mg | INTRAMUSCULAR | Status: DC | PRN
  Administered 2017-01-01: 2 mg via INTRAVENOUS

## 2017-01-01 MED ORDER — ONDANSETRON HCL 4 MG/2ML IJ SOLN
INTRAMUSCULAR | Status: AC
  Filled 2017-01-01: qty 2

## 2017-01-01 MED ORDER — ACETAMINOPHEN 325 MG PO TABS: 325.0000 mg | ORAL_TABLET | ORAL | Status: DC | PRN

## 2017-01-01 MED ORDER — OXYCODONE HCL 5 MG/5ML PO SOLN: 5.0000 mg | Freq: Once | ORAL | Status: AC | PRN

## 2017-01-01 MED ORDER — ONDANSETRON HCL 4 MG/2ML IJ SOLN: 4.0000 mg | Freq: Once | INTRAMUSCULAR | Status: DC | PRN

## 2017-01-01 MED ORDER — FENTANYL CITRATE (PF) 100 MCG/2ML IJ SOLN
INTRAMUSCULAR | Status: AC
  Filled 2017-01-01: qty 2

## 2017-01-01 MED ORDER — FENTANYL CITRATE (PF) 100 MCG/2ML IJ SOLN
50.0000 ug | INTRAMUSCULAR | Status: DC | PRN
  Administered 2017-01-01: 100 ug via INTRAVENOUS

## 2017-01-01 MED ORDER — OXYCODONE HCL 5 MG PO TABS
5.0000 mg | ORAL_TABLET | Freq: Once | ORAL | Status: AC | PRN
Start: 2017-01-01 — End: 2017-01-01
  Administered 2017-01-01: 5 mg via ORAL

## 2017-01-01 MED ORDER — SCOPOLAMINE 1 MG/3DAYS TD PT72: 1.0000 | MEDICATED_PATCH | Freq: Once | TRANSDERMAL | Status: DC | PRN

## 2017-01-01 MED FILL — ONDANSETRON HCL 4 MG TABLET: 4 | 3 days supply | Qty: 10 | Fill #0

## 2017-01-01 MED FILL — HYDROCODON-APAP 10-325: 10-325 | 3 days supply | Qty: 20 | Fill #0

## 2017-01-01 SURGICAL SUPPLY — 42 items
BANDAGE ACE 6X5 VEL STRL LF (GAUZE/BANDAGES/DRESSINGS) ×3 IMPLANT
BANDAGE ESMARK 6X9 LF (GAUZE/BANDAGES/DRESSINGS) IMPLANT
BLADE CUTTER GATOR 3.5 (BLADE) ×3 IMPLANT
BNDG CMPR 9X6 STRL LF SNTH (GAUZE/BANDAGES/DRESSINGS)
BNDG ESMARK 6X9 LF (GAUZE/BANDAGES/DRESSINGS)
CLOSURE STERI-STRIP 1/2X4 (GAUZE/BANDAGES/DRESSINGS) ×1
CLSR STERI-STRIP ANTIMIC 1/2X4 (GAUZE/BANDAGES/DRESSINGS) ×2 IMPLANT
CUFF TOURNIQUET SINGLE 34IN LL (TOURNIQUET CUFF) IMPLANT
CUTTER KNOT PUSHER 2-0 FIBERWI (INSTRUMENTS) IMPLANT
CUTTER MENISCUS  4.2MM (BLADE)
CUTTER MENISCUS 4.2MM (BLADE) IMPLANT
DRAPE ARTHROSCOPY W/POUCH 90 (DRAPES) ×3 IMPLANT
DRAPE IMP U-DRAPE 54X76 (DRAPES) ×3 IMPLANT
DRAPE U-SHAPE 47X51 STRL (DRAPES) ×2 IMPLANT
DURAPREP 26ML APPLICATOR (WOUND CARE) ×3 IMPLANT
ELECT MENISCUS 165MM 90D (ELECTRODE) IMPLANT
ELECT REM PT RETURN 9FT ADLT (ELECTROSURGICAL) ×3
ELECTRODE REM PT RTRN 9FT ADLT (ELECTROSURGICAL) IMPLANT
GAUZE SPONGE 4X4 12PLY STRL (GAUZE/BANDAGES/DRESSINGS) ×3 IMPLANT
GLOVE BIO SURGEON STRL SZ 6.5 (GLOVE) ×1 IMPLANT
GLOVE BIO SURGEON STRL SZ8 (GLOVE) ×3 IMPLANT
GLOVE BIO SURGEONS STRL SZ 6.5 (GLOVE) ×1
GLOVE BIOGEL PI IND STRL 8 (GLOVE) ×2 IMPLANT
GLOVE BIOGEL PI INDICATOR 8 (GLOVE) ×4
GLOVE EXAM NITRILE MD LF STRL (GLOVE) ×2 IMPLANT
GLOVE ORTHO TXT STRL SZ7.5 (GLOVE) ×3 IMPLANT
GOWN STRL REUS W/ TWL LRG LVL3 (GOWN DISPOSABLE) ×1 IMPLANT
GOWN STRL REUS W/ TWL XL LVL3 (GOWN DISPOSABLE) ×2 IMPLANT
GOWN STRL REUS W/TWL LRG LVL3 (GOWN DISPOSABLE) ×3
GOWN STRL REUS W/TWL XL LVL3 (GOWN DISPOSABLE) ×6
KNEE WRAP E Z 3 GEL PACK (MISCELLANEOUS) ×2 IMPLANT
MANIFOLD NEPTUNE II (INSTRUMENTS) ×3 IMPLANT
PACK ARTHROSCOPY DSU (CUSTOM PROCEDURE TRAY) ×3 IMPLANT
PACK BASIN DAY SURGERY FS (CUSTOM PROCEDURE TRAY) ×3 IMPLANT
PENCIL BUTTON HOLSTER BLD 10FT (ELECTRODE) IMPLANT
PROBE BIPOLAR ATHRO 135MM 90D (MISCELLANEOUS) IMPLANT
SET ARTHROSCOPY TUBING (MISCELLANEOUS) ×3
SET ARTHROSCOPY TUBING LN (MISCELLANEOUS) ×1 IMPLANT
SLEEVE SCD COMPRESS KNEE MED (MISCELLANEOUS) ×2 IMPLANT
SUT MNCRL AB 4-0 PS2 18 (SUTURE) ×3 IMPLANT
TOWEL OR 17X24 6PK STRL BLUE (TOWEL DISPOSABLE) ×3 IMPLANT
WATER STERILE IRR 1000ML POUR (IV SOLUTION) ×3 IMPLANT

## 2017-01-01 NOTE — Op Note (Signed)
01/01/2017  2:39 PM  PATIENT:  [name]    PRE-OPERATIVE DIAGNOSIS: Complex tear right medial meniscus, with early chondromalacia, anterior horn lateral meniscus tear, patellar chondromalacia  POST-OPERATIVE DIAGNOSIS:  Same  PROCEDURE: Right knee arthroscopy with partial medial meniscectomy, chondroplasty of the medial femoral condyle, undersurface of the patella, with partial meniscectomy of frayed anterior horn portions of the lateral meniscus.  SURGEON:  Johnny Bridge, MD  PHYSICIAN ASSISTANT: Joya Gaskins, OPA-C, present and scrubbed throughout the case, critical for completion in a timely fashion, and for retraction, instrumentation, and closure.  ANESTHESIA:   General  PREOPERATIVE INDICATIONS:  Ann Mendez is a  56 y.o. female who had a complex tear of the medial meniscus as well as other intra-articular pathology with recurrent effusion and pain elected for surgical management.  The risks benefits and alternatives were discussed with the patient preoperatively including but not limited to the risks of infection, bleeding, nerve injury, cardiopulmonary complications, the need for revision surgery, among others, and the patient was willing to proceed.  ESTIMATED BLOOD LOSS: Minimal  OPERATIVE IMPLANTS: None  OPERATIVE FINDINGS: The medial meniscus had a complex tear from the body that extended around radially to the level of the capsule and then involve the posterior horn of the medial meniscus.  The anterior aspect of the medial meniscus was intact.  The tibial plateau was still in reasonably good condition although posteriorly it looks like there was some grade 3 wear, and there was also some stents of grade 2 and may be even grade 3 wear on the medial femur.  There was no exposed bone.  The patella tracked well, and there was some undersurface patellar cracking, but no full-thickness chondral loss.  The lateral compartment had intact cartilage, although it was  fairly difficult to access the lateral compartment, there is a little wear on the tibial plateau, but the femur was completely intact, there was an anterior horn fraying which was debrided.  OPERATIVE PROCEDURE: The patient was brought to the operating room placed in supine position.  General anesthesia was administered.  The right lower extremity was prepped and draped in the usual sterile fashion.  Timeout performed.  Diagnostic arthroscopy was carried out with the above named findings and the arthroscopic shaver was used to debride the meniscus back to a stable configuration, I used the basket also to trim the edges.  I used the shaver to debride the chondral surface of the femur as well.  The lateral meniscus was debrided at the anterior horn, back to a stable configuration.  The ACL and PCL are intact.  After complete debridement was carried out the arthroscopic instruments were removed and the portals closed with Monocryl followed by Steri-Strips and sterile gauze.  She was awakened and the knee was injected and she returned to the PACU in stable and satisfactory condition.  There were no complications and she tolerated the procedure well.  If her pain is incompletely relieved, and she progresses from a degenerative standpoint, she may be a candidate for partial knee replacement in the future, although right now there was no exposed bone, but the patellofemoral joint and the lateral compartment were not bad enough to preclude unicompartmental knee replacement.

## 2017-01-01 NOTE — Transfer of Care (Signed)
Immediate Anesthesia Transfer of Care Note  Patient: Jan Fireman  Procedure(s) Performed: RIGHT KNEE ARTHROSCOPY, CHONDROPLASTY AND MEDIAL LATERAL MENISCECTOMY (Right Knee)  Patient Location: PACU  Anesthesia Type:General  Level of Consciousness: awake and sedated  Airway & Oxygen Therapy: Patient Spontanous Breathing and Patient connected to face mask oxygen  Post-op Assessment: Report given to RN and Post -op Vital signs reviewed and stable  Post vital signs: Reviewed and stable  Last Vitals:  Vitals:   01/01/17 1153  BP: 130/68  Pulse: 75  Resp: 16  Temp: 36.7 C  SpO2: 100%    Last Pain:  Vitals:   01/01/17 1153  TempSrc: Oral  PainSc: 0-No pain      Patients Stated Pain Goal: 3 (89/21/19 4174)  Complications: No apparent anesthesia complications

## 2017-01-01 NOTE — Anesthesia Preprocedure Evaluation (Signed)
Anesthesia Evaluation  Patient identified by MRN, date of birth, ID band Patient awake    Reviewed: Allergy & Precautions, NPO status , Patient's Chart, lab work & pertinent test results  Airway Mallampati: I  TM Distance: >3 FB Neck ROM: Full    Dental no notable dental hx.    Pulmonary neg pulmonary ROS,    Pulmonary exam normal breath sounds clear to auscultation       Cardiovascular negative cardio ROS Normal cardiovascular exam Rhythm:Regular Rate:Normal     Neuro/Psych negative neurological ROS  negative psych ROS   GI/Hepatic negative GI ROS, Neg liver ROS,   Endo/Other  negative endocrine ROS  Renal/GU negative Renal ROS  negative genitourinary   Musculoskeletal negative musculoskeletal ROS (+) Arthritis , Osteoarthritis,    Abdominal   Peds negative pediatric ROS (+)  Hematology negative hematology ROS (+)   Anesthesia Other Findings   Reproductive/Obstetrics negative OB ROS                             Anesthesia Physical Anesthesia Plan  ASA: II  Anesthesia Plan: General   Post-op Pain Management:    Induction: Intravenous  PONV Risk Score and Plan: 2 and Ondansetron, Dexamethasone, Treatment may vary due to age or medical condition and Midazolam  Airway Management Planned: LMA  Additional Equipment:   Intra-op Plan:   Post-operative Plan:   Informed Consent: I have reviewed the patients History and Physical, chart, labs and discussed the procedure including the risks, benefits and alternatives for the proposed anesthesia with the patient or authorized representative who has indicated his/her understanding and acceptance.     Plan Discussed with: CRNA, Surgeon and Anesthesiologist  Anesthesia Plan Comments: ( )        Anesthesia Quick Evaluation

## 2017-01-01 NOTE — H&P (Signed)
PREOPERATIVE H&P  Chief Complaint: RIGHT MEDIAL LATERAL MENISCUS TEAR,CHONDROMALACIA PATELLA  HPI: Ann Mendez is a 56 y.o. female who presents for preoperative history and physical with a diagnosis of RIGHT MEDIAL LATERAL MENISCUS TEAR,CHONDROMALACIA PATELLA. Symptoms are rated as moderate to severe, and have been worsening.  This is significantly impairing activities of daily living.  She has elected for surgical management.   Pain can get up to be 9/10 depending on activities, she did get some radiating pain down the leg that may be associated with her back, but also has some mechanical symptoms with catching in the knee.    Past Medical History:  Diagnosis Date  . Anxiety   . Arthritis    left great toe  . 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 Depression   . Lateral meniscus tear 12/2016   right medial   Past Surgical History:  Procedure Laterality Date  . CATARACT EXTRACTION W/ INTRAOCULAR LENS IMPLANT Right 03/2015  . CESAREAN SECTION  09/23/2000  . TUBAL LIGATION  09/23/2000   Social History   Socioeconomic History  . Marital status: Married    Spouse name: None  . Number of children: None  . Years of education: None  . Highest education level: None  Social Needs  . Financial resource strain: None  . Food insecurity - worry: None  . Food insecurity - inability: None  . Transportation needs - medical: None  . Transportation needs - non-medical: None  Occupational History  . None  Tobacco Use  . Smoking status: Never Smoker  . Smokeless tobacco: Never Used  Substance and Sexual Activity  . Alcohol use: Yes    Comment: 1 beer on the weekend  . Drug use: No  . Sexual activity: Yes    Birth control/protection: Surgical, Post-menopausal  Other Topics Concern  . None  Social History Narrative  . None   Family History  Problem Relation Age of Onset  . Heart disease Father   . Hypertension Father   . Cancer Father        Skin  . Heart disease  Brother   . Hypertension Brother   . Cancer Brother        Skin  . Alcohol abuse Brother   . Arthritis Mother   . Bipolar disorder Mother   . Anxiety disorder Mother   . Depression Mother   . Heart failure Mother   . Cancer Mother        Colon,Lung,Kidney  . Depression Sister   . Bipolar disorder Maternal Grandmother   . Alcohol abuse Maternal Grandmother   . Depression Maternal Grandmother   . Diabetes Maternal Grandfather    Allergies  Allergen Reactions  . Sulfa Antibiotics Other (See Comments)    UNKNOWN   Prior to Admission medications   Medication Sig Start Date End Date Taking? Authorizing Provider  ARIPiprazole (ABILIFY PO) Take 2.5 mg by mouth at bedtime.    Yes [provider]  b complex vitamins tablet Take 1 tablet by mouth daily.   Yes [provider]  buPROPion (WELLBUTRIN XL) 300 MG 24 hr tablet Take 300 mg by mouth daily.    Yes [provider]  cholecalciferol (VITAMIN D) 1000 UNITS tablet Take 5,000 Units by mouth daily.    Yes [provider]  gabapentin (NEURONTIN) 800 MG tablet Take 400 mg by mouth 2 (two) times daily.    Yes [provider]  methylphenidate 54 MG PO CR tablet Take 54 mg  by mouth every morning.   Yes [provider]  sertraline (ZOLOFT) 100 MG tablet Take 200 mg by mouth daily.    Yes [provider]  Calcium Carbonate (CALCIUM 600 PO) Take by mouth.    [provider]     Positive ROS: All other systems have been reviewed and were otherwise negative with the exception of those mentioned in the HPI and as above.  Physical Exam: General: Alert, no acute distress Cardiovascular: No pedal edema Respiratory: No cyanosis, no use of accessory musculature GI: No organomegaly, abdomen is soft and non-tender Skin: No lesions in the area of chief complaint Neurologic: Sensation intact distally Psychiatric: Patient is competent for consent with normal mood and  affect Lymphatic: No axillary or cervical lymphadenopathy  MUSCULOSKELETAL: Right knee has range of motion 0-110 degrees with intact ligamentous stability, positive effusion, positive pain to palpation medially more so than laterally.  Assessment: Right knee medial meniscus tear, question anterior horn lateral meniscus tear, possible chondromalacia patella, early degenerative changes     Plan: Plan for Procedure(s): ARTHROSCOPY KNEE CHONDROPLASTY MEDIAL meniscectomy, possible lateral meniscectomy depending on intraoperative findings  The risks benefits and alternatives were discussed with the patient including but not limited to the risks of nonoperative treatment, versus surgical intervention including infection, bleeding, nerve injury,  blood clots, cardiopulmonary complications, morbidity, mortality, among others, and they were willing to proceed.   Johnny Bridge, MD Cell [phone]   01/01/2017 1:40 PM

## 2017-01-01 NOTE — Anesthesia Postprocedure Evaluation (Signed)
Anesthesia Post Note  Patient: Ann Mendez  Procedure(s) Performed: RIGHT KNEE ARTHROSCOPY, CHONDROPLASTY AND PARTIAL MEDIAL LATERAL MENISCECTOMY (Right Knee)     Patient location during evaluation: PACU Anesthesia Type: General Level of consciousness: awake and alert Pain management: pain level controlled Vital Signs Assessment: post-procedure vital signs reviewed and stable Respiratory status: spontaneous breathing, nonlabored ventilation, respiratory function stable and patient connected to nasal cannula oxygen Cardiovascular status: blood pressure returned to baseline and stable Postop Assessment: no apparent nausea or vomiting Anesthetic complications: no    Last Vitals:  Vitals:   01/01/17 1542 01/01/17 1614  BP: (!) 141/93 (!) 168/72  Pulse: 78 75  Resp: 10 18  Temp:  36.6 C  SpO2: 99% 100%    Last Pain:  Vitals:   01/01/17 1614  TempSrc:   PainSc: 4                  Pierrette Scheu

## 2017-01-01 NOTE — Discharge Instructions (Signed)
Diet: As you were doing prior to hospitalization  ° °Shower:  May shower but keep the wounds dry, use an occlusive plastic wrap, NO SOAKING IN TUB.  If the bandage gets wet, change with a clean dry gauze.  If you have a splint on, leave the splint in place and keep the splint dry with a plastic bag. ° °Dressing:  You may change your dressing 3-5 days after surgery, unless you have a splint.  If you have a splint, then just leave the splint in place and we will change your bandages during your first follow-up appointment.   ° °If you had hand or foot surgery, we will plan to remove your stitches in about 2 weeks in the office.  For all other surgeries, there are sticky tapes (steri-strips) on your wounds and all the stitches are absorbable.  Leave the steri-strips in place when changing your dressings, they will peel off with time, usually 2-3 weeks. ° °Activity:  Increase activity slowly as tolerated, but follow the weight bearing instructions below.  The rules on driving is that you can not be taking narcotics while you drive, and you must feel in control of the vehicle.   ° °Weight Bearing:   As tolerated.   ° °To prevent constipation: you may use a stool softener such as - ° °Colace (over the counter) 100 mg by mouth twice a day  °Drink plenty of fluids (prune juice may be helpful) and high fiber foods °Miralax (over the counter) for constipation as needed.   ° °Itching:  If you experience itching with your medications, try taking only a single pain pill, or even half a pain pill at a time.  You may take up to 10 pain pills per day, and you can also use benadryl over the counter for itching or also to help with sleep.  ° °Precautions:  If you experience chest pain or shortness of breath - call 911 immediately for transfer to the hospital emergency department!! ° °If you develop a fever greater that 101 F, purulent drainage from wound, increased redness or drainage from wound, or calf pain -- Call the office at  [phone]                                                °Follow- Up Appointment:  Please call for an appointment to be seen in 2 weeks [place] - [phone] ° ° °Post Anesthesia Home Care Instructions ° °Activity: °Get plenty of rest for the remainder of the day. A responsible individual must stay with you for 24 hours following the procedure.  °For the next 24 hours, DO NOT: °-Drive a car °-Operate machinery °-Drink alcoholic beverages °-Take any medication unless instructed by your physician °-Make any legal decisions or sign important papers. ° °Meals: °Start with liquid foods such as gelatin or soup. Progress to regular foods as tolerated. Avoid greasy, spicy, heavy foods. If nausea and/or vomiting occur, drink only clear liquids until the nausea and/or vomiting subsides. Call your physician if vomiting continues. ° °Special Instructions/Symptoms: °Your throat may feel dry or sore from the anesthesia or the breathing tube placed in your throat during surgery. If this causes discomfort, gargle with warm salt water. The discomfort should disappear within 24 hours. ° °If you had a scopolamine patch placed behind your ear for the management of post- operative   nausea and/or vomiting: ° °1. The medication in the patch is effective for 72 hours, after which it should be removed.  Wrap patch in a tissue and discard in the trash. Wash hands thoroughly with soap and water. °2. You may remove the patch earlier than 72 hours if you experience unpleasant side effects which may include dry mouth, dizziness or visual disturbances. °3. Avoid touching the patch. Wash your hands with soap and water after contact with the patch. °   ° ° ° ° ° °

## 2017-01-01 NOTE — Anesthesia Procedure Notes (Signed)
Procedure Name: LMA Insertion Performed by: Anuj Summons W, CRNA Pre-anesthesia Checklist: Patient identified, Emergency Drugs available, Suction available and Patient being monitored Patient Re-evaluated:Patient Re-evaluated prior to induction Oxygen Delivery Method: Circle system utilized Preoxygenation: Pre-oxygenation with 100% oxygen Induction Type: IV induction Ventilation: Mask ventilation without difficulty LMA: LMA inserted LMA Size: 4.0 Number of attempts: 1 Placement Confirmation: positive ETCO2 Tube secured with: Tape Dental Injury: Teeth and Oropharynx as per pre-operative assessment        

## 2017-01-02 ENCOUNTER — Encounter (HOSPITAL_BASED_OUTPATIENT_CLINIC_OR_DEPARTMENT_OTHER): Payer: Self-pay | Admitting: Orthopedic Surgery

## 2017-01-07 MED FILL — MELOXICAM 15 MG TABLET: 15 | 30 days supply | Qty: 30 | Fill #0

## 2017-01-08 MED FILL — PREMARIN VAGINAL CREAM-APPL: 0.625 | 30 days supply | Qty: 30 | Fill #2

## 2017-01-16 DIAGNOSIS — S83281D Other tear of lateral meniscus, current injury, right knee, subsequent encounter: Secondary | ICD-10-CM | POA: Diagnosis not present

## 2017-01-16 DIAGNOSIS — S83241D Other tear of medial meniscus, current injury, right knee, subsequent encounter: Secondary | ICD-10-CM | POA: Diagnosis not present

## 2017-01-19 DIAGNOSIS — F332 Major depressive disorder, recurrent severe without psychotic features: Secondary | ICD-10-CM | POA: Diagnosis not present

## 2017-01-19 DIAGNOSIS — F411 Generalized anxiety disorder: Secondary | ICD-10-CM | POA: Diagnosis not present

## 2017-01-23 MED FILL — CONCERTA 54 MG TABLET ER: 54 | 30 days supply | Qty: 30 | Fill #0

## 2017-01-30 MED FILL — GABAPENTIN 400 MG CAPSULE: 400 | 90 days supply | Qty: 180 | Fill #0

## 2017-02-04 DIAGNOSIS — H524 Presbyopia: Secondary | ICD-10-CM | POA: Diagnosis not present

## 2017-02-06 MED FILL — ARIPiprazole 5 MG TABS: 5 | 90 days supply | Qty: 45 | Fill #0

## 2017-02-06 MED FILL — BUPROPION HCL XL 300 MG TAB: 300 | 90 days supply | Qty: 90 | Fill #0

## 2017-02-06 MED FILL — SERTRALINE HCL 100 MG TAB: 100 | 90 days supply | Qty: 180 | Fill #0

## 2017-02-12 MED FILL — ACYCLOVIR 200 MG CAPSULE: 200 | 8 days supply | Qty: 30 | Fill #1

## 2017-02-17 DIAGNOSIS — F411 Generalized anxiety disorder: Secondary | ICD-10-CM | POA: Diagnosis not present

## 2017-02-17 DIAGNOSIS — F332 Major depressive disorder, recurrent severe without psychotic features: Secondary | ICD-10-CM | POA: Diagnosis not present

## 2017-02-27 MED FILL — CONCERTA 54 MG TABLET ER: 54 | 30 days supply | Qty: 30 | Fill #0

## 2017-03-10 DIAGNOSIS — F332 Major depressive disorder, recurrent severe without psychotic features: Secondary | ICD-10-CM | POA: Diagnosis not present

## 2017-03-10 DIAGNOSIS — F411 Generalized anxiety disorder: Secondary | ICD-10-CM | POA: Diagnosis not present

## 2017-03-18 ENCOUNTER — Encounter: Payer: Self-pay | Admitting: Gynecology

## 2017-03-18 DIAGNOSIS — Z1231 Encounter for screening mammogram for malignant neoplasm of breast: Secondary | ICD-10-CM | POA: Diagnosis not present

## 2017-03-20 MED FILL — PREMARIN VAGINAL CREAM-APPL: 0.625 | 30 days supply | Qty: 30 | Fill #3

## 2017-03-20 MED FILL — SODIUM CHLORIDE 0.9% INHAL: 0.9 | 30 days supply | Qty: 90 | Fill #0

## 2017-03-25 MED FILL — ARIPiprazole 5 MG TABS: 5 | 90 days supply | Qty: 90 | Fill #0

## 2017-03-27 MED FILL — CONCERTA 54 MG TABLET ER: 54 | 30 days supply | Qty: 30 | Fill #0

## 2017-04-02 DIAGNOSIS — F332 Major depressive disorder, recurrent severe without psychotic features: Secondary | ICD-10-CM | POA: Diagnosis not present

## 2017-04-15 DIAGNOSIS — S83241D Other tear of medial meniscus, current injury, right knee, subsequent encounter: Secondary | ICD-10-CM | POA: Diagnosis not present

## 2017-04-15 MED FILL — MELOXICAM 15 MG TABLET: 15 | 30 days supply | Qty: 30 | Fill #0

## 2017-04-28 MED FILL — GABAPENTIN 400 MG CAPSULE: 400 | 90 days supply | Qty: 180 | Fill #1

## 2017-04-29 MED FILL — CONCERTA 54 MG TABLET ER: 54 | 30 days supply | Qty: 30 | Fill #0

## 2017-05-07 MED FILL — SERTRALINE HCL 100 MG TAB: 100 | 90 days supply | Qty: 180 | Fill #0

## 2017-05-07 MED FILL — BUPROPION HCL XL 300 MG TAB: 300 | 90 days supply | Qty: 90 | Fill #0

## 2017-05-14 DIAGNOSIS — F332 Major depressive disorder, recurrent severe without psychotic features: Secondary | ICD-10-CM | POA: Diagnosis not present

## 2017-05-29 MED FILL — CONCERTA 54 MG TABLET ER: 54 | 30 days supply | Qty: 30 | Fill #0

## 2017-05-29 MED FILL — PREMARIN VAGINAL CREAM-APPL: 0.625 | 30 days supply | Qty: 30 | Fill #0

## 2017-06-03 DIAGNOSIS — M5442 Lumbago with sciatica, left side: Secondary | ICD-10-CM | POA: Diagnosis not present

## 2017-06-03 DIAGNOSIS — M79605 Pain in left leg: Secondary | ICD-10-CM | POA: Diagnosis not present

## 2017-06-03 DIAGNOSIS — M5441 Lumbago with sciatica, right side: Secondary | ICD-10-CM | POA: Diagnosis not present

## 2017-06-03 DIAGNOSIS — M7918 Myalgia, other site: Secondary | ICD-10-CM | POA: Diagnosis not present

## 2017-06-03 DIAGNOSIS — M79604 Pain in right leg: Secondary | ICD-10-CM | POA: Diagnosis not present

## 2017-06-12 DIAGNOSIS — R1313 Dysphagia, pharyngeal phase: Secondary | ICD-10-CM | POA: Diagnosis not present

## 2017-06-12 DIAGNOSIS — J029 Acute pharyngitis, unspecified: Secondary | ICD-10-CM | POA: Diagnosis not present

## 2017-06-15 DIAGNOSIS — F418 Other specified anxiety disorders: Secondary | ICD-10-CM | POA: Diagnosis not present

## 2017-06-15 DIAGNOSIS — M791 Myalgia, unspecified site: Secondary | ICD-10-CM | POA: Diagnosis not present

## 2017-06-15 DIAGNOSIS — M79604 Pain in right leg: Secondary | ICD-10-CM | POA: Diagnosis not present

## 2017-06-15 DIAGNOSIS — M79605 Pain in left leg: Secondary | ICD-10-CM | POA: Diagnosis not present

## 2017-06-15 DIAGNOSIS — Z8759 Personal history of other complications of pregnancy, childbirth and the puerperium: Secondary | ICD-10-CM | POA: Diagnosis not present

## 2017-06-15 DIAGNOSIS — N952 Postmenopausal atrophic vaginitis: Secondary | ICD-10-CM | POA: Diagnosis not present

## 2017-06-15 DIAGNOSIS — Z1211 Encounter for screening for malignant neoplasm of colon: Secondary | ICD-10-CM | POA: Diagnosis not present

## 2017-06-15 DIAGNOSIS — Z0000 Encounter for general adult medical examination without abnormal findings: Secondary | ICD-10-CM | POA: Diagnosis not present

## 2017-06-15 DIAGNOSIS — E7800 Pure hypercholesterolemia, unspecified: Secondary | ICD-10-CM | POA: Diagnosis not present

## 2017-06-15 DIAGNOSIS — E559 Vitamin D deficiency, unspecified: Secondary | ICD-10-CM | POA: Diagnosis not present

## 2017-06-26 MED FILL — ARIPiprazole 5 MG TABS: 5 | 90 days supply | Qty: 90 | Fill #0

## 2017-06-26 MED FILL — CONCERTA 54 MG TABLET ER: 54 | 30 days supply | Qty: 30 | Fill #0

## 2017-07-07 DIAGNOSIS — X32XXXD Exposure to sunlight, subsequent encounter: Secondary | ICD-10-CM | POA: Diagnosis not present

## 2017-07-07 DIAGNOSIS — L570 Actinic keratosis: Secondary | ICD-10-CM | POA: Diagnosis not present

## 2017-07-10 DIAGNOSIS — F332 Major depressive disorder, recurrent severe without psychotic features: Secondary | ICD-10-CM | POA: Diagnosis not present

## 2017-07-28 MED FILL — CONCERTA 54 MG TABLET ER: 54 | 30 days supply | Qty: 30 | Fill #0

## 2017-07-28 MED FILL — GABAPENTIN 400 MG CAPSULE: 400 | 30 days supply | Qty: 60 | Fill #3

## 2017-07-29 DIAGNOSIS — G5701 Lesion of sciatic nerve, right lower limb: Secondary | ICD-10-CM | POA: Diagnosis not present

## 2017-07-29 DIAGNOSIS — G5702 Lesion of sciatic nerve, left lower limb: Secondary | ICD-10-CM | POA: Diagnosis not present

## 2017-07-29 DIAGNOSIS — Z6821 Body mass index (BMI) 21.0-21.9, adult: Secondary | ICD-10-CM | POA: Diagnosis not present

## 2017-07-29 DIAGNOSIS — M79605 Pain in left leg: Secondary | ICD-10-CM | POA: Diagnosis not present

## 2017-07-29 DIAGNOSIS — M79604 Pain in right leg: Secondary | ICD-10-CM | POA: Diagnosis not present

## 2017-07-29 DIAGNOSIS — M150 Primary generalized (osteo)arthritis: Secondary | ICD-10-CM | POA: Diagnosis not present

## 2017-07-29 MED FILL — DICLOFENAC SODIUM 75 MG TAB: 75 | 30 days supply | Qty: 60 | Fill #0

## 2017-08-03 MED FILL — SERTRALINE HCL 100 MG TAB: 100 | 90 days supply | Qty: 180 | Fill #0

## 2017-08-03 MED FILL — BUPROPION HCL XL 300 MG TAB: 300 | 90 days supply | Qty: 90 | Fill #0

## 2017-08-26 MED FILL — SODIUM CHLORIDE 0.9% INHAL: 0.9 | 30 days supply | Qty: 90 | Fill #1

## 2017-08-27 MED FILL — CONCERTA 54 MG TABLET ER: 54 | 30 days supply | Qty: 30 | Fill #0

## 2017-08-27 MED FILL — PREMARIN VAGINAL CREAM-APPL: 0.625 | 30 days supply | Qty: 30 | Fill #1

## 2017-08-28 MED FILL — GABAPENTIN 400 MG CAPSULE: 400 | 90 days supply | Qty: 180 | Fill #0

## 2017-08-28 MED FILL — DICLOFENAC SODIUM 75 MG TAB: 75 | 30 days supply | Qty: 60 | Fill #1

## 2017-09-01 DIAGNOSIS — F332 Major depressive disorder, recurrent severe without psychotic features: Secondary | ICD-10-CM | POA: Diagnosis not present

## 2017-09-24 MED FILL — ARIPiprazole 5 MG TABS: 5 | 90 days supply | Qty: 90 | Fill #1

## 2017-09-28 MED FILL — CONCERTA 54 MG TABLET ER: 54 | 30 days supply | Qty: 30 | Fill #0

## 2017-10-01 MED FILL — DICLOFENAC SODIUM 75 MG TAB: 75 | 30 days supply | Qty: 60 | Fill #0

## 2017-10-26 MED FILL — DICLOFENAC SODIUM 1% GEL: 1 | 25 days supply | Qty: 100 | Fill #0

## 2017-10-28 MED FILL — CONCERTA 54 MG TABLET ER: 54 | 30 days supply | Qty: 30 | Fill #0

## 2017-11-03 MED FILL — SERTRALINE HCL 100 MG TAB: 100 | 90 days supply | Qty: 180 | Fill #0

## 2017-11-09 ENCOUNTER — Ambulatory Visit (INDEPENDENT_AMBULATORY_CARE_PROVIDER_SITE_OTHER): Payer: 59 | Admitting: Gynecology

## 2017-11-09 ENCOUNTER — Encounter: Payer: Self-pay | Admitting: Gynecology

## 2017-11-09 VITALS — BP 118/76 | Ht 62.0 in | Wt 119.0 lb

## 2017-11-09 DIAGNOSIS — Z01419 Encounter for gynecological examination (general) (routine) without abnormal findings: Secondary | ICD-10-CM

## 2017-11-09 DIAGNOSIS — Z1151 Encounter for screening for human papillomavirus (HPV): Secondary | ICD-10-CM

## 2017-11-09 DIAGNOSIS — N952 Postmenopausal atrophic vaginitis: Secondary | ICD-10-CM

## 2017-11-09 NOTE — Patient Instructions (Signed)
Follow-up in 1 year, sooner as needed. 

## 2017-11-09 NOTE — Addendum Note (Signed)
Addended by: Nelva Nay on: 11/09/2017 01:00 PM   Modules accepted: Orders

## 2017-11-09 NOTE — Progress Notes (Signed)
    [place] 1960/02/06 [id number]        57 y.o.  [id number] for annual gynecologic exam.  Doing well without gynecologic complaints.  Past medical history,surgical history, problem list, medications, allergies, family history and social history were all reviewed and documented as reviewed in the EPIC chart.  ROS:  Performed with pertinent positives and negatives included in the history, assessment and plan.   Additional significant findings : None   Exam: [name] assistant Vitals:   11/09/17 1219  BP: 118/76  Weight: 119 lb (54 kg)  Height: 5\' 2"  (1.575 m)   Body mass index is 21.77 kg/m.  General appearance:  Normal affect, orientation and appearance. Skin: Grossly normal HEENT: Without gross lesions.  No cervical or supraclavicular adenopathy. Thyroid normal.  Lungs:  Clear without wheezing, rales or rhonchi Cardiac: RR, without RMG Abdominal:  Soft, nontender, without masses, guarding, rebound, organomegaly or hernia Breasts:  Examined lying and sitting without masses, retractions, discharge or axillary adenopathy. Pelvic:  Ext, BUS, Vagina: With atrophic changes  Cervix: With atrophic changes.  Pap smear/HPV  Uterus: Anteverted, normal size, shape and contour, midline and mobile nontender   Adnexa: Without masses or tenderness    Anus and perineum: Normal   Rectovaginal: Normal sphincter tone without palpated masses or tenderness.    Assessment/Plan:  57 y.o. [id number] female for annual gynecologic exam.   1. Postmenopausal/atrophic genital changes.  Continues with Premarin vaginal cream 1.5 g twice weekly.  Is off of any HRT and doing well with this.  Receives prescription through her primary physician's office.  No bleeding.  No significant hot flushes or sweats. 2. History of recurrent HSV.  Uses acyclovir 200 mg 2 twice daily for several days when needed.  Has not had an outbreak in a long time.  Has supply at home but will call if needs more. 3. Pap  smear/HPV 2015.  Pap smear/HPV today.  No history of significant abnormal Pap smears. 4. DEXA 2012 normal.  Plan repeat DEXA next year at age 58. 75. Colonoscopy 2010.  Plan repeat next year and she is going to make those arrangements. 6. Mammography coming due in March and I reminded her to schedule this.  Breast exam normal today. 7. Health maintenance.  No routine lab work done as patient does this elsewhere.  Follow-up 1 year, sooner as needed.   Anastasio Auerbach MD, 12:49 PM 11/09/2017

## 2017-11-10 DIAGNOSIS — F332 Major depressive disorder, recurrent severe without psychotic features: Secondary | ICD-10-CM | POA: Diagnosis not present

## 2017-11-10 LAB — PAP IG AND HPV HIGH-RISK: HPV DNA HIGH RISK: NOT DETECTED

## 2017-11-10 MED FILL — GABAPENTIN 300 MG CAPSULE: 300 | 30 days supply | Qty: 60 | Fill #0

## 2017-11-11 MED FILL — BuPROPion HCL ER (XL) 300 M: 300 | 90 days supply | Qty: 90 | Fill #0

## 2017-11-26 MED FILL — CONCERTA 54 MG TABLET ER: 54 | 30 days supply | Qty: 30 | Fill #0

## 2017-12-07 MED FILL — DICLOFENAC SODIUM 75 MG TAB: 75 | 30 days supply | Qty: 60 | Fill #1

## 2017-12-07 MED FILL — SODIUM CHLORIDE 0.9% INHAL: 0.9 | 30 days supply | Qty: 90 | Fill #2

## 2017-12-17 MED FILL — GABAPENTIN 300 MG CAPSULE: 300 | 30 days supply | Qty: 60 | Fill #0

## 2017-12-23 MED FILL — ARIPiprazole 5 MG TABS: 5 | 90 days supply | Qty: 90 | Fill #0

## 2017-12-24 MED FILL — CONCERTA 54 MG TABLET ER: 54 | 30 days supply | Qty: 30 | Fill #0

## 2017-12-31 DIAGNOSIS — F332 Major depressive disorder, recurrent severe without psychotic features: Secondary | ICD-10-CM | POA: Diagnosis not present

## 2018-01-26 ENCOUNTER — Other Ambulatory Visit: Payer: Self-pay | Admitting: Gynecology

## 2018-01-26 MED FILL — CONCERTA 54 MG TABLET ER: 54 | 30 days supply | Qty: 30 | Fill #0

## 2018-01-26 MED FILL — SERTRALINE HCL 100 MG TAB: 100 | 90 days supply | Qty: 180 | Fill #1

## 2018-01-27 MED FILL — PREMARIN VAGINAL CREAM-APPL: 0.625 | 90 days supply | Qty: 30 | Fill #0

## 2018-02-04 DIAGNOSIS — F332 Major depressive disorder, recurrent severe without psychotic features: Secondary | ICD-10-CM | POA: Diagnosis not present

## 2018-02-05 DIAGNOSIS — H524 Presbyopia: Secondary | ICD-10-CM | POA: Diagnosis not present

## 2018-02-05 MED FILL — DICLOFENAC SODIUM 75 MG TAB: 75 | 30 days supply | Qty: 60 | Fill #0

## 2018-02-05 MED FILL — buPROPion HCL ER (XL) 300 M: 300 | 90 days supply | Qty: 90 | Fill #0

## 2018-02-16 ENCOUNTER — Telehealth: Payer: Self-pay

## 2018-02-16 MED ORDER — ACYCLOVIR 200 MG PO CAPS: 400.0000 mg | ORAL_CAPSULE | Freq: Two times a day (BID) | ORAL | 4 refills | Status: DC

## 2018-02-16 MED FILL — ACYCLOVIR 200 MG CAP: 200 | 15 days supply | Qty: 60 | Fill #0

## 2018-02-16 NOTE — Telephone Encounter (Signed)
Needs Rx for Acylovir as "is having issues again".  Said you all talked about it at [facility] but new Rx was not sent.  [facility] 11/09/17 "History of recurrent HSV.  Uses acyclovir 200 mg 2 twice daily for several days when needed.  Has not had an outbreak in a long time.  Has supply at home but will call if needs more."

## 2018-02-16 NOTE — Telephone Encounter (Signed)
Rx sent. Patient informed. 

## 2018-02-16 NOTE — Telephone Encounter (Signed)
Okay for refill?  

## 2018-02-24 MED FILL — GABAPENTIN 300 MG CAPSULE: 300 | 30 days supply | Qty: 60 | Fill #0

## 2018-02-24 MED FILL — CONCERTA 54 MG TABLET ER: 54 | 30 days supply | Qty: 30 | Fill #0

## 2018-03-24 ENCOUNTER — Encounter: Payer: Self-pay | Admitting: Gynecology

## 2018-03-24 DIAGNOSIS — Z1231 Encounter for screening mammogram for malignant neoplasm of breast: Secondary | ICD-10-CM | POA: Diagnosis not present

## 2018-03-24 MED FILL — CONCERTA 54 MG TABLET ER: 54 | 30 days supply | Qty: 30 | Fill #0

## 2018-03-29 DIAGNOSIS — F332 Major depressive disorder, recurrent severe without psychotic features: Secondary | ICD-10-CM | POA: Diagnosis not present

## 2018-03-29 MED FILL — GABAPENTIN 300 MG CAPSULE: 300 | 30 days supply | Qty: 60 | Fill #1

## 2018-03-29 MED FILL — ARIPiprazole 5 MG TABS: 5 | 90 days supply | Qty: 90 | Fill #1

## 2018-04-05 DIAGNOSIS — M1710 Unilateral primary osteoarthritis, unspecified knee: Secondary | ICD-10-CM | POA: Diagnosis not present

## 2018-04-06 MED FILL — SODIUM CHLORIDE 0.9 % NEBU: 0.9 | 30 days supply | Qty: 300 | Fill #0

## 2018-04-06 MED FILL — DICLOFENAC SODIUM 75 MG TAB: 75 | 90 days supply | Qty: 90 | Fill #0

## 2018-04-22 MED FILL — CONCERTA 54 MG TABLET ER: 54 | 30 days supply | Qty: 30 | Fill #0

## 2018-04-22 MED FILL — SERTRALINE HCL 100 MG TAB: 100 | 90 days supply | Qty: 180 | Fill #0

## 2018-04-28 DIAGNOSIS — F411 Generalized anxiety disorder: Secondary | ICD-10-CM | POA: Diagnosis not present

## 2018-04-28 DIAGNOSIS — F332 Major depressive disorder, recurrent severe without psychotic features: Secondary | ICD-10-CM | POA: Diagnosis not present

## 2018-05-08 MED FILL — buPROPion HCL ER (XL) 300 M: 300 | 90 days supply | Qty: 90 | Fill #0

## 2018-05-11 MED FILL — GABAPENTIN 300 MG CAPSULE: 300 | 30 days supply | Qty: 60 | Fill #0

## 2018-05-25 DIAGNOSIS — F332 Major depressive disorder, recurrent severe without psychotic features: Secondary | ICD-10-CM | POA: Diagnosis not present

## 2018-05-25 DIAGNOSIS — F411 Generalized anxiety disorder: Secondary | ICD-10-CM | POA: Diagnosis not present

## 2018-05-25 MED FILL — CONCERTA 54 MG TABLET ER: 54 | 30 days supply | Qty: 30 | Fill #0

## 2018-06-13 MED FILL — GABAPENTIN 300 MG CAPSULE: 300 | 30 days supply | Qty: 60 | Fill #1

## 2018-06-21 MED FILL — CONCERTA 54 MG TABLET ER: 54 | 30 days supply | Qty: 30 | Fill #0

## 2018-06-21 MED FILL — ARIPIPRAZOLE 5 MG TABS: 5 | 90 days supply | Qty: 90 | Fill #0

## 2018-06-30 ENCOUNTER — Encounter: Payer: Self-pay | Admitting: Gastroenterology

## 2018-07-05 MED FILL — PREMARIN VAGINAL CREAM-APPL: 0.625 | 90 days supply | Qty: 30 | Fill #0

## 2018-07-06 DIAGNOSIS — E7800 Pure hypercholesterolemia, unspecified: Secondary | ICD-10-CM | POA: Diagnosis not present

## 2018-07-06 DIAGNOSIS — E039 Hypothyroidism, unspecified: Secondary | ICD-10-CM | POA: Diagnosis not present

## 2018-07-06 DIAGNOSIS — Z1211 Encounter for screening for malignant neoplasm of colon: Secondary | ICD-10-CM | POA: Diagnosis not present

## 2018-07-06 DIAGNOSIS — F418 Other specified anxiety disorders: Secondary | ICD-10-CM | POA: Diagnosis not present

## 2018-07-06 DIAGNOSIS — E559 Vitamin D deficiency, unspecified: Secondary | ICD-10-CM | POA: Diagnosis not present

## 2018-07-06 DIAGNOSIS — Z0000 Encounter for general adult medical examination without abnormal findings: Secondary | ICD-10-CM | POA: Diagnosis not present

## 2018-07-12 MED FILL — DICLOFENAC SODIUM 75 MG TAB: 75 | 90 days supply | Qty: 90 | Fill #1

## 2018-07-12 MED FILL — GABAPENTIN 300 MG CAPSULE: 300 | 30 days supply | Qty: 60 | Fill #2

## 2018-07-14 DIAGNOSIS — F419 Anxiety disorder, unspecified: Secondary | ICD-10-CM | POA: Diagnosis not present

## 2018-07-20 DIAGNOSIS — F411 Generalized anxiety disorder: Secondary | ICD-10-CM | POA: Diagnosis not present

## 2018-07-20 DIAGNOSIS — F332 Major depressive disorder, recurrent severe without psychotic features: Secondary | ICD-10-CM | POA: Diagnosis not present

## 2018-07-20 MED FILL — GABAPENTIN 100 MG CAPSULE: 100 | 30 days supply | Qty: 120 | Fill #0

## 2018-07-20 MED FILL — CONCERTA 54 MG TABLET ER: 54 | 30 days supply | Qty: 30 | Fill #0

## 2018-07-21 DIAGNOSIS — F419 Anxiety disorder, unspecified: Secondary | ICD-10-CM | POA: Diagnosis not present

## 2018-07-21 MED FILL — ACYCLOVIR 200 MG CAP: 200 | 15 days supply | Qty: 60 | Fill #0

## 2018-07-22 DIAGNOSIS — F418 Other specified anxiety disorders: Secondary | ICD-10-CM | POA: Diagnosis not present

## 2018-07-22 DIAGNOSIS — E559 Vitamin D deficiency, unspecified: Secondary | ICD-10-CM | POA: Diagnosis not present

## 2018-07-22 DIAGNOSIS — E7800 Pure hypercholesterolemia, unspecified: Secondary | ICD-10-CM | POA: Diagnosis not present

## 2018-07-22 DIAGNOSIS — E039 Hypothyroidism, unspecified: Secondary | ICD-10-CM | POA: Diagnosis not present

## 2018-07-22 DIAGNOSIS — Z0000 Encounter for general adult medical examination without abnormal findings: Secondary | ICD-10-CM | POA: Diagnosis not present

## 2018-07-22 DIAGNOSIS — Z1211 Encounter for screening for malignant neoplasm of colon: Secondary | ICD-10-CM | POA: Diagnosis not present

## 2018-07-26 MED FILL — SERTRALINE HCL 100 MG TAB: 100 | 90 days supply | Qty: 180 | Fill #1

## 2018-08-08 MED FILL — buPROPion HCL ER (XL) 300 M: 300 | 90 days supply | Qty: 90 | Fill #1

## 2018-08-11 ENCOUNTER — Encounter: Payer: Self-pay | Admitting: Gynecology

## 2018-08-11 DIAGNOSIS — F332 Major depressive disorder, recurrent severe without psychotic features: Secondary | ICD-10-CM | POA: Diagnosis not present

## 2018-08-11 DIAGNOSIS — F4321 Adjustment disorder with depressed mood: Secondary | ICD-10-CM | POA: Diagnosis not present

## 2018-08-11 DIAGNOSIS — F411 Generalized anxiety disorder: Secondary | ICD-10-CM | POA: Diagnosis not present

## 2018-08-16 MED FILL — CONCERTA 54 MG TABLET ER: 54 | 30 days supply | Qty: 30 | Fill #0

## 2018-08-18 ENCOUNTER — Ambulatory Visit: Payer: 59 | Admitting: Gynecology

## 2018-08-18 DIAGNOSIS — F419 Anxiety disorder, unspecified: Secondary | ICD-10-CM | POA: Diagnosis not present

## 2018-08-26 ENCOUNTER — Encounter: Payer: Self-pay | Admitting: Gastroenterology

## 2018-08-26 DIAGNOSIS — F419 Anxiety disorder, unspecified: Secondary | ICD-10-CM | POA: Diagnosis not present

## 2018-08-27 MED FILL — GABAPENTIN 100 MG CAPSULE: 100 | 30 days supply | Qty: 120 | Fill #1

## 2018-09-08 DIAGNOSIS — F411 Generalized anxiety disorder: Secondary | ICD-10-CM | POA: Diagnosis not present

## 2018-09-08 DIAGNOSIS — F332 Major depressive disorder, recurrent severe without psychotic features: Secondary | ICD-10-CM | POA: Diagnosis not present

## 2018-09-11 MED FILL — CONCERTA 54 MG TABLET ER: 54 | 30 days supply | Qty: 30 | Fill #0

## 2018-09-11 MED FILL — ARIPIPRAZOLE 5 MG TABS: 5 | 90 days supply | Qty: 90 | Fill #0

## 2018-09-14 DIAGNOSIS — F419 Anxiety disorder, unspecified: Secondary | ICD-10-CM | POA: Diagnosis not present

## 2018-10-06 ENCOUNTER — Encounter: Payer: Self-pay | Admitting: Gastroenterology

## 2018-10-06 ENCOUNTER — Other Ambulatory Visit: Payer: Self-pay

## 2018-10-06 ENCOUNTER — Ambulatory Visit (AMBULATORY_SURGERY_CENTER): Payer: Self-pay | Admitting: *Deleted

## 2018-10-06 VITALS — Temp 97.6°F | Ht 62.0 in | Wt 119.2 lb

## 2018-10-06 DIAGNOSIS — Z1211 Encounter for screening for malignant neoplasm of colon: Secondary | ICD-10-CM

## 2018-10-06 DIAGNOSIS — F411 Generalized anxiety disorder: Secondary | ICD-10-CM | POA: Diagnosis not present

## 2018-10-06 DIAGNOSIS — F332 Major depressive disorder, recurrent severe without psychotic features: Secondary | ICD-10-CM | POA: Diagnosis not present

## 2018-10-06 MED ORDER — PEG 3350-KCL-NA BICARB-NACL 420 G PO SOLR: ORAL | 0 refills | Status: DC

## 2018-10-06 MED FILL — PEG-3350 AND ELECTROLYTES S: 236 | 2 days supply | Qty: 4000 | Fill #0

## 2018-10-06 NOTE — Progress Notes (Signed)
No egg or soy allegies  No diet medications taken  No anesthesia or intubation problems per pt  Registered in [place]  Pt is aware that care partner will wait in the car during procedure; if they feel like they will be too hot to wait in the car; they may wait in the lobby.  We want them to wear a mask (we do not have any that we can provide them), practice social distancing, and we will check their temperatures when they get here.  I did remind patient that their care partner needs to stay in the parking lot the entire time. Pt will wear mask into building.

## 2018-10-11 MED FILL — CONCERTA 54 MG TABLET ER: 54 | 30 days supply | Qty: 30 | Fill #0

## 2018-10-12 ENCOUNTER — Encounter: Payer: Self-pay | Admitting: Gynecology

## 2018-10-13 ENCOUNTER — Telehealth: Payer: Self-pay | Admitting: Gastroenterology

## 2018-10-13 NOTE — Telephone Encounter (Signed)
Patient called back and answered "NO" to all screening questions. °

## 2018-10-13 NOTE — Telephone Encounter (Signed)
Do you now or have you had a fever in the last 14 days?       Do you have any respiratory symptoms of shortness of breath or cough now or in the last 14 days?       Do you have any family members or close contacts with diagnosed or suspected Covid-19 in the past 14 days?     Have you been tested for Covid-19 and found to be positive?

## 2018-10-14 ENCOUNTER — Encounter: Payer: Self-pay | Admitting: Gastroenterology

## 2018-10-14 ENCOUNTER — Other Ambulatory Visit: Payer: Self-pay

## 2018-10-14 ENCOUNTER — Ambulatory Visit (AMBULATORY_SURGERY_CENTER): Payer: 59 | Admitting: Gastroenterology

## 2018-10-14 VITALS — BP 110/52 | HR 54 | Temp 98.8°F | Resp 18 | Ht 62.0 in | Wt 119.2 lb

## 2018-10-14 DIAGNOSIS — K635 Polyp of colon: Secondary | ICD-10-CM

## 2018-10-14 DIAGNOSIS — Z800 Family history of malignant neoplasm of digestive organs: Secondary | ICD-10-CM | POA: Diagnosis not present

## 2018-10-14 DIAGNOSIS — D120 Benign neoplasm of cecum: Secondary | ICD-10-CM | POA: Diagnosis not present

## 2018-10-14 DIAGNOSIS — Z1211 Encounter for screening for malignant neoplasm of colon: Secondary | ICD-10-CM

## 2018-10-14 MED ORDER — SODIUM CHLORIDE 0.9 % IV SOLN: 500.0000 mL | INTRAVENOUS | Status: DC

## 2018-10-14 NOTE — Progress Notes (Signed)
Called to room to assist during endoscopic procedure.  Patient ID and intended procedure confirmed with present staff. Received instructions for my participation in the procedure from the performing physician.  

## 2018-10-14 NOTE — Progress Notes (Signed)
Pt's states no medical or surgical changes since previsit or office visit.  Temp taken by JB VS taken by CW 

## 2018-10-14 NOTE — Op Note (Signed)
[place] Patient Name: Ann Mendez Procedure Date: 10/14/2018 10:12 AM MRN: [id number] Endoscopist: Justice Britain , MD Age: 58 Referring MD:  Date of Birth: 1960-06-06 Gender: Female Account #: [id number] Procedure:                Colonoscopy Indications:              Screening in patient at increased risk: Colorectal                            cancer in mother 28 or older Medicines:                Monitored Anesthesia Care Procedure:                Pre-Anesthesia Assessment:                           - Prior to the procedure, a History and Physical                            was performed, and patient medications and                            allergies were reviewed. The patient's tolerance of                            previous anesthesia was also reviewed. The risks                            and benefits of the procedure and the sedation                            options and risks were discussed with the patient.                            All questions were answered, and informed consent                            was obtained. Prior Anticoagulants: The patient has                            taken no previous anticoagulant or antiplatelet                            agents. ASA Grade Assessment: II - A patient with                            mild systemic disease. After reviewing the risks                            and benefits, the patient was deemed in                            satisfactory condition to undergo the procedure.  After obtaining informed consent, the colonoscope                            was passed under direct vision. Throughout the                            procedure, the patient's blood pressure, pulse, and                            oxygen saturations were monitored continuously. The                            Colonoscope was introduced through the anus and                            advanced to the 5 cm into  the ileum. The                            colonoscopy was performed without difficulty. The                            patient tolerated the procedure. The quality of the                            bowel preparation was adequate. The terminal ileum,                            ileocecal valve, appendiceal orifice, and rectum                            were photographed. Scope In: 10:33:43 AM Scope Out: 11:02:01 AM Scope Withdrawal Time: 0 hours 22 minutes 38 seconds  Total Procedure Duration: 0 hours 28 minutes 18 seconds  Findings:                 The digital rectal exam findings include                            hemorrhoids. Pertinent negatives include no                            palpable rectal lesions.                           The terminal ileum and ileocecal valve appeared                            normal.                           A large amount of semi-liquid stool was found in                            the entire colon, interfering with visualization.  Lavage of the area was performed using copious                            amounts, resulting in clearance with adequate                            visualization.                           A 10 mm polyp was found in the cecum. The polyp was                            sessile. The polyp was removed with a cold snare.                            Resection and retrieval were complete.                           Multiple small and large-mouthed diverticula were                            found in the entire colon.                           Non-bleeding non-thrombosed internal hemorrhoids                            were found during retroflexion, during perianal                            exam and during digital exam. The hemorrhoids were                            Grade II (internal hemorrhoids that prolapse but                            reduce spontaneously).                           The exam was  otherwise without abnormality. Complications:            No immediate complications. Estimated Blood Loss:     Estimated blood loss was minimal. Impression:               - Hemorrhoids found on digital rectal exam.                           - The examined portion of the ileum was normal.                           - Stool in the entire examined colon. Copious                            lavage allowed adequate visualization.                           -  One 10 mm polyp in the cecum, removed with a cold                            snare. Resected and retrieved.                           - Diverticulosis in the entire examined colon.                           - Non-bleeding non-thrombosed internal hemorrhoids.                           - The examination was otherwise normal. Recommendation:           - The patient will be observed post-procedure,                            until all discharge criteria are met.                           - Discharge patient to home.                           - Patient has a contact number available for                            emergencies. The signs and symptoms of potential                            delayed complications were discussed with the                            patient. Return to normal activities tomorrow.                            Written discharge instructions were provided to the                            patient.                           - High fiber diet.                           - Continue present medications.                           - Await pathology results.                           - Repeat colonoscopy in 3 - 5 years for                            surveillance based on pathology results - if  adenomatous or sessile serrated then will proceed                            with 3-year follow up or otherwise due to FHx will                            consider 5-year.                           - The findings and  recommendations were discussed                            with the patient. Justice Britain, MD 10/14/2018 11:10:05 AM

## 2018-10-14 NOTE — Patient Instructions (Signed)
Thank you for allowing [place] to care for you today!  Await pathology results of removed polyps.  Recommendation will be made at that time for next colonoscopy.  Resume previous diet and medications today.  Recommend high fiber diet.  Handout provided.  Return to your normal activities tomorrow.    YOU HAD AN ENDOSCOPIC PROCEDURE TODAY AT [facility]:   Refer to the procedure report that was given to you for any specific questions about what was found during the examination.  If the procedure report does not answer your questions, please call your gastroenterologist to clarify.  If you requested that your care partner not be given the details of your procedure findings, then the procedure report has been included in a sealed envelope for you to review at your convenience later.  YOU SHOULD EXPECT: Some feelings of bloating in the abdomen. Passage of more gas than usual.  Walking can help get rid of the air that was put into your GI tract during the procedure and reduce the bloating. If you had a lower endoscopy (such as a colonoscopy or flexible sigmoidoscopy) you may notice spotting of blood in your stool or on the toilet paper. If you underwent a bowel prep for your procedure, you may not have a normal bowel movement for a few days.  Please Note:  You might notice some irritation and congestion in your nose or some drainage.  This is from the oxygen used during your procedure.  There is no need for concern and it should clear up in a day or so.  SYMPTOMS TO REPORT IMMEDIATELY:   Following lower endoscopy (colonoscopy or flexible sigmoidoscopy):  Excessive amounts of blood in the stool  Significant tenderness or worsening of abdominal pains  Swelling of the abdomen that is new, acute  Fever of 100F or higher   For urgent or emergent issues, a gastroenterologist can be reached at any hour by calling [phone].   DIET:  We do recommend a small meal at first, but then  you may proceed to your regular diet.  Drink plenty of fluids but you should avoid alcoholic beverages for 24 hours.  ACTIVITY:  You should plan to take it easy for the rest of today and you should NOT DRIVE or use heavy machinery until tomorrow (because of the sedation medicines used during the test).    FOLLOW UP: Our staff will call the number listed on your records 48-72 hours following your procedure to check on you and address any questions or concerns that you may have regarding the information given to you following your procedure. If we do not reach you, we will leave a message.  We will attempt to reach you two times.  During this call, we will ask if you have developed any symptoms of COVID 19. If you develop any symptoms (ie: fever, flu-like symptoms, shortness of breath, cough etc.) before then, please call [phone].  If you test positive for Covid 19 in the 2 weeks post procedure, please call and report this information to [place].    If any biopsies were taken you will be contacted by phone or by letter within the next 1-3 weeks.  Please call us at [phone] if you have not heard about the biopsies in 3 weeks.    SIGNATURES/CONFIDENTIALITY: You and/or your care partner have signed paperwork which will be entered into your electronic medical record.  These signatures attest to the fact that that the information above on  your After Visit Summary has been reviewed and is understood.  Full responsibility of the confidentiality of this discharge information lies with you and/or your care-partner. 

## 2018-10-14 NOTE — Progress Notes (Signed)
PT taken to PACU. Monitors in place. VSS. Report given to RN. 

## 2018-10-15 DIAGNOSIS — F419 Anxiety disorder, unspecified: Secondary | ICD-10-CM | POA: Diagnosis not present

## 2018-10-18 ENCOUNTER — Telehealth: Payer: Self-pay | Admitting: *Deleted

## 2018-10-18 ENCOUNTER — Telehealth: Payer: Self-pay

## 2018-10-18 NOTE — Telephone Encounter (Signed)
First follow up call attempt.  Message left on voicemail to call with questions or concerns.

## 2018-10-18 NOTE — Telephone Encounter (Signed)
Left message on 2nd follow up call. 

## 2018-10-21 ENCOUNTER — Ambulatory Visit (INDEPENDENT_AMBULATORY_CARE_PROVIDER_SITE_OTHER): Payer: 59 | Admitting: Pediatrics

## 2018-10-21 ENCOUNTER — Ambulatory Visit: Payer: 59 | Admitting: Sports Medicine

## 2018-10-21 ENCOUNTER — Other Ambulatory Visit: Payer: Self-pay

## 2018-10-21 VITALS — BP 144/74 | Ht 62.0 in | Wt 120.0 lb

## 2018-10-21 DIAGNOSIS — M25512 Pain in left shoulder: Secondary | ICD-10-CM

## 2018-10-21 NOTE — Progress Notes (Signed)
  EMJAY MONTIETH - 58 y.o. female MRN [id number]  Date of birth: [birth year]  SUBJECTIVE:   CC: acute left shoulder pain  58 yo female presenting with acute left shoulder pain starting 2 days ago. She reports that she was doing standing pushups at her desk at work and felt tightness and pain in her back under her scapula. The pain has persisted and interfered with her sleep last night. Pain does not change with motion of neck or shoulder. Describes it as a sharp intermittent pain with a constant ache. At worse, pain is 8/10. On the way over to the office, pain was 3/10. Tried ibuprofen with no relief. Husband massaged the area and she has tried to roll it out on a foam roller with no relief. Denies numbness/tingling down arm. No shooting pains.    ROS: No unexpected weight loss, fever, chills, swelling, instability, muscle pain, numbness/tingling, redness, otherwise see HPI   PMHx - Updated and reviewed.  Contributory factors include: Negative PSHx - Updated and reviewed.  Contributory factors include:  Negative FHx - Updated and reviewed.  Contributory factors include:  Negative Social Hx - Updated and reviewed. Contributory factors include: Negative Medications - reviewed   DATA REVIEWED: none  PHYSICAL EXAM:  VS: BP:(!) 144/74  HR: bpm  TEMP: ( )  RESP:   HT:5\' 2"  (157.5 cm)   WT:120 lb (54.4 kg)  BMI:21.94 PHYSICAL EXAM: Gen: NAD, alert, cooperative with exam, well-appearing HEENT: clear conjunctiva,  CV:  no edema, capillary refill brisk, normal rate Resp: non-labored Skin: no rashes, normal turgor  Neuro: no gross deficits.  Psych:  alert and oriented  Left shoulder: Inspection reveals no obvious deformity, atrophy, or asymmetry. No bruising. No swelling No TTP over AC joint or bicipital groove. Tender to palpation over myofascial knot in rhomboid at level of mid scapula. Full ROM in flexion, abduction, internal/external rotation NV intact distally Normal scapular  function observed. Special Tests:  - Supraspinatous: Negative empty can.  5/5 strength with resisted flexion at 20 degrees - Infraspinatous/Teres Minor: 5/5 strength with ER - Subscapularis: negative belly press, negative bear hug. 5/5 strength with IR  Full ROM of neck, Spurling negative  Right shoulder: No abnormality on inspection No TTP Full ROM Normal strength with resisted ER, IR, flexion   ASSESSMENT & PLAN:  Myofascial trigger point in left rhomboid at level of mid-scapula, likely aggravated by pushups. Discussed various options of treatment including PT referral for dry needling. However, she is leaving for a trip tomorrow and would like an immediate intervention if possible. She preferred to do a trigger point injection in office.  Procedure performed: trigger point injection, left rhomboid, mid scapula   Consent obtained and verified. Time-out conducted. Noted no overlying erythema, induration, or other signs of local infection. Trigger point in left rhomboid at mid scapula was palpated and marked at area of maximal tenderness. The overlying skin was prepped in a sterile fashion. Topical analgesic spray: Ethyl chloride. Needle: 5/8 inch needle, 25 gauge Completed without difficulty. Meds: 1 cc lidocaine  Patient felt immediate relief of pain. Advised to call if fevers/chills, erythema, induration, drainage, or persistent bleeding.  I was the preceptor for this visit and available for immediate consultation Shellia Cleverly, DO

## 2018-10-22 ENCOUNTER — Encounter: Payer: Self-pay | Admitting: Gastroenterology

## 2018-10-25 MED FILL — DICLOFENAC SODIUM 75 MG TAB: 75 | 90 days supply | Qty: 90 | Fill #0

## 2018-10-26 MED FILL — PREMARIN VAGINAL CREAM-APPL: 0.625 | 52 days supply | Qty: 30 | Fill #1

## 2018-11-02 DIAGNOSIS — F332 Major depressive disorder, recurrent severe without psychotic features: Secondary | ICD-10-CM | POA: Diagnosis not present

## 2018-11-02 DIAGNOSIS — F411 Generalized anxiety disorder: Secondary | ICD-10-CM | POA: Diagnosis not present

## 2018-11-02 DIAGNOSIS — E7800 Pure hypercholesterolemia, unspecified: Secondary | ICD-10-CM | POA: Diagnosis not present

## 2018-11-02 MED FILL — SERTRALINE HCL 100 MG TAB: 100 | 90 days supply | Qty: 180 | Fill #0

## 2018-11-02 MED FILL — GABAPENTIN 100 MG CAPSULE: 100 | 30 days supply | Qty: 60 | Fill #0

## 2018-11-09 MED FILL — ROSUVASTATIN CALCIUM 5 MG T: 5 | 90 days supply | Qty: 90 | Fill #0

## 2018-11-10 DIAGNOSIS — F419 Anxiety disorder, unspecified: Secondary | ICD-10-CM | POA: Diagnosis not present

## 2018-11-10 DIAGNOSIS — L84 Corns and callosities: Secondary | ICD-10-CM | POA: Diagnosis not present

## 2018-11-10 DIAGNOSIS — Z1283 Encounter for screening for malignant neoplasm of skin: Secondary | ICD-10-CM | POA: Diagnosis not present

## 2018-11-10 DIAGNOSIS — L821 Other seborrheic keratosis: Secondary | ICD-10-CM | POA: Diagnosis not present

## 2018-11-10 DIAGNOSIS — D045 Carcinoma in situ of skin of trunk: Secondary | ICD-10-CM | POA: Diagnosis not present

## 2018-11-10 MED FILL — buPROPion HCL ER (XL) 300 M: 300 | 90 days supply | Qty: 90 | Fill #0

## 2018-11-12 ENCOUNTER — Other Ambulatory Visit: Payer: Self-pay

## 2018-11-15 ENCOUNTER — Encounter: Payer: Self-pay | Admitting: Gynecology

## 2018-11-15 ENCOUNTER — Ambulatory Visit (INDEPENDENT_AMBULATORY_CARE_PROVIDER_SITE_OTHER): Payer: 59 | Admitting: Gynecology

## 2018-11-15 ENCOUNTER — Other Ambulatory Visit: Payer: Self-pay

## 2018-11-15 VITALS — BP 122/76 | Ht 62.0 in | Wt 118.0 lb

## 2018-11-15 DIAGNOSIS — N952 Postmenopausal atrophic vaginitis: Secondary | ICD-10-CM | POA: Diagnosis not present

## 2018-11-15 DIAGNOSIS — Z01419 Encounter for gynecological examination (general) (routine) without abnormal findings: Secondary | ICD-10-CM | POA: Diagnosis not present

## 2018-11-15 DIAGNOSIS — A609 Anogenital herpesviral infection, unspecified: Secondary | ICD-10-CM | POA: Diagnosis not present

## 2018-11-15 MED ORDER — PREMARIN 0.625 MG/GM VA CREA: TOPICAL_CREAM | VAGINAL | 4 refills | Status: DC

## 2018-11-15 NOTE — Progress Notes (Signed)
    Ann Mendez 07/13/1960 [id number]        58 y.o.  [id number] for annual gynecologic exam.  Doing well without gynecologic complaints.  Past medical history,surgical history, problem list, medications, allergies, family history and social history were all reviewed and documented as reviewed in the EPIC chart.  ROS:  Performed with pertinent positives and negatives included in the history, assessment and plan.   Additional significant findings : None   Exam: [name] assistant Vitals:   11/15/18 1113  BP: 122/76  Weight: 118 lb (53.5 kg)  Height: 5\' 2"  (1.575 m)   Body mass index is 21.58 kg/m.  General appearance:  Normal affect, orientation and appearance. Skin: Grossly normal HEENT: Without gross lesions.  No cervical or supraclavicular adenopathy. Thyroid normal.  Lungs:  Clear without wheezing, rales or rhonchi Cardiac: RR, without RMG Abdominal:  Soft, nontender, without masses, guarding, rebound, organomegaly or hernia Breasts:  Examined lying and sitting without masses, retractions, discharge or axillary adenopathy. Pelvic:  Ext, BUS, Vagina: With atrophic changes  Cervix: With atrophic changes  Uterus: Anteverted, normal size, shape and contour, midline and mobile nontender   Adnexa: Without masses or tenderness    Anus and perineum: Normal   Rectovaginal: Normal sphincter tone without palpated masses or tenderness.    Assessment/Plan:  58 y.o. [id number] female for annual gynecologic exam.   1. Postmenopausal/atrophic genital changes.  Continues on Premarin vaginal cream twice weekly.  Doing well with this.  No bleeding.  We have discussed the risks of absorption with systemic effects in the past to include thrombosis, breast and endometrial stimulation and she is comfortable with continuing.  Refill x1 year provided. 2. Pap smear/HPV 2019.  No Pap smear done today.  No history of abnormal Pap smears previously.  Plan repeat Pap smear/HPV screening at 5-year  interval per current screening guidelines. 3. Mammography 03/2018.  Continue with annual mammography when due.  Breast exam normal today. 4. DEXA 2012 normal.  Recommend follow-up DEXA now at age 76 and she will schedule in follow-up for this. 5. Colonoscopy 2020.  Repeat at their recommended interval. 6. History of HSV.  Uses acyclovir as needed.  Has supply at home but will call when needs more. 7. Health maintenance.  No routine lab work done as patient does this elsewhere.  Follow-up 1 year, sooner as needed.   Anastasio Auerbach MD, 11:56 AM 11/15/2018

## 2018-11-15 NOTE — Patient Instructions (Signed)
Follow-up for the bone density when scheduled.  Follow-up in 1 year for annual exam

## 2018-11-25 ENCOUNTER — Other Ambulatory Visit: Payer: Self-pay

## 2018-11-25 ENCOUNTER — Ambulatory Visit (INDEPENDENT_AMBULATORY_CARE_PROVIDER_SITE_OTHER): Payer: 59

## 2018-11-25 ENCOUNTER — Other Ambulatory Visit: Payer: Self-pay | Admitting: Gynecology

## 2018-11-25 ENCOUNTER — Encounter: Payer: Self-pay | Admitting: Gynecology

## 2018-11-25 DIAGNOSIS — Z780 Asymptomatic menopausal state: Secondary | ICD-10-CM

## 2018-11-25 DIAGNOSIS — N952 Postmenopausal atrophic vaginitis: Secondary | ICD-10-CM

## 2018-11-25 DIAGNOSIS — Z13820 Encounter for screening for osteoporosis: Secondary | ICD-10-CM | POA: Diagnosis not present

## 2018-12-15 DIAGNOSIS — F411 Generalized anxiety disorder: Secondary | ICD-10-CM | POA: Diagnosis not present

## 2018-12-15 DIAGNOSIS — F332 Major depressive disorder, recurrent severe without psychotic features: Secondary | ICD-10-CM | POA: Diagnosis not present

## 2018-12-17 DIAGNOSIS — F411 Generalized anxiety disorder: Secondary | ICD-10-CM | POA: Diagnosis not present

## 2018-12-17 MED FILL — CONCERTA 54 MG TABLET ER: 54 | 30 days supply | Qty: 30 | Fill #0

## 2018-12-22 DIAGNOSIS — X32XXXD Exposure to sunlight, subsequent encounter: Secondary | ICD-10-CM | POA: Diagnosis not present

## 2018-12-22 DIAGNOSIS — D0471 Carcinoma in situ of skin of right lower limb, including hip: Secondary | ICD-10-CM | POA: Diagnosis not present

## 2018-12-22 DIAGNOSIS — L570 Actinic keratosis: Secondary | ICD-10-CM | POA: Diagnosis not present

## 2018-12-23 MED FILL — ARIPIPRAZOLE 5 MG TABS: 5 | 90 days supply | Qty: 90 | Fill #0

## 2018-12-30 DIAGNOSIS — F419 Anxiety disorder, unspecified: Secondary | ICD-10-CM | POA: Diagnosis not present

## 2019-01-12 DIAGNOSIS — F332 Major depressive disorder, recurrent severe without psychotic features: Secondary | ICD-10-CM | POA: Diagnosis not present

## 2019-01-12 DIAGNOSIS — F411 Generalized anxiety disorder: Secondary | ICD-10-CM | POA: Diagnosis not present

## 2019-01-13 MED FILL — CONCERTA 54 MG TABLET ER: 54 | 30 days supply | Qty: 30 | Fill #0

## 2019-01-28 MED FILL — SERTRALINE HCL 100 MG TAB: 100 | 90 days supply | Qty: 180 | Fill #1

## 2019-02-01 DIAGNOSIS — L820 Inflamed seborrheic keratosis: Secondary | ICD-10-CM | POA: Diagnosis not present

## 2019-02-01 DIAGNOSIS — L905 Scar conditions and fibrosis of skin: Secondary | ICD-10-CM | POA: Diagnosis not present

## 2019-02-01 DIAGNOSIS — Z08 Encounter for follow-up examination after completed treatment for malignant neoplasm: Secondary | ICD-10-CM | POA: Diagnosis not present

## 2019-02-01 DIAGNOSIS — Z85828 Personal history of other malignant neoplasm of skin: Secondary | ICD-10-CM | POA: Diagnosis not present

## 2019-02-07 DIAGNOSIS — H524 Presbyopia: Secondary | ICD-10-CM | POA: Diagnosis not present

## 2019-02-07 MED FILL — BUPROPION HCL XL 300 MG TAB: 300 | 90 days supply | Qty: 90 | Fill #1

## 2019-02-08 MED FILL — PREMARIN VAGINAL CREAM-APPL: 0.625 | 52 days supply | Qty: 30 | Fill #0

## 2019-02-09 ENCOUNTER — Other Ambulatory Visit: Payer: Self-pay

## 2019-02-09 DIAGNOSIS — F332 Major depressive disorder, recurrent severe without psychotic features: Secondary | ICD-10-CM | POA: Diagnosis not present

## 2019-02-09 DIAGNOSIS — F411 Generalized anxiety disorder: Secondary | ICD-10-CM | POA: Diagnosis not present

## 2019-02-09 MED ORDER — PREMARIN 0.625 MG/GM VA CREA
TOPICAL_CREAM | VAGINAL | 4 refills | Status: DC
Start: 2019-02-09 — End: 2019-11-24

## 2019-02-11 MED FILL — CONCERTA 54 MG TABLET ER: 54 | 30 days supply | Qty: 30 | Fill #0

## 2019-02-24 ENCOUNTER — Encounter: Payer: Self-pay | Admitting: Podiatry

## 2019-02-24 ENCOUNTER — Other Ambulatory Visit: Payer: Self-pay | Admitting: Podiatry

## 2019-02-24 ENCOUNTER — Ambulatory Visit (INDEPENDENT_AMBULATORY_CARE_PROVIDER_SITE_OTHER): Payer: 59 | Admitting: Podiatry

## 2019-02-24 ENCOUNTER — Ambulatory Visit (INDEPENDENT_AMBULATORY_CARE_PROVIDER_SITE_OTHER): Payer: 59

## 2019-02-24 ENCOUNTER — Other Ambulatory Visit: Payer: Self-pay

## 2019-02-24 DIAGNOSIS — M21612 Bunion of left foot: Secondary | ICD-10-CM

## 2019-02-24 DIAGNOSIS — R0989 Other specified symptoms and signs involving the circulatory and respiratory systems: Secondary | ICD-10-CM

## 2019-02-24 DIAGNOSIS — M21611 Bunion of right foot: Secondary | ICD-10-CM

## 2019-02-24 DIAGNOSIS — M79675 Pain in left toe(s): Secondary | ICD-10-CM

## 2019-02-24 DIAGNOSIS — M21619 Bunion of unspecified foot: Secondary | ICD-10-CM | POA: Diagnosis not present

## 2019-02-24 DIAGNOSIS — M2022 Hallux rigidus, left foot: Secondary | ICD-10-CM | POA: Diagnosis not present

## 2019-02-24 DIAGNOSIS — M7989 Other specified soft tissue disorders: Secondary | ICD-10-CM | POA: Diagnosis not present

## 2019-03-01 ENCOUNTER — Telehealth: Payer: Self-pay | Admitting: *Deleted

## 2019-03-01 DIAGNOSIS — M2022 Hallux rigidus, left foot: Secondary | ICD-10-CM | POA: Insufficient documentation

## 2019-03-01 DIAGNOSIS — M7989 Other specified soft tissue disorders: Secondary | ICD-10-CM | POA: Insufficient documentation

## 2019-03-01 DIAGNOSIS — M21619 Bunion of unspecified foot: Secondary | ICD-10-CM | POA: Insufficient documentation

## 2019-03-01 NOTE — Telephone Encounter (Signed)
-----   Message from Trula Slade, DPM sent at 03/01/2019  7:24 AM EST ----- Can you please order arterial studies to be preformed at the [facility] location? Thanks.

## 2019-03-01 NOTE — Telephone Encounter (Signed)
Ordered the arterial studies at [facility] today. Ann Mendez

## 2019-03-01 NOTE — Progress Notes (Signed)
Subjective:   Patient ID: Jan Fireman, female   DOB: 59 y.o.   MRN: [id number]   HPI 59 year old female presents the office today with concerns of bilateral foot pain on the left side worse than the right. On the left side she states that she has problems with the big toe joint is not bend and she has quite a bit of discomfort with certain shoes. She has tried changing shoes but continues to have discomfort on a regular basis. She also has a bunion on her right side but not causing significant pain but she recently has noticed a knot behind the bunion site on the medial aspect the foot that she points to. It has not increased in size at no significant pain. She does have a family history of arterial disease it sounds like. She denies any claudication symptoms.    Review of Systems  All other systems reviewed and are negative.  Past Medical History:  Diagnosis Date  . Allergy   . Anxiety   . Arthritis    left great toe  . 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 Complex tear of medial meniscus of right knee 01/01/2017  . Depression   . Hyperlipidemia   . Lateral meniscus tear 12/2016   right medial    Past Surgical History:  Procedure Laterality Date  . CATARACT EXTRACTION W/ INTRAOCULAR LENS IMPLANT Right 03/2015  . CESAREAN SECTION  09/23/2000  . COLONOSCOPY    . KNEE ARTHROSCOPY Right 01/01/2017   Procedure: RIGHT KNEE ARTHROSCOPY, CHONDROPLASTY AND PARTIAL MEDIAL LATERAL MENISCECTOMY;  Surgeon: Marchia Bond, MD;  Location: [facility];  Service: Orthopedics;  Laterality: Right;  . SKIN CANCER EXCISION    . TUBAL LIGATION  09/23/2000     Current Outpatient Medications:  .  ARIPiprazole (ABILIFY PO), Take 2.5 mg by mouth at bedtime. , Disp: , Rfl:  .  b complex vitamins tablet, Take 1 tablet by mouth daily., Disp: , Rfl:  .  buPROPion (WELLBUTRIN XL) 300 MG 24 hr tablet, Take 300 mg by mouth daily. , Disp: , Rfl:  .  Calcium Carbonate (CALCIUM 600 PO),  Take by mouth., Disp: , Rfl:  .  cholecalciferol (VITAMIN D) 1000 UNITS tablet, Take 2,000 Units by mouth daily. , Disp: , Rfl:  .  sertraline (ZOLOFT) 100 MG tablet, Take 200 mg by mouth daily. , Disp: , Rfl:  .  acyclovir (ZOVIRAX) 200 MG capsule, Take 2 capsules (400 mg total) by mouth 2 (two) times daily. (Patient not taking: Reported on 02/24/2019), Disp: 60 capsule, Rfl: 4 .  conjugated estrogens (PREMARIN) vaginal cream, APPLY 2 GRAMS VAGINALLY TWICE WEEKLY (Patient not taking: Reported on 02/24/2019), Disp: 30 g, Rfl: 4 .  Diclofenac Sodium (VOLTAREN PO), Take by mouth daily with breakfast. , Disp: , Rfl:  .  gabapentin (NEURONTIN) 800 MG tablet, Take 100 mg by mouth daily. , Disp: , Rfl:  .  methylphenidate 54 MG PO CR tablet, Take 54 mg by mouth every morning., Disp: , Rfl:   Allergies  Allergen Reactions  . Sulfa Antibiotics Other (See Comments)    UNKNOWN          Objective:  Physical Exam  General: AAO x3, NAD  Dermatological: Skin is warm, dry and supple bilateral. Nails x 10 are well manicured; remaining integument appears unremarkable at this time. There are no open sores, no preulcerative lesions, no rash or signs of infection present.  Vascular: Dorsalis Pedis artery and Posterior Tibial  artery pedal pulses are 2/4 bilateral with immedate capillary fill time. Pedal hair growth present. No varicosities and no lower extremity edema present bilateral. There is no pain with calf compression, swelling, warmth, erythema.   Neruologic: Grossly intact via light touch bilateral. Vibratory intact via tuning fork bilateral. Protective threshold with Semmes Wienstein monofilament intact to all pedal sites bilateral.   Musculoskeletal: Minimal range of motion present the left foot and there is significant arthritic changes present the first MPJ on the left side. Significant bunion deformities present in the right side. Just proximal to the medial bunion site is a firm nonmobile soft  tissue lesion consistent with a small fibroma. Is no overlying erythema there is no significant pain. Muscular strength 5/5 in all groups tested bilateral.  Gait: Unassisted, Nonantalgic.       Assessment:   Hallux rigidus left foot, bunion deformity left foot with soft tissue lesion     Plan:  -Treatment options discussed including all alternatives, risks, and complications -Etiology of symptoms were discussed -X-rays were obtained and reviewed with the patient. Bunion deformity present on the right foot and arthritic changes present of the left first MPJ. There is no evidence of acute fracture or calcifications present. -Regards to the left foot this seems to be causing majority of her issues. We discussed the first MPJ arthrodesis. Also discussed conservative treatments including custom orthotic with a Morton's extension. -On the right for the bunion is not causing significant pain. Discussed wearing supportive shoes and offloading and padding. Regard to the soft tissue lesion showed to have this removed. Discussed excision of this area in the office we can schedule her to do so. She does have a family history of what seems to be arterial disease. She is concerned about any surgery in her foot. We will check arterial studies prior to any invasive procedure.  Return for right foot soft tissue mass excision after circulation test.  Trula Slade DPM

## 2019-03-01 NOTE — Addendum Note (Signed)
Addended by: Cranford Mon R on: 03/01/2019 08:16 AM   Modules accepted: Orders

## 2019-03-08 DIAGNOSIS — F411 Generalized anxiety disorder: Secondary | ICD-10-CM | POA: Diagnosis not present

## 2019-03-08 DIAGNOSIS — F332 Major depressive disorder, recurrent severe without psychotic features: Secondary | ICD-10-CM | POA: Diagnosis not present

## 2019-03-08 MED FILL — ARIPIPRAZOLE 5 MG TABS: 5 | 30 days supply | Qty: 15 | Fill #0

## 2019-03-11 MED FILL — CONCERTA 54 MG TABLET ER: 54 | 30 days supply | Qty: 30 | Fill #0

## 2019-03-16 ENCOUNTER — Encounter (HOSPITAL_COMMUNITY): Payer: 59

## 2019-03-30 ENCOUNTER — Encounter: Payer: Self-pay | Admitting: Obstetrics and Gynecology

## 2019-03-30 DIAGNOSIS — Z1231 Encounter for screening mammogram for malignant neoplasm of breast: Secondary | ICD-10-CM | POA: Diagnosis not present

## 2019-04-13 DIAGNOSIS — F332 Major depressive disorder, recurrent severe without psychotic features: Secondary | ICD-10-CM | POA: Diagnosis not present

## 2019-04-13 DIAGNOSIS — F411 Generalized anxiety disorder: Secondary | ICD-10-CM | POA: Diagnosis not present

## 2019-04-13 MED FILL — CONCERTA 54 MG TABLET ER: 54 | 30 days supply | Qty: 30 | Fill #0

## 2019-04-14 ENCOUNTER — Other Ambulatory Visit (HOSPITAL_COMMUNITY): Payer: Self-pay | Admitting: Optometry

## 2019-04-14 MED FILL — SODIUM CHLORIDE 0.9 % NEBU: 0.9 | 90 days supply | Qty: 300 | Fill #0

## 2019-04-15 ENCOUNTER — Other Ambulatory Visit (HOSPITAL_COMMUNITY): Payer: Self-pay | Admitting: Neurology

## 2019-04-15 MED FILL — ARIPIPRAZOLE 5 MG TABS: 5 | 30 days supply | Qty: 15 | Fill #0

## 2019-04-28 MED FILL — SERTRALINE HCL 100 MG TAB: 100 | 90 days supply | Qty: 180 | Fill #2

## 2019-04-28 MED FILL — DICLOFENAC SODIUM 75 MG TAB: 75 | 90 days supply | Qty: 90 | Fill #1

## 2019-05-05 MED FILL — buPROPion HCL ER (XL) 300 M: 300 | 90 days supply | Qty: 90 | Fill #2

## 2019-05-09 DIAGNOSIS — H40053 Ocular hypertension, bilateral: Secondary | ICD-10-CM | POA: Diagnosis not present

## 2019-05-17 MED FILL — CONCERTA 54 MG TABLET ER: 54 | 30 days supply | Qty: 30 | Fill #0

## 2019-05-30 MED FILL — PREMARIN VAGINAL CREAM-APPL: 0.625 | 52 days supply | Qty: 30 | Fill #1

## 2019-06-08 DIAGNOSIS — F411 Generalized anxiety disorder: Secondary | ICD-10-CM | POA: Diagnosis not present

## 2019-06-08 DIAGNOSIS — F332 Major depressive disorder, recurrent severe without psychotic features: Secondary | ICD-10-CM | POA: Diagnosis not present

## 2019-06-08 MED FILL — ARIPIPRAZOLE 2 MG TABS: 2 | 30 days supply | Qty: 30 | Fill #0

## 2019-06-10 MED FILL — ROSUVASTATIN CALCIUM 5 MG T: 5 | 90 days supply | Qty: 90 | Fill #1

## 2019-06-11 MED FILL — CONCERTA 54 MG TABLET ER: 54 | 30 days supply | Qty: 30 | Fill #0

## 2019-06-16 DIAGNOSIS — M25511 Pain in right shoulder: Secondary | ICD-10-CM | POA: Diagnosis not present

## 2019-07-06 ENCOUNTER — Other Ambulatory Visit: Payer: Self-pay | Admitting: *Deleted

## 2019-07-06 ENCOUNTER — Other Ambulatory Visit: Payer: Self-pay | Admitting: Obstetrics and Gynecology

## 2019-07-06 MED ORDER — ACYCLOVIR 200 MG PO CAPS: 400.0000 mg | ORAL_CAPSULE | Freq: Two times a day (BID) | ORAL | 4 refills | Status: DC

## 2019-07-06 MED FILL — ACYCLOVIR 200 MG CAP: 200 | 15 days supply | Qty: 60 | Fill #0

## 2019-07-12 MED FILL — ARIPIPRAZOLE 2 MG TABS: 2 | 30 days supply | Qty: 30 | Fill #1

## 2019-07-14 DIAGNOSIS — E039 Hypothyroidism, unspecified: Secondary | ICD-10-CM | POA: Diagnosis not present

## 2019-07-14 DIAGNOSIS — Z0000 Encounter for general adult medical examination without abnormal findings: Secondary | ICD-10-CM | POA: Diagnosis not present

## 2019-07-14 DIAGNOSIS — E7800 Pure hypercholesterolemia, unspecified: Secondary | ICD-10-CM | POA: Diagnosis not present

## 2019-07-14 DIAGNOSIS — E559 Vitamin D deficiency, unspecified: Secondary | ICD-10-CM | POA: Diagnosis not present

## 2019-07-14 DIAGNOSIS — Z1211 Encounter for screening for malignant neoplasm of colon: Secondary | ICD-10-CM | POA: Diagnosis not present

## 2019-07-19 DIAGNOSIS — L570 Actinic keratosis: Secondary | ICD-10-CM | POA: Diagnosis not present

## 2019-07-19 DIAGNOSIS — X32XXXD Exposure to sunlight, subsequent encounter: Secondary | ICD-10-CM | POA: Diagnosis not present

## 2019-07-19 MED FILL — CONCERTA 54 MG TABLET ER: 54 | 30 days supply | Qty: 30 | Fill #0

## 2019-07-26 DIAGNOSIS — F332 Major depressive disorder, recurrent severe without psychotic features: Secondary | ICD-10-CM | POA: Diagnosis not present

## 2019-07-26 DIAGNOSIS — F411 Generalized anxiety disorder: Secondary | ICD-10-CM | POA: Diagnosis not present

## 2019-08-01 MED FILL — SERTRALINE HCL 100 MG TABS: 100 | 90 days supply | Qty: 180 | Fill #0

## 2019-08-04 MED FILL — buPROPion HCL ER (XL) 300 M: 300 | 90 days supply | Qty: 90 | Fill #0

## 2019-08-22 MED FILL — CONCERTA 54 MG TABLET ER: 54 | 30 days supply | Qty: 30 | Fill #0

## 2019-09-06 ENCOUNTER — Other Ambulatory Visit (HOSPITAL_COMMUNITY): Payer: Self-pay | Admitting: Family Medicine

## 2019-09-06 MED FILL — DICLOFENAC SODIUM 75 MG TAB: 75 | 90 days supply | Qty: 90 | Fill #0

## 2019-09-06 MED FILL — ROSUVASTATIN CALCIUM 5 MG T: 5 | 90 days supply | Qty: 90 | Fill #0

## 2019-09-06 MED FILL — ARIPIPRAZOLE 2 MG TABS: 2 | 30 days supply | Qty: 30 | Fill #0

## 2019-09-20 MED FILL — CONCERTA 54 MG TABLET ER: 54 | 30 days supply | Qty: 30 | Fill #0

## 2019-09-27 ENCOUNTER — Other Ambulatory Visit (HOSPITAL_COMMUNITY): Payer: Self-pay | Admitting: Neurology

## 2019-09-27 DIAGNOSIS — F411 Generalized anxiety disorder: Secondary | ICD-10-CM | POA: Diagnosis not present

## 2019-09-27 DIAGNOSIS — F332 Major depressive disorder, recurrent severe without psychotic features: Secondary | ICD-10-CM | POA: Diagnosis not present

## 2019-09-29 MED FILL — PREMARIN VAGINAL CREAM-APPL: 0.625 | 52 days supply | Qty: 30 | Fill #2

## 2019-10-11 MED FILL — ARIPIPRAZOLE 2 MG TABS: 2 | 30 days supply | Qty: 30 | Fill #1

## 2019-10-17 ENCOUNTER — Other Ambulatory Visit (HOSPITAL_COMMUNITY): Payer: Self-pay | Admitting: Neurology

## 2019-10-17 MED FILL — CONCERTA 54 MG TABLET ER: 54 | 30 days supply | Qty: 30 | Fill #0

## 2019-10-25 ENCOUNTER — Other Ambulatory Visit (HOSPITAL_COMMUNITY): Payer: Self-pay | Admitting: Internal Medicine

## 2019-10-25 MED FILL — FLUARIX QUADRIVALENT 0.5 ML: 0.5 | 1 days supply | Qty: 1 | Fill #0

## 2019-10-28 MED FILL — SERTRALINE HCL 100 MG TABS: 100 | 90 days supply | Qty: 180 | Fill #0

## 2019-11-01 MED FILL — buPROPion HCL ER (XL) 300 M: 300 | 90 days supply | Qty: 90 | Fill #0

## 2019-11-09 ENCOUNTER — Other Ambulatory Visit (HOSPITAL_COMMUNITY): Payer: Self-pay | Admitting: Neurology

## 2019-11-09 MED FILL — ARIPIPRAZOLE 2 MG TABS: 2 | 30 days supply | Qty: 30 | Fill #0

## 2019-11-16 ENCOUNTER — Other Ambulatory Visit (HOSPITAL_COMMUNITY): Payer: Self-pay | Admitting: Neurology

## 2019-11-16 MED FILL — CONCERTA 54 MG TABLET ER: 54 | 30 days supply | Qty: 30 | Fill #0

## 2019-11-22 DIAGNOSIS — F411 Generalized anxiety disorder: Secondary | ICD-10-CM | POA: Diagnosis not present

## 2019-11-22 DIAGNOSIS — F332 Major depressive disorder, recurrent severe without psychotic features: Secondary | ICD-10-CM | POA: Diagnosis not present

## 2019-11-24 ENCOUNTER — Ambulatory Visit (INDEPENDENT_AMBULATORY_CARE_PROVIDER_SITE_OTHER): Payer: 59 | Admitting: Obstetrics and Gynecology

## 2019-11-24 ENCOUNTER — Other Ambulatory Visit: Payer: Self-pay

## 2019-11-24 ENCOUNTER — Encounter: Payer: Self-pay | Admitting: Obstetrics and Gynecology

## 2019-11-24 ENCOUNTER — Other Ambulatory Visit: Payer: Self-pay | Admitting: Obstetrics and Gynecology

## 2019-11-24 VITALS — BP 140/80 | Ht 61.5 in | Wt 119.0 lb

## 2019-11-24 DIAGNOSIS — N952 Postmenopausal atrophic vaginitis: Secondary | ICD-10-CM

## 2019-11-24 DIAGNOSIS — A609 Anogenital herpesviral infection, unspecified: Secondary | ICD-10-CM | POA: Diagnosis not present

## 2019-11-24 DIAGNOSIS — Z01419 Encounter for gynecological examination (general) (routine) without abnormal findings: Secondary | ICD-10-CM | POA: Diagnosis not present

## 2019-11-24 MED ORDER — PREMARIN 0.625 MG/GM VA CREA: TOPICAL_CREAM | VAGINAL | 2 refills | Status: DC

## 2019-11-24 MED FILL — PREMARIN VAGINAL CREAM-APPL: 0.625 | 52 days supply | Qty: 30 | Fill #0

## 2019-11-24 NOTE — Progress Notes (Signed)
Ann Mendez 1960-04-05 [id number]  SUBJECTIVE:  59 y.o. [id number] female for annual routine gynecologic exam. She has no gynecologic concerns.  Current Outpatient Medications  Medication Sig Dispense Refill  . acyclovir (ZOVIRAX) 200 MG capsule Take 2 capsules (400 mg total) by mouth 2 (two) times daily. 60 capsule 4  . ARIPiprazole (ABILIFY PO) Take 2.5 mg by mouth at bedtime.     Marland Kitchen b complex vitamins tablet Take 1 tablet by mouth daily.    Marland Kitchen buPROPion (WELLBUTRIN XL) 300 MG 24 hr tablet Take 300 mg by mouth daily.     . Calcium Carbonate (CALCIUM 600 PO) Take by mouth.    . cholecalciferol (VITAMIN D) 1000 UNITS tablet Take 2,000 Units by mouth daily.     Marland Kitchen conjugated estrogens (PREMARIN) vaginal cream APPLY 2 GRAMS VAGINALLY TWICE WEEKLY 60 g 2  . Diclofenac Sodium (VOLTAREN PO) Take by mouth daily with breakfast.     . gabapentin (NEURONTIN) 800 MG tablet Take 100 mg by mouth daily.     . methylphenidate 54 MG PO CR tablet Take 54 mg by mouth every morning.    . rosuvastatin (CRESTOR) 5 MG tablet Take 5 mg by mouth daily.    . sertraline (ZOLOFT) 100 MG tablet Take 200 mg by mouth daily.      No current facility-administered medications for this visit.   Allergies: Sulfa antibiotics  Patient's last menstrual period was 10/16/2011.  Past medical history,surgical history, problem list, medications, allergies, family history and social history were all reviewed and documented as reviewed in the EPIC chart.  ROS: Pertinent positives and negatives as reviewed in HPI    OBJECTIVE:  BP 140/80   Ht 5' 1.5" (1.562 m)   Wt 119 lb (54 kg)   LMP 10/16/2011   BMI 22.12 kg/m  The patient appears well, alert, oriented, in no distress. Lungs are clear, good air entry, no wheezes, rhonchi or rales. S1 and S2 normal, no murmurs, regular rate and rhythm.  Abdomen soft without tenderness, guarding, mass or organomegaly.  Neurological is normal, no focal findings.  BREAST EXAM: breasts  appear normal, no suspicious masses, no skin or nipple changes or axillary nodes  PELVIC EXAM: VULVA: normal appearing vulva with atrophic change, no masses, tenderness or lesions, VAGINA: normal appearing vagina with atrophic change, normal color and discharge, no lesions, CERVIX: normal appearing cervix without discharge or lesions, UTERUS: uterus is normal size, shape, consistency and nontender, ADNEXA: normal adnexa in size, nontender and no masses  Chaperone: Thurnell Garbe present during the examination  ASSESSMENT:  59 y.o. [id number] here for annual gynecologic exam  PLAN:   1. Postmenopausal/atrophic genital changes. Would like to continue on Premarin cream twice weekly. She is not had any vaginal bleeding. She knows the risks of systemic estrogen absorption to include thrombotic diseases and the breast cancer and endometrial stimulation possibilities. Refill x1 year provided. 2. Pap smear /HPV 2019.  No significant history of abnormal Pap smears.  Next Pap smear due 2024 following the current guidelines recommending the 5 year interval. 3. Mammogram 03/2019.  Normal breast exam today.  She is reminded to schedule an annual mammogram when due. 4. Colonoscopy 2020.  Follow up at the recommended interval per her GI recommendations. 5.  History of HSV. Periodically uses acyclovir as needed. She still has a supply but will notify us if she needs refills. 6. DEXA 11/2018 normal.  Next DEXA recommended 2025. 7. Health maintenance. Mildly elevated systolic blood pressure today,  to keep track of. No labs today as she normally has these completed elsewhere.  Return annually or sooner, prn.  Joseph Pierini MD 11/24/19

## 2019-12-12 MED FILL — ROSUVASTATIN CALCIUM 5 MG T: 5 | 90 days supply | Qty: 90 | Fill #1

## 2019-12-12 MED FILL — ARIPIPRAZOLE 2 MG TABS: 2 | 30 days supply | Qty: 30 | Fill #1

## 2019-12-15 ENCOUNTER — Other Ambulatory Visit (HOSPITAL_COMMUNITY): Payer: Self-pay | Admitting: Family Medicine

## 2019-12-15 MED FILL — DICLOFENAC SODIUM 75 MG TAB: 75 | 90 days supply | Qty: 90 | Fill #0

## 2019-12-15 MED FILL — CONCERTA 54 MG TABLET ER: 54 | 30 days supply | Qty: 30 | Fill #0

## 2019-12-22 MED FILL — SODIUM CHLORIDE 0.9% INHAL: 0.9 | 90 days supply | Qty: 500 | Fill #1

## 2020-01-03 ENCOUNTER — Telehealth: Payer: Self-pay

## 2020-01-03 ENCOUNTER — Other Ambulatory Visit (HOSPITAL_COMMUNITY): Payer: Self-pay | Admitting: Obstetrics and Gynecology

## 2020-01-03 MED ORDER — LIDOCAINE-HYDROCORTISONE ACE 3-0.5 % EX CREA: TOPICAL_CREAM | CUTANEOUS | 0 refills | Status: DC

## 2020-01-03 MED FILL — LIDOCAINE-HC 3-0.5% CREAM: 3-0.5 | 28 days supply | Qty: 85 | Fill #0

## 2020-01-03 NOTE — Telephone Encounter (Signed)
Patient called asking if she could get a prescription for Lidocaine cream 3%/HC Cream 0.5%. She said years ago Dr. Cherylann Banas gave her a box of little packets of it (Sounds like samples) and she has used it for occasional hemorrhoid and HSV.  She said it works great and she would like Rx if possible.

## 2020-01-03 NOTE — Telephone Encounter (Signed)
I did sign the prescription, thank you

## 2020-01-04 NOTE — Telephone Encounter (Signed)
Message left for patient that Rx sent

## 2020-01-12 ENCOUNTER — Other Ambulatory Visit (HOSPITAL_COMMUNITY): Payer: Self-pay | Admitting: Neurology

## 2020-01-12 MED FILL — CONCERTA 54 MG TABLET ER: 54 | 30 days supply | Qty: 30 | Fill #0

## 2020-01-12 MED FILL — ARIPIPRAZOLE 2 MG TABS: 2 | 30 days supply | Qty: 30 | Fill #0

## 2020-01-24 MED FILL — SERTRALINE HCL 100 MG TABS: 100 | 90 days supply | Qty: 180 | Fill #1

## 2020-01-27 MED FILL — ACYCLOVIR 200 MG CAP: 200 | 15 days supply | Qty: 60 | Fill #1

## 2020-01-31 DIAGNOSIS — F332 Major depressive disorder, recurrent severe without psychotic features: Secondary | ICD-10-CM | POA: Diagnosis not present

## 2020-01-31 DIAGNOSIS — F411 Generalized anxiety disorder: Secondary | ICD-10-CM | POA: Diagnosis not present

## 2020-01-31 MED FILL — buPROPion HCL ER (XL) 300 M: 300 | 90 days supply | Qty: 90 | Fill #1

## 2020-02-13 ENCOUNTER — Other Ambulatory Visit (HOSPITAL_COMMUNITY): Payer: Self-pay | Admitting: Neurology

## 2020-02-13 DIAGNOSIS — H524 Presbyopia: Secondary | ICD-10-CM | POA: Diagnosis not present

## 2020-02-13 MED FILL — CONCERTA 54 MG TABLET ER: 54 | 30 days supply | Qty: 30 | Fill #0

## 2020-02-13 MED FILL — ARIPIPRAZOLE 2 MG TABS: 2 | 30 days supply | Qty: 30 | Fill #1

## 2020-03-07 ENCOUNTER — Other Ambulatory Visit (HOSPITAL_COMMUNITY): Payer: Self-pay | Admitting: Family Medicine

## 2020-03-07 MED FILL — ROSUVASTATIN CALCIUM 5 MG T: 5 | 90 days supply | Qty: 90 | Fill #0

## 2020-03-09 MED FILL — ARIPIPRAZOLE 2 MG TABS: 2 | 90 days supply | Qty: 90 | Fill #0

## 2020-03-13 ENCOUNTER — Other Ambulatory Visit (HOSPITAL_COMMUNITY): Payer: Self-pay | Admitting: Neurology

## 2020-03-13 MED FILL — DICLOFENAC SODIUM 75 MG TAB: 75 | 90 days supply | Qty: 90 | Fill #1

## 2020-03-13 MED FILL — CONCERTA 54 MG TABLET ER: 54 | 30 days supply | Qty: 30 | Fill #0

## 2020-03-27 DIAGNOSIS — F332 Major depressive disorder, recurrent severe without psychotic features: Secondary | ICD-10-CM | POA: Diagnosis not present

## 2020-03-27 DIAGNOSIS — F411 Generalized anxiety disorder: Secondary | ICD-10-CM | POA: Diagnosis not present

## 2020-04-04 DIAGNOSIS — Z1231 Encounter for screening mammogram for malignant neoplasm of breast: Secondary | ICD-10-CM | POA: Diagnosis not present

## 2020-04-06 ENCOUNTER — Other Ambulatory Visit (HOSPITAL_BASED_OUTPATIENT_CLINIC_OR_DEPARTMENT_OTHER): Payer: Self-pay

## 2020-04-10 DIAGNOSIS — M25511 Pain in right shoulder: Secondary | ICD-10-CM | POA: Diagnosis not present

## 2020-04-10 DIAGNOSIS — R2689 Other abnormalities of gait and mobility: Secondary | ICD-10-CM | POA: Diagnosis not present

## 2020-04-12 ENCOUNTER — Other Ambulatory Visit (HOSPITAL_COMMUNITY): Payer: Self-pay | Admitting: Neurology

## 2020-04-12 MED FILL — CONCERTA 54 MG TABLET ER: 54 | 30 days supply | Qty: 30 | Fill #0

## 2020-04-13 DIAGNOSIS — R2689 Other abnormalities of gait and mobility: Secondary | ICD-10-CM | POA: Diagnosis not present

## 2020-04-13 DIAGNOSIS — M25511 Pain in right shoulder: Secondary | ICD-10-CM | POA: Diagnosis not present

## 2020-04-19 DIAGNOSIS — M25511 Pain in right shoulder: Secondary | ICD-10-CM | POA: Diagnosis not present

## 2020-04-19 DIAGNOSIS — R2689 Other abnormalities of gait and mobility: Secondary | ICD-10-CM | POA: Diagnosis not present

## 2020-04-23 ENCOUNTER — Other Ambulatory Visit (HOSPITAL_COMMUNITY): Payer: Self-pay

## 2020-04-25 ENCOUNTER — Other Ambulatory Visit (HOSPITAL_COMMUNITY): Payer: Self-pay

## 2020-04-26 ENCOUNTER — Other Ambulatory Visit (HOSPITAL_COMMUNITY): Payer: Self-pay

## 2020-04-26 DIAGNOSIS — R2689 Other abnormalities of gait and mobility: Secondary | ICD-10-CM | POA: Diagnosis not present

## 2020-04-26 DIAGNOSIS — M25511 Pain in right shoulder: Secondary | ICD-10-CM | POA: Diagnosis not present

## 2020-04-26 MED ORDER — SERTRALINE HCL 100 MG PO TABS
200.0000 mg | ORAL_TABLET | Freq: Every day | ORAL | 2 refills | Status: DC
  Filled 2020-04-26: qty 180, 90d supply, fill #0
  Filled 2020-07-24: qty 180, 90d supply, fill #1

## 2020-04-27 ENCOUNTER — Other Ambulatory Visit (HOSPITAL_COMMUNITY): Payer: Self-pay

## 2020-05-01 ENCOUNTER — Other Ambulatory Visit (HOSPITAL_COMMUNITY): Payer: Self-pay

## 2020-05-01 MED FILL — Bupropion HCl Tab ER 24HR 300 MG: ORAL | 90 days supply | Qty: 90 | Fill #0 | Status: AC

## 2020-05-03 ENCOUNTER — Other Ambulatory Visit (HOSPITAL_COMMUNITY): Payer: Self-pay

## 2020-05-10 DIAGNOSIS — R2689 Other abnormalities of gait and mobility: Secondary | ICD-10-CM | POA: Diagnosis not present

## 2020-05-10 DIAGNOSIS — M25511 Pain in right shoulder: Secondary | ICD-10-CM | POA: Diagnosis not present

## 2020-05-12 ENCOUNTER — Other Ambulatory Visit (HOSPITAL_COMMUNITY): Payer: Self-pay

## 2020-05-12 MED FILL — Estrogens, Conjugated Vaginal Cream 0.625 MG/GM: VAGINAL | 90 days supply | Qty: 60 | Fill #0 | Status: AC

## 2020-05-15 ENCOUNTER — Other Ambulatory Visit (HOSPITAL_COMMUNITY): Payer: Self-pay

## 2020-05-15 MED ORDER — METHYLPHENIDATE HCL ER (OSM) 54 MG PO TBCR
54.0000 mg | EXTENDED_RELEASE_TABLET | Freq: Every day | ORAL | 0 refills | Status: DC
  Filled 2020-05-15: qty 30, 30d supply, fill #0

## 2020-05-17 ENCOUNTER — Other Ambulatory Visit (HOSPITAL_COMMUNITY): Payer: Self-pay

## 2020-05-22 DIAGNOSIS — F411 Generalized anxiety disorder: Secondary | ICD-10-CM | POA: Diagnosis not present

## 2020-05-22 DIAGNOSIS — F332 Major depressive disorder, recurrent severe without psychotic features: Secondary | ICD-10-CM | POA: Diagnosis not present

## 2020-05-24 ENCOUNTER — Other Ambulatory Visit (HOSPITAL_COMMUNITY): Payer: Self-pay

## 2020-05-24 MED ORDER — ARIPIPRAZOLE 2 MG PO TABS
2.0000 mg | ORAL_TABLET | Freq: Every day | ORAL | 0 refills | Status: DC
  Filled 2020-05-24: qty 90, 90d supply, fill #0

## 2020-05-24 MED ORDER — SERTRALINE HCL 100 MG PO TABS
2.0000 | ORAL_TABLET | Freq: Every day | ORAL | 2 refills | Status: DC
  Filled 2020-05-24: qty 180, 90d supply, fill #0

## 2020-05-24 MED ORDER — BUPROPION HCL ER (XL) 300 MG PO TB24
1.0000 | ORAL_TABLET | Freq: Every day | ORAL | 2 refills | Status: DC
  Filled 2020-05-24: qty 90, 90d supply, fill #0

## 2020-06-12 ENCOUNTER — Other Ambulatory Visit (HOSPITAL_COMMUNITY): Payer: Self-pay

## 2020-06-12 MED ORDER — METHYLPHENIDATE HCL ER (OSM) 54 MG PO TBCR
54.0000 mg | EXTENDED_RELEASE_TABLET | Freq: Every day | ORAL | 0 refills | Status: DC
  Filled 2020-06-12: qty 30, 30d supply, fill #0

## 2020-06-12 MED FILL — Rosuvastatin Calcium Tab 5 MG: ORAL | 90 days supply | Qty: 90 | Fill #0 | Status: AC

## 2020-06-13 ENCOUNTER — Other Ambulatory Visit (HOSPITAL_COMMUNITY): Payer: Self-pay

## 2020-06-15 ENCOUNTER — Other Ambulatory Visit (HOSPITAL_COMMUNITY): Payer: Self-pay

## 2020-06-19 ENCOUNTER — Other Ambulatory Visit (HOSPITAL_COMMUNITY): Payer: Self-pay

## 2020-06-19 MED ORDER — DICLOFENAC SODIUM 75 MG PO TBEC
DELAYED_RELEASE_TABLET | ORAL | 0 refills | Status: DC
  Filled 2020-06-19: qty 90, 90d supply, fill #0

## 2020-06-21 ENCOUNTER — Other Ambulatory Visit (HOSPITAL_COMMUNITY): Payer: Self-pay

## 2020-07-09 ENCOUNTER — Other Ambulatory Visit (HOSPITAL_COMMUNITY): Payer: Self-pay

## 2020-07-09 MED ORDER — METHYLPHENIDATE HCL ER (OSM) 54 MG PO TBCR
54.0000 mg | EXTENDED_RELEASE_TABLET | Freq: Every day | ORAL | 0 refills | Status: DC
  Filled 2020-07-10: qty 30, 30d supply, fill #0

## 2020-07-10 ENCOUNTER — Other Ambulatory Visit (HOSPITAL_COMMUNITY): Payer: Self-pay

## 2020-07-18 DIAGNOSIS — E039 Hypothyroidism, unspecified: Secondary | ICD-10-CM | POA: Diagnosis not present

## 2020-07-18 DIAGNOSIS — E559 Vitamin D deficiency, unspecified: Secondary | ICD-10-CM | POA: Diagnosis not present

## 2020-07-18 DIAGNOSIS — Z0000 Encounter for general adult medical examination without abnormal findings: Secondary | ICD-10-CM | POA: Diagnosis not present

## 2020-07-18 DIAGNOSIS — E7800 Pure hypercholesterolemia, unspecified: Secondary | ICD-10-CM | POA: Diagnosis not present

## 2020-07-18 DIAGNOSIS — Z1211 Encounter for screening for malignant neoplasm of colon: Secondary | ICD-10-CM | POA: Diagnosis not present

## 2020-07-24 ENCOUNTER — Other Ambulatory Visit (HOSPITAL_COMMUNITY): Payer: Self-pay

## 2020-07-24 DIAGNOSIS — E7800 Pure hypercholesterolemia, unspecified: Secondary | ICD-10-CM | POA: Diagnosis not present

## 2020-07-24 DIAGNOSIS — R29898 Other symptoms and signs involving the musculoskeletal system: Secondary | ICD-10-CM | POA: Diagnosis not present

## 2020-07-24 DIAGNOSIS — Z0000 Encounter for general adult medical examination without abnormal findings: Secondary | ICD-10-CM | POA: Diagnosis not present

## 2020-07-24 DIAGNOSIS — Z23 Encounter for immunization: Secondary | ICD-10-CM | POA: Diagnosis not present

## 2020-07-24 DIAGNOSIS — E039 Hypothyroidism, unspecified: Secondary | ICD-10-CM | POA: Diagnosis not present

## 2020-07-24 DIAGNOSIS — G4484 Primary exertional headache: Secondary | ICD-10-CM | POA: Diagnosis not present

## 2020-07-24 DIAGNOSIS — Z1211 Encounter for screening for malignant neoplasm of colon: Secondary | ICD-10-CM | POA: Diagnosis not present

## 2020-07-26 ENCOUNTER — Other Ambulatory Visit (HOSPITAL_COMMUNITY): Payer: Self-pay

## 2020-07-27 ENCOUNTER — Other Ambulatory Visit (HOSPITAL_COMMUNITY): Payer: Self-pay

## 2020-07-30 ENCOUNTER — Other Ambulatory Visit (HOSPITAL_COMMUNITY): Payer: Self-pay

## 2020-07-31 ENCOUNTER — Other Ambulatory Visit (HOSPITAL_COMMUNITY): Payer: Self-pay

## 2020-07-31 DIAGNOSIS — F332 Major depressive disorder, recurrent severe without psychotic features: Secondary | ICD-10-CM | POA: Diagnosis not present

## 2020-07-31 DIAGNOSIS — F411 Generalized anxiety disorder: Secondary | ICD-10-CM | POA: Diagnosis not present

## 2020-07-31 MED ORDER — BUPROPION HCL ER (XL) 300 MG PO TB24
ORAL_TABLET | ORAL | 1 refills | Status: DC
  Filled 2020-07-31: qty 90, 90d supply, fill #0

## 2020-08-03 ENCOUNTER — Other Ambulatory Visit (HOSPITAL_COMMUNITY): Payer: Self-pay

## 2020-08-03 MED ORDER — METHYLPHENIDATE HCL ER (OSM) 54 MG PO TBCR
54.0000 mg | EXTENDED_RELEASE_TABLET | Freq: Every day | ORAL | 0 refills | Status: DC
  Filled 2020-08-14: qty 30, 30d supply, fill #0

## 2020-08-08 ENCOUNTER — Other Ambulatory Visit (HOSPITAL_COMMUNITY): Payer: Self-pay

## 2020-08-08 MED ORDER — CEPHALEXIN 500 MG PO CAPS
ORAL_CAPSULE | ORAL | 0 refills | Status: DC
  Filled 2020-08-08: qty 20, 10d supply, fill #0

## 2020-08-14 ENCOUNTER — Other Ambulatory Visit (HOSPITAL_COMMUNITY): Payer: Self-pay

## 2020-08-30 ENCOUNTER — Other Ambulatory Visit: Payer: Self-pay

## 2020-08-30 ENCOUNTER — Other Ambulatory Visit (HOSPITAL_COMMUNITY): Payer: Self-pay

## 2020-08-30 ENCOUNTER — Ambulatory Visit: Payer: 59 | Admitting: Sports Medicine

## 2020-08-30 VITALS — Ht 62.0 in | Wt 118.0 lb

## 2020-08-30 DIAGNOSIS — M25511 Pain in right shoulder: Secondary | ICD-10-CM | POA: Diagnosis not present

## 2020-08-30 MED ORDER — NITROGLYCERIN 0.2 MG/HR TD PT24
MEDICATED_PATCH | TRANSDERMAL | 1 refills | Status: DC
  Filled 2020-08-30: qty 7, 28d supply, fill #0

## 2020-08-30 NOTE — Patient Instructions (Addendum)
Thank you for coming to see me today. It was a pleasure. Today we talked about:   Below is the information for the nitroglycerin patches.  We will have ou use this and do the rotator cuff exercises.  Please follow-up with [place] in 6-8 weeks.  If you have any questions or concerns, please do not hesitate to call the office at [phone].  Best,   Ann Constable, DO [place]    Nitroglycerin Protocol  Apply 1/4 nitroglycerin patch to affected area daily. Change position of patch within the affected area every 24 hours. You may experience a headache during the first 1-2 weeks of using the patch, these should subside. If you experience headaches after beginning nitroglycerin patch treatment, you may take your preferred over the counter pain reliever. Another side effect of the nitroglycerin patch is skin irritation or rash related to patch adhesive. Please notify our office if you develop more severe headaches or rash, and stop the patch. Tendon healing with nitroglycerin patch may require 12 to 24 weeks depending on the extent of injury. Men should not use if taking Viagra, Cialis, or Levitra.  Do not use if you have migraines or rosacea.

## 2020-08-30 NOTE — Assessment & Plan Note (Signed)
Her exam and ultrasound findings are most consistent with a small tear in her supraspinatus.  While she does have some AC joint arthritis on ultrasound, this does not seem to be clinically relevant and is not a source of her pain on examination.  We will treat her with rotator cuff strengthening, advised modifying her exercises with her personal trainer to avoid strengthening exercises above her shoulders.  We will also have her perform Nitropatch protocol, no history of migraines.  She will follow-up in 6 to 8 weeks to assess her status.  Discussed with patient that given that she is not significantly weak, and based on ultrasound findings, she does not require surgery at this time.

## 2020-08-30 NOTE — Progress Notes (Signed)
Ann Mendez is a 60 y.o. 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 She states that 6 months ago she started having discomfort in her upper/lateral right arm States that she did formal physical therapy and massage, which helped a little bit, but would always come back She reports that over the last few months she has also noticed trigger points near her right scapula States that she is also had some new weakness and dropped to drink when she raised up in front of her out to dinner with her husband recently States that she works on a computer and sometimes gets a nagging sharp pain that shoots down the lateral side of her arm stopping mid upper arm States that she does have trouble finding a place of comfort while trying to sleep, but has not woken up when she rolls on it States that in the last few weeks to month she is also noticed that the right side of her neck starts to hurt off and on Denies any numbness and tingling States that she has worsening pain with overhead activity She denies any nagging pain in her posterior shoulder States that she has previously been seen by an orthopedic surgeon many years ago and 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  ROS as above. Medications reviewed.  Exam:  Ht '5\' 2"'$  (1.575 m)   Wt 118 lb (53.5 kg)   LMP 10/16/2011   BMI 21.58 kg/m  Gen: Well NAD MSK:  Right Shoulder: Inspection reveals rightly superior right shoulder, no atrophy, or asymmetry b/l. No bruising. No swelling TTP over BT, otherwise no TTP, specifically none over [facility] Joint. Full ROM in flexion, abduction, external rotation b/l.  IR limited in 5 degrees on right compared to left.  Pain at extremes of  IR, flexion, abduction on right NV intact distally b/l Normal scapular function observed b/l, no winging Special Tests:  - Impingement: Neg Hawkins - Supraspinatous: Pain and minimal weakness with empty can - Infraspinatous/Teres Minor: 5/5 strength with ER - Subscapularis: 5/5 strength with IR and some pain with resisted IR - Biceps tendon: Positive Speeds - Labrum: Negative Obriens, negative clunk, good stability, no sulcus sign - AC Joint: Negative cross arm - Negative apprehension test - No painful arc and no drop arm sign  Neck/Back: - Inspection: no gross deformity or asymmetry, swelling or ecchymosis - Palpation: no TTP  spinous process, TTP over right rhomboid and trapezius with pressure point at mid scapula level - ROM: full active ROM of the cervical spine with neck extension, rotation, flexion - no pain - Strength: per above - Neuro: sensation intact in the C5-C8 nerve root distribution b/l, 2+ C5-C7 reflexes - Special testing: negative spurling's   ULTRASOUND: Shoulder, Right  Diagnostic complete ultrasound imaging obtained of patient's right shoulder.  - No obvious evidence of bony deformity or osteophyte development appreciated.  - Long head of the biceps tendon: No evidence of tendon thickening, calcification, subluxation, or tearing in short or long axis views. She does have small hypoechoic collection surrounding proximal BT in long and short axis. - Pec major insertion visualized without abnormality. - Subscapularis tendon: complete visualization across the width of the  insertion point yielded no evidence of tendon thickening, calcification, or tears in the long axis view.  - Supraspinatus tendon: complete visualization across the width of the insertion point yielded small area of hypoechoic collection within tendon and few hyperechoic collections noted.  Endplate otherwise intact. No evidence of bursal inflammation appreciated.  - Infraspinatus and teres minor  tendons: visualization across the width of the insertion points yielded no evidence of tendon thickening, calcification, or tears in the long axis view.  [facility] Joint: with osteophytes noted and hypoechoic collection within joint extending superiorly, c/w mushroom sign.   - Posterior Glenohumeral Joint visualized without abnormality. IMPRESSION: findings consistent with small right supraspinatus tear, mild right biceps tendonitis, asymptomatic AC joint arthritis with effusion.  No results found.   Assessment and Plan: 1) Acute pain of right shoulder Her exam and ultrasound findings are most consistent with a small tear in her supraspinatus.  While she does have some AC joint arthritis on ultrasound, this does not seem to be clinically relevant and is not a source of her pain on examination.  We will treat her with rotator cuff strengthening, advised modifying her exercises with her personal trainer to avoid strengthening exercises above her shoulders.  We will also have her perform Nitropatch protocol, no history of migraines.  She will follow-up in 6 to 8 weeks to assess her status.  Discussed with patient that given that she is not significantly weak, and based on ultrasound findings, she does not require surgery at this time.   [place] Constable, D.O.  PGY-4 [facility]  08/30/2020 2:59 PM  Patient seen and evaluated with the sports medicine fellow.  I agree with the above plan of care.  Bedside ultrasound does show some hypoechoic changes in the midportion of the supraspinatus tendon consistent with a probable partial-thickness tear here.  We will start a Nitropatch protocol and she will start a home exercise program.  Follow-up again in 6 weeks for reevaluation and limited repeat ultrasound.  She will call with questions or concerns in the interim.

## 2020-09-07 ENCOUNTER — Other Ambulatory Visit (HOSPITAL_COMMUNITY): Payer: Self-pay

## 2020-09-07 MED ORDER — ROSUVASTATIN CALCIUM 5 MG PO TABS
ORAL_TABLET | ORAL | 3 refills | Status: DC
  Filled 2020-09-07: qty 90, 90d supply, fill #0
  Filled 2020-12-09: qty 90, 90d supply, fill #1
  Filled 2021-03-15: qty 90, 90d supply, fill #2
  Filled 2021-06-21: qty 90, 90d supply, fill #3

## 2020-09-10 ENCOUNTER — Other Ambulatory Visit (HOSPITAL_COMMUNITY): Payer: Self-pay

## 2020-09-10 MED ORDER — METHYLPHENIDATE HCL ER (OSM) 54 MG PO TBCR
EXTENDED_RELEASE_TABLET | Freq: Every day | ORAL | 0 refills | Status: DC
  Filled 2020-09-10: qty 30, 30d supply, fill #0

## 2020-09-10 MED ORDER — ARIPIPRAZOLE 2 MG PO TABS
ORAL_TABLET | ORAL | 1 refills | Status: DC
  Filled 2020-09-10: qty 90, 90d supply, fill #0
  Filled 2020-12-09: qty 90, 90d supply, fill #1

## 2020-09-10 MED ORDER — SERTRALINE HCL 100 MG PO TABS
ORAL_TABLET | ORAL | 1 refills | Status: DC
  Filled 2020-09-10 – 2020-10-23 (×2): qty 180, 90d supply, fill #0

## 2020-09-11 ENCOUNTER — Other Ambulatory Visit (HOSPITAL_COMMUNITY): Payer: Self-pay

## 2020-09-26 ENCOUNTER — Telehealth: Payer: Self-pay | Admitting: *Deleted

## 2020-09-26 NOTE — Telephone Encounter (Signed)
Patient called stating she found out she can get the estradiol vaginal cream (estrace) cheaper, then using the premarin vaginal cream (what she uses currently) Dr.Kendall prescribed the premarin vaginal cream insert 2 grams twice weekly.   Okay to send in estrace vaginal cream? Do you want her using 1 gram or 2 grams twice weekly. Patient will schedule annual exam with you in November.   Please advise

## 2020-09-27 ENCOUNTER — Other Ambulatory Visit (HOSPITAL_COMMUNITY): Payer: Self-pay

## 2020-09-27 MED ORDER — ESTRADIOL 0.1 MG/GM VA CREA
TOPICAL_CREAM | VAGINAL | 1 refills | Status: DC
  Filled 2020-09-27: qty 42.5, 30d supply, fill #0

## 2020-09-27 NOTE — Telephone Encounter (Signed)
Per Dr.Lavoie "Can prescribe Estrace cream 2 g, but inform patient that the smallest amount that helps her is best. "   Patient informed, Rx sent.

## 2020-10-02 ENCOUNTER — Other Ambulatory Visit (HOSPITAL_COMMUNITY): Payer: Self-pay

## 2020-10-02 DIAGNOSIS — F411 Generalized anxiety disorder: Secondary | ICD-10-CM | POA: Diagnosis not present

## 2020-10-02 MED ORDER — METHYLPHENIDATE HCL ER (OSM) 54 MG PO TBCR
EXTENDED_RELEASE_TABLET | Freq: Every day | ORAL | 0 refills | Status: DC
  Filled 2020-11-12: qty 30, 30d supply, fill #0

## 2020-10-02 MED ORDER — BUPROPION HCL ER (XL) 150 MG PO TB24
ORAL_TABLET | Freq: Every day | ORAL | 0 refills | Status: DC
  Filled 2020-10-02: qty 90, 90d supply, fill #0

## 2020-10-04 ENCOUNTER — Other Ambulatory Visit (HOSPITAL_COMMUNITY): Payer: Self-pay

## 2020-10-04 MED ORDER — CEPHALEXIN 500 MG PO CAPS
ORAL_CAPSULE | ORAL | 0 refills | Status: DC
  Filled 2020-10-04: qty 20, 10d supply, fill #0

## 2020-10-09 ENCOUNTER — Ambulatory Visit (INDEPENDENT_AMBULATORY_CARE_PROVIDER_SITE_OTHER): Payer: 59 | Admitting: Sports Medicine

## 2020-10-09 VITALS — Ht 62.0 in | Wt 118.0 lb

## 2020-10-09 DIAGNOSIS — M25511 Pain in right shoulder: Secondary | ICD-10-CM

## 2020-10-10 ENCOUNTER — Ambulatory Visit
Admission: RE | Admit: 2020-10-10 | Discharge: 2020-10-10 | Disposition: A | Discharge status: Home / Self Care | Payer: 59 | Source: Ambulatory Visit | Attending: Sports Medicine | Admitting: Sports Medicine

## 2020-10-10 ENCOUNTER — Other Ambulatory Visit: Payer: Self-pay

## 2020-10-10 DIAGNOSIS — S46011A Strain of muscle(s) and tendon(s) of the rotator cuff of right shoulder, initial encounter: Secondary | ICD-10-CM | POA: Diagnosis not present

## 2020-10-10 DIAGNOSIS — M25511 Pain in right shoulder: Secondary | ICD-10-CM

## 2020-10-10 NOTE — Progress Notes (Signed)
   Subjective:    Patient ID: Ann Mendez, female    DOB: Sep 19, 1960, 60 y.o.   MRN: [id number]  HPI  Ann Mendez presents today for follow-up on right shoulder pain.  Unfortunately, she has not noticed much improvement.  She still has lateral shoulder pain with activity as well as with sleeping at night.  She has also noticed weakness in this arm.  She has been diligent about her home exercises and using her topical nitroglycerin patch.  She has also avoided upper body exercises in the gym.  But despite this, she continues to have symptoms.    Review of Systems As above    Objective:   Physical Exam  Well-developed, well-nourished.  No acute distress  Right shoulder: Good range of motion but a positive painful arc.  Positive empty can, positive Hawkins.  No tenderness to palpation.  4+/5 strength with resisted supraspinatus and 4+/5 strength with resisted external rotation on the right compared to 5/5 on the left.  5/5 strength with resisted subscapularis bilaterally.  Neurovascularly intact distally.      Assessment & Plan:   Persistent right shoulder pain secondary to rotator cuff tear  Previous bedside ultrasound suggested a tear of the supraspinatus tendon.  Given her failure to improve with conservative treatment to date and her weakness on physical exam, I would like to proceed with an MRI to evaluate further.  Phone follow-up with those results when available.  Of note, she was asking about the possibility of trying some osteopathic manipulation on her shoulder with Dr. Paulla Fore.  We will revisit this idea after I review her MRI.  This note was dictated using Dragon naturally speaking software and may contain errors in syntax, spelling, or content which have not been identified prior to signing this note.

## 2020-10-12 ENCOUNTER — Other Ambulatory Visit (HOSPITAL_COMMUNITY): Payer: Self-pay

## 2020-10-12 MED ORDER — METHYLPHENIDATE HCL ER (OSM) 54 MG PO TBCR
54.0000 mg | EXTENDED_RELEASE_TABLET | Freq: Every day | ORAL | 0 refills | Status: DC
  Filled 2020-10-12: qty 30, 30d supply, fill #0

## 2020-10-17 ENCOUNTER — Telehealth: Payer: Self-pay | Admitting: Sports Medicine

## 2020-10-17 NOTE — Telephone Encounter (Signed)
Dr. [facility] [address], [place]  Appt: 10/19/20 @ 9:15 am

## 2020-10-17 NOTE — Telephone Encounter (Signed)
  I spoke with Ann Mendez on the phone today after reviewing the MRI of her right shoulder done on September 30.  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  There are small tears in both tendons as well.  Given these findings as well as her weakness on physical exam, I recommended consultation with Dr. Griffin Basil to discuss next steps in treatment.  She is in agreement with that plan.  She had also previously asked about seeing Dr. Paulla Fore for manipulation and I do not think that that will cause her any harm.  Follow-up with me as needed.

## 2020-10-22 ENCOUNTER — Other Ambulatory Visit (HOSPITAL_COMMUNITY): Payer: Self-pay

## 2020-10-22 ENCOUNTER — Ambulatory Visit: Payer: 59 | Admitting: Neurology

## 2020-10-23 ENCOUNTER — Other Ambulatory Visit (HOSPITAL_COMMUNITY): Payer: Self-pay

## 2020-10-23 DIAGNOSIS — M25511 Pain in right shoulder: Secondary | ICD-10-CM | POA: Diagnosis not present

## 2020-10-31 DIAGNOSIS — M9907 Segmental and somatic dysfunction of upper extremity: Secondary | ICD-10-CM | POA: Diagnosis not present

## 2020-10-31 DIAGNOSIS — M25511 Pain in right shoulder: Secondary | ICD-10-CM | POA: Diagnosis not present

## 2020-10-31 DIAGNOSIS — M9901 Segmental and somatic dysfunction of cervical region: Secondary | ICD-10-CM | POA: Diagnosis not present

## 2020-10-31 DIAGNOSIS — M9902 Segmental and somatic dysfunction of thoracic region: Secondary | ICD-10-CM | POA: Diagnosis not present

## 2020-10-31 DIAGNOSIS — M9908 Segmental and somatic dysfunction of rib cage: Secondary | ICD-10-CM | POA: Diagnosis not present

## 2020-11-12 ENCOUNTER — Other Ambulatory Visit (HOSPITAL_COMMUNITY): Payer: Self-pay

## 2020-11-13 DIAGNOSIS — E039 Hypothyroidism, unspecified: Secondary | ICD-10-CM | POA: Diagnosis not present

## 2020-11-14 DIAGNOSIS — M542 Cervicalgia: Secondary | ICD-10-CM | POA: Diagnosis not present

## 2020-11-14 DIAGNOSIS — M9901 Segmental and somatic dysfunction of cervical region: Secondary | ICD-10-CM | POA: Diagnosis not present

## 2020-11-14 DIAGNOSIS — M24511 Contracture, right shoulder: Secondary | ICD-10-CM | POA: Diagnosis not present

## 2020-11-14 DIAGNOSIS — M9902 Segmental and somatic dysfunction of thoracic region: Secondary | ICD-10-CM | POA: Diagnosis not present

## 2020-11-14 DIAGNOSIS — M9907 Segmental and somatic dysfunction of upper extremity: Secondary | ICD-10-CM | POA: Diagnosis not present

## 2020-11-14 DIAGNOSIS — M25511 Pain in right shoulder: Secondary | ICD-10-CM | POA: Diagnosis not present

## 2020-11-23 DIAGNOSIS — Z23 Encounter for immunization: Secondary | ICD-10-CM | POA: Diagnosis not present

## 2020-11-27 DIAGNOSIS — F411 Generalized anxiety disorder: Secondary | ICD-10-CM | POA: Diagnosis not present

## 2020-11-30 ENCOUNTER — Other Ambulatory Visit (HOSPITAL_COMMUNITY): Payer: Self-pay

## 2020-11-30 MED ORDER — METHYLPHENIDATE HCL ER (OSM) 54 MG PO TBCR
EXTENDED_RELEASE_TABLET | Freq: Every day | ORAL | 0 refills | Status: DC
  Filled 2020-12-10: qty 30, 30d supply, fill #0

## 2020-12-03 ENCOUNTER — Other Ambulatory Visit: Payer: Self-pay | Admitting: Obstetrics and Gynecology

## 2020-12-04 ENCOUNTER — Other Ambulatory Visit (HOSPITAL_COMMUNITY): Payer: Self-pay

## 2020-12-04 ENCOUNTER — Telehealth: Payer: Self-pay | Admitting: *Deleted

## 2020-12-04 DIAGNOSIS — M542 Cervicalgia: Secondary | ICD-10-CM | POA: Diagnosis not present

## 2020-12-04 DIAGNOSIS — M25521 Pain in right elbow: Secondary | ICD-10-CM | POA: Diagnosis not present

## 2020-12-04 DIAGNOSIS — M9907 Segmental and somatic dysfunction of upper extremity: Secondary | ICD-10-CM | POA: Diagnosis not present

## 2020-12-04 DIAGNOSIS — M9901 Segmental and somatic dysfunction of cervical region: Secondary | ICD-10-CM | POA: Diagnosis not present

## 2020-12-04 DIAGNOSIS — M25511 Pain in right shoulder: Secondary | ICD-10-CM | POA: Diagnosis not present

## 2020-12-04 DIAGNOSIS — M9908 Segmental and somatic dysfunction of rib cage: Secondary | ICD-10-CM | POA: Diagnosis not present

## 2020-12-04 MED ORDER — ACYCLOVIR 200 MG PO CAPS
200.0000 mg | ORAL_CAPSULE | Freq: Two times a day (BID) | ORAL | 1 refills | Status: DC
  Filled 2020-12-04: qty 60, 30d supply, fill #0

## 2020-12-04 NOTE — Telephone Encounter (Signed)
Patient called requesting refill on acyclovir 400 mg tablet, annual exam scheduled on 01/23/21. Rx sent.

## 2020-12-09 ENCOUNTER — Other Ambulatory Visit (HOSPITAL_COMMUNITY): Payer: Self-pay

## 2020-12-10 ENCOUNTER — Other Ambulatory Visit (HOSPITAL_COMMUNITY): Payer: Self-pay

## 2020-12-19 ENCOUNTER — Other Ambulatory Visit (HOSPITAL_COMMUNITY): Payer: Self-pay

## 2020-12-19 MED ORDER — DICLOFENAC SODIUM 75 MG PO TBEC
75.0000 mg | DELAYED_RELEASE_TABLET | Freq: Every day | ORAL | 0 refills | Status: DC
  Filled 2020-12-19: qty 90, 90d supply, fill #0

## 2020-12-27 ENCOUNTER — Other Ambulatory Visit (HOSPITAL_COMMUNITY): Payer: Self-pay

## 2020-12-28 ENCOUNTER — Other Ambulatory Visit (HOSPITAL_COMMUNITY): Payer: Self-pay

## 2020-12-31 ENCOUNTER — Other Ambulatory Visit (HOSPITAL_BASED_OUTPATIENT_CLINIC_OR_DEPARTMENT_OTHER): Payer: Self-pay

## 2020-12-31 ENCOUNTER — Ambulatory Visit: Payer: 59 | Attending: Internal Medicine

## 2020-12-31 ENCOUNTER — Other Ambulatory Visit (HOSPITAL_COMMUNITY): Payer: Self-pay

## 2020-12-31 ENCOUNTER — Other Ambulatory Visit: Payer: Self-pay

## 2020-12-31 DIAGNOSIS — Z23 Encounter for immunization: Secondary | ICD-10-CM

## 2020-12-31 MED ORDER — PFIZER COVID-19 VAC BIVALENT 30 MCG/0.3ML IM SUSP
INTRAMUSCULAR | 0 refills | Status: DC
  Filled 2020-12-31: qty 0.3, 1d supply, fill #0

## 2020-12-31 MED ORDER — BUPROPION HCL ER (XL) 150 MG PO TB24
ORAL_TABLET | Freq: Every day | ORAL | 5 refills | Status: DC
  Filled 2020-12-31: qty 90, 90d supply, fill #0
  Filled 2021-03-26: qty 90, 90d supply, fill #1
  Filled 2021-06-25: qty 90, 90d supply, fill #2
  Filled 2021-09-24: qty 90, 90d supply, fill #3
  Filled 2021-12-24: qty 90, 90d supply, fill #4

## 2020-12-31 MED ORDER — METHYLPHENIDATE HCL ER (OSM) 54 MG PO TBCR
EXTENDED_RELEASE_TABLET | Freq: Every day | ORAL | 0 refills | Status: DC
  Filled 2020-12-31: qty 30, 30d supply, fill #0

## 2020-12-31 NOTE — Progress Notes (Signed)
° °  Covid-19 Vaccination Clinic  Name:  Ann Mendez    MRN: [id number] DOB: April 28, 1960  12/31/2020  [name] was observed post Covid-19 immunization for 15 minutes without incident. She was provided with Vaccine Information Sheet and instruction to access the V-Safe system.   [name] was instructed to call 911 with any severe reactions post vaccine: Difficulty breathing  Swelling of face and throat  A fast heartbeat  A bad rash all over body  Dizziness and weakness   Immunizations Administered     Name Date Dose VIS Date Route   Pfizer Covid-19 Vaccine Bivalent Booster 12/31/2020  3:43 PM 0.3 mL 09/12/2020 Intramuscular   Manufacturer: [place]   Lot: [id number]   [place]: [id number]

## 2021-01-02 ENCOUNTER — Ambulatory Visit: Payer: 59 | Admitting: Obstetrics & Gynecology

## 2021-01-03 ENCOUNTER — Other Ambulatory Visit (HOSPITAL_COMMUNITY): Payer: Self-pay

## 2021-01-17 ENCOUNTER — Telehealth: Payer: Self-pay

## 2021-01-17 ENCOUNTER — Other Ambulatory Visit (HOSPITAL_COMMUNITY): Payer: Self-pay

## 2021-01-17 MED ORDER — ESTRADIOL 0.1 MG/GM VA CREA
TOPICAL_CREAM | VAGINAL | 0 refills | Status: DC
  Filled 2021-01-17: qty 42.5, 70d supply, fill #0
  Filled 2021-01-17: qty 42.5, 73d supply, fill #0

## 2021-01-17 NOTE — Telephone Encounter (Signed)
AEX 11/24/19 with Dr. Delilah Shan. Normal Mammo 04/01/20  Scheduled AEX 01/23/2021 with Dr. Marguerita Merles (we r/s'd her 12/22 appt.)

## 2021-01-18 ENCOUNTER — Other Ambulatory Visit (HOSPITAL_COMMUNITY): Payer: Self-pay

## 2021-01-23 ENCOUNTER — Other Ambulatory Visit: Payer: Self-pay

## 2021-01-23 ENCOUNTER — Other Ambulatory Visit (HOSPITAL_COMMUNITY)
Admission: RE | Admit: 2021-01-23 | Discharge: 2021-01-23 | Disposition: A | Discharge status: Home / Self Care | Payer: 59 | Source: Ambulatory Visit | Attending: Obstetrics & Gynecology | Admitting: Obstetrics & Gynecology

## 2021-01-23 ENCOUNTER — Encounter: Payer: Self-pay | Admitting: Obstetrics & Gynecology

## 2021-01-23 ENCOUNTER — Other Ambulatory Visit (HOSPITAL_COMMUNITY): Payer: Self-pay

## 2021-01-23 ENCOUNTER — Ambulatory Visit (INDEPENDENT_AMBULATORY_CARE_PROVIDER_SITE_OTHER): Payer: 59 | Admitting: Obstetrics & Gynecology

## 2021-01-23 VITALS — BP 140/82 | HR 72 | Resp 20 | Ht 61.42 in | Wt 117.6 lb

## 2021-01-23 DIAGNOSIS — N952 Postmenopausal atrophic vaginitis: Secondary | ICD-10-CM | POA: Diagnosis not present

## 2021-01-23 DIAGNOSIS — A609 Anogenital herpesviral infection, unspecified: Secondary | ICD-10-CM

## 2021-01-23 DIAGNOSIS — Z01419 Encounter for gynecological examination (general) (routine) without abnormal findings: Secondary | ICD-10-CM

## 2021-01-23 MED ORDER — ESTRADIOL 0.1 MG/GM VA CREA
TOPICAL_CREAM | VAGINAL | 4 refills | Status: DC
  Filled 2021-01-23: qty 42.5, fill #0
  Filled 2021-04-09: qty 42.5, 70d supply, fill #0
  Filled 2021-06-10: qty 42.5, 70d supply, fill #1
  Filled 2021-09-06: qty 42.5, 70d supply, fill #2
  Filled 2021-11-11: qty 42.5, 70d supply, fill #3
  Filled 2022-01-15: qty 42.5, 70d supply, fill #4

## 2021-01-23 MED ORDER — ACYCLOVIR 200 MG PO CAPS
200.0000 mg | ORAL_CAPSULE | Freq: Two times a day (BID) | ORAL | 4 refills | Status: DC
  Filled 2021-01-23: qty 60, 30d supply, fill #0

## 2021-01-23 NOTE — Progress Notes (Signed)
[facility] 19-Nov-1960 [id number]   History:    61 y.o. [id number]  RP:  Established patient presenting for annual gyn exam   HPI: Postmenopausal/atrophic genital changes. Would like to continue on Premarin cream twice weekly. She is not had any vaginal bleeding. No pelvic pain.  No pain with IC.  Pap smear /HPV Neg in 2019.  No significant history of abnormal Pap smears.  Breasts normal.  Mammogram 03/2020 Neg.  Colonoscopy 2020.  DEXA 11/2018 normal.  Next DEXA recommended 2025. BMI 21.92.  Health labs with Fam MD.   Past medical history,surgical history, family history and social history were all reviewed and documented in the EPIC chart.  Gynecologic History Patient's last menstrual period was 10/16/2011.  Obstetric History OB History  Gravida Para Term Preterm AB Living  5 1 1   4 2   SAB IAB Ectopic Multiple Live Births  4            # Outcome Date GA Lbr Len/2nd Weight Sex Delivery Anes PTL Lv  5 SAB           4 SAB           3 SAB           2 SAB           1 Term              ROS: A ROS was performed and pertinent positives and negatives are included in the history.  GENERAL: No fevers or chills. HEENT: No change in vision, no earache, sore throat or sinus congestion. NECK: No pain or stiffness. CARDIOVASCULAR: No chest pain or pressure. No palpitations. PULMONARY: No shortness of breath, cough or wheeze. GASTROINTESTINAL: No abdominal pain, nausea, vomiting or diarrhea, melena or bright red blood per rectum. GENITOURINARY: No urinary frequency, urgency, hesitancy or dysuria. MUSCULOSKELETAL: No joint or muscle pain, no back pain, no recent trauma. DERMATOLOGIC: No rash, no itching, no lesions. ENDOCRINE: No polyuria, polydipsia, no heat or cold intolerance. No recent change in weight. HEMATOLOGICAL: No anemia or easy bruising or bleeding. NEUROLOGIC: No headache, seizures, numbness, tingling or weakness. PSYCHIATRIC: No depression, no loss of interest in normal activity or  change in sleep pattern.     Exam:   BP 140/82 (BP Location: Right Arm)    Pulse 72    Resp 20    Ht 5' 1.42" (1.56 m)    Wt 117 lb 9.6 oz (53.3 kg)    LMP 10/16/2011    SpO2 99%    BMI 21.92 kg/m   Body mass index is 21.92 kg/m.  General appearance : Well developed well nourished female. No acute distress HEENT: Eyes: no retinal hemorrhage or exudates,  Neck supple, trachea midline, no carotid bruits, no thyroidmegaly Lungs: Clear to auscultation, no rhonchi or wheezes, or rib retractions  Heart: Regular rate and rhythm, no murmurs or gallops Breast:Examined in sitting and supine position were symmetrical in appearance, no palpable masses or tenderness,  no skin retraction, no nipple inversion, no nipple discharge, no skin discoloration, no axillary or supraclavicular lymphadenopathy Abdomen: no palpable masses or tenderness, no rebound or guarding Extremities: no edema or skin discoloration or tenderness  Pelvic: Vulva: Normal             Vagina: No gross lesions or discharge  Cervix: No gross lesions or discharge.  Pap reflex done.  Uterus  AV, normal size, shape and consistency, non-tender and mobile  Adnexa  Without  masses or tenderness  Anus: Normal   Assessment/Plan:  61 y.o. female for annual exam   1. Encounter for routine gynecological examination with Papanicolaou smear of cervix Postmenopausal/atrophic genital changes. Would like to continue on Estrace cream twice weekly. She is not had any vaginal bleeding. No pelvic pain.  No pain with IC.  Pap smear /HPV Neg in 2019.  No significant history of abnormal Pap smears. Pap reflex done today. Breasts normal.  Mammogram 03/2020 Neg.  Colonoscopy 2020.  DEXA 11/2018 normal.  Next DEXA recommended 2025. BMI 21.92.  Health labs with Fam MD.  - Cytology - PAP( [place] [place])  2. Postmenopausal atrophic vaginitis Postmenopausal/atrophic genital changes. Would like to continue on Estrace cream twice weekly. She is not had any  vaginal bleeding. No pelvic pain.  No pain with IC.  Estrace cream prescription sent to pharmacy.  3. HSV (herpes simplex virus) anogenital infection Will take Acyclovir for recurrences.  Prescription sent to pharmacy.  Other orders - acyclovir (ZOVIRAX) 200 MG capsule; Take 1 capsule (200 mg total) by mouth 2 (two) times daily. - estradiol (ESTRACE VAGINAL) 0.1 MG/GM vaginal cream; Insert 2 grams vaginally twice weekly.   Princess Bruins MD, 10:24 AM 01/23/2021

## 2021-01-24 LAB — CYTOLOGY - PAP: Diagnosis: NEGATIVE

## 2021-01-26 ENCOUNTER — Other Ambulatory Visit (HOSPITAL_COMMUNITY): Payer: Self-pay

## 2021-01-26 MED ORDER — SERTRALINE HCL 100 MG PO TABS
ORAL_TABLET | ORAL | 3 refills | Status: DC
  Filled 2021-01-26: qty 180, 90d supply, fill #0
  Filled 2021-04-18: qty 180, 90d supply, fill #1
  Filled 2021-07-22: qty 180, 90d supply, fill #2
  Filled 2021-10-19: qty 180, 90d supply, fill #3

## 2021-01-28 DIAGNOSIS — H5212 Myopia, left eye: Secondary | ICD-10-CM | POA: Diagnosis not present

## 2021-01-31 DIAGNOSIS — F411 Generalized anxiety disorder: Secondary | ICD-10-CM | POA: Diagnosis not present

## 2021-02-06 ENCOUNTER — Other Ambulatory Visit (HOSPITAL_COMMUNITY): Payer: Self-pay

## 2021-02-06 MED ORDER — METHYLPHENIDATE HCL ER (OSM) 36 MG PO TBCR
EXTENDED_RELEASE_TABLET | Freq: Every day | ORAL | 0 refills | Status: DC
  Filled 2021-02-06: qty 30, 30d supply, fill #0

## 2021-02-28 ENCOUNTER — Other Ambulatory Visit (HOSPITAL_COMMUNITY): Payer: Self-pay

## 2021-03-12 ENCOUNTER — Other Ambulatory Visit (HOSPITAL_COMMUNITY): Payer: Self-pay

## 2021-03-13 ENCOUNTER — Other Ambulatory Visit (HOSPITAL_COMMUNITY): Payer: Self-pay

## 2021-03-13 MED ORDER — METHYLPHENIDATE HCL ER (OSM) 36 MG PO TBCR
36.0000 mg | EXTENDED_RELEASE_TABLET | Freq: Every day | ORAL | 0 refills | Status: DC
  Filled 2021-03-13: qty 30, 30d supply, fill #0

## 2021-03-13 MED ORDER — ARIPIPRAZOLE 2 MG PO TABS
ORAL_TABLET | ORAL | 3 refills | Status: DC
  Filled 2021-03-13: qty 85, 85d supply, fill #0
  Filled 2021-03-13: qty 5, 5d supply, fill #0

## 2021-03-14 ENCOUNTER — Other Ambulatory Visit (HOSPITAL_COMMUNITY): Payer: Self-pay

## 2021-03-15 ENCOUNTER — Other Ambulatory Visit (HOSPITAL_COMMUNITY): Payer: Self-pay

## 2021-03-27 ENCOUNTER — Other Ambulatory Visit (HOSPITAL_COMMUNITY): Payer: Self-pay

## 2021-04-04 ENCOUNTER — Other Ambulatory Visit (HOSPITAL_COMMUNITY): Payer: Self-pay

## 2021-04-04 DIAGNOSIS — F411 Generalized anxiety disorder: Secondary | ICD-10-CM | POA: Diagnosis not present

## 2021-04-04 MED ORDER — SERTRALINE HCL 100 MG PO TABS
ORAL_TABLET | ORAL | 4 refills | Status: DC
  Filled 2021-04-04: qty 180, 90d supply, fill #0

## 2021-04-04 MED ORDER — METHYLPHENIDATE HCL ER (OSM) 36 MG PO TBCR
36.0000 mg | EXTENDED_RELEASE_TABLET | Freq: Every day | ORAL | 0 refills | Status: DC
  Filled 2021-04-04: qty 30, 30d supply, fill #0

## 2021-04-06 ENCOUNTER — Other Ambulatory Visit (HOSPITAL_COMMUNITY): Payer: Self-pay

## 2021-04-09 ENCOUNTER — Other Ambulatory Visit (HOSPITAL_COMMUNITY): Payer: Self-pay

## 2021-04-18 ENCOUNTER — Other Ambulatory Visit (HOSPITAL_COMMUNITY): Payer: Self-pay

## 2021-04-18 DIAGNOSIS — Z1231 Encounter for screening mammogram for malignant neoplasm of breast: Secondary | ICD-10-CM | POA: Diagnosis not present

## 2021-04-19 ENCOUNTER — Other Ambulatory Visit (HOSPITAL_COMMUNITY): Payer: Self-pay

## 2021-04-22 ENCOUNTER — Encounter: Payer: Self-pay | Admitting: Obstetrics & Gynecology

## 2021-04-23 ENCOUNTER — Encounter: Payer: Self-pay | Admitting: Obstetrics & Gynecology

## 2021-04-25 ENCOUNTER — Other Ambulatory Visit (HOSPITAL_COMMUNITY): Payer: Self-pay

## 2021-04-25 DIAGNOSIS — Z20822 Contact with and (suspected) exposure to covid-19: Secondary | ICD-10-CM | POA: Diagnosis not present

## 2021-04-25 DIAGNOSIS — E039 Hypothyroidism, unspecified: Secondary | ICD-10-CM | POA: Diagnosis not present

## 2021-04-25 DIAGNOSIS — R5383 Other fatigue: Secondary | ICD-10-CM | POA: Diagnosis not present

## 2021-04-25 DIAGNOSIS — R6883 Chills (without fever): Secondary | ICD-10-CM | POA: Diagnosis not present

## 2021-04-25 MED ORDER — LEVOTHYROXINE SODIUM 25 MCG PO TABS
ORAL_TABLET | ORAL | 1 refills | Status: DC
  Filled 2021-04-25: qty 30, 30d supply, fill #0
  Filled 2021-05-23: qty 30, 30d supply, fill #1

## 2021-04-30 ENCOUNTER — Other Ambulatory Visit (HOSPITAL_COMMUNITY): Payer: Self-pay

## 2021-04-30 MED ORDER — DICLOFENAC SODIUM 75 MG PO TBEC
75.0000 mg | DELAYED_RELEASE_TABLET | Freq: Every day | ORAL | 1 refills | Status: DC
  Filled 2021-04-30: qty 90, 90d supply, fill #0
  Filled 2021-11-01: qty 90, 90d supply, fill #1

## 2021-05-01 ENCOUNTER — Other Ambulatory Visit (HOSPITAL_COMMUNITY): Payer: Self-pay

## 2021-05-14 ENCOUNTER — Other Ambulatory Visit (HOSPITAL_COMMUNITY): Payer: Self-pay

## 2021-05-14 MED ORDER — METHYLPHENIDATE HCL ER (OSM) 36 MG PO TBCR
36.0000 mg | EXTENDED_RELEASE_TABLET | Freq: Every day | ORAL | 0 refills | Status: DC
  Filled 2021-05-14: qty 30, 30d supply, fill #0

## 2021-05-23 ENCOUNTER — Other Ambulatory Visit (HOSPITAL_COMMUNITY): Payer: Self-pay

## 2021-06-05 DIAGNOSIS — E039 Hypothyroidism, unspecified: Secondary | ICD-10-CM | POA: Diagnosis not present

## 2021-06-07 ENCOUNTER — Other Ambulatory Visit (HOSPITAL_COMMUNITY): Payer: Self-pay

## 2021-06-07 DIAGNOSIS — F411 Generalized anxiety disorder: Secondary | ICD-10-CM | POA: Diagnosis not present

## 2021-06-07 MED ORDER — METHYLPHENIDATE HCL ER (OSM) 36 MG PO TBCR
36.0000 mg | EXTENDED_RELEASE_TABLET | Freq: Every day | ORAL | 0 refills | Status: DC
  Filled 2021-06-07: qty 30, 30d supply, fill #0

## 2021-06-11 ENCOUNTER — Other Ambulatory Visit (HOSPITAL_COMMUNITY): Payer: Self-pay

## 2021-06-21 ENCOUNTER — Other Ambulatory Visit (HOSPITAL_COMMUNITY): Payer: Self-pay

## 2021-06-25 ENCOUNTER — Other Ambulatory Visit (HOSPITAL_COMMUNITY): Payer: Self-pay

## 2021-06-26 ENCOUNTER — Other Ambulatory Visit (HOSPITAL_COMMUNITY): Payer: Self-pay

## 2021-06-26 MED ORDER — LEVOTHYROXINE SODIUM 25 MCG PO TABS: ORAL_TABLET | ORAL | 1 refills | Status: DC

## 2021-06-26 MED ORDER — LEVOTHYROXINE SODIUM 25 MCG PO TABS
ORAL_TABLET | ORAL | 3 refills | Status: DC
  Filled 2021-06-26: qty 90, 90d supply, fill #0
  Filled 2021-09-20: qty 90, 90d supply, fill #1
  Filled 2021-12-24: qty 90, 90d supply, fill #2
  Filled 2022-03-17: qty 90, 90d supply, fill #3

## 2021-07-08 ENCOUNTER — Other Ambulatory Visit (HOSPITAL_COMMUNITY): Payer: Self-pay

## 2021-07-09 ENCOUNTER — Other Ambulatory Visit (HOSPITAL_COMMUNITY): Payer: Self-pay

## 2021-07-10 ENCOUNTER — Other Ambulatory Visit (HOSPITAL_COMMUNITY): Payer: Self-pay

## 2021-07-10 MED ORDER — METHYLPHENIDATE HCL ER (OSM) 36 MG PO TBCR
36.0000 mg | EXTENDED_RELEASE_TABLET | Freq: Every day | ORAL | 0 refills | Status: DC
  Filled 2021-07-10: qty 30, 30d supply, fill #0

## 2021-07-22 ENCOUNTER — Other Ambulatory Visit (HOSPITAL_COMMUNITY): Payer: Self-pay

## 2021-07-30 DIAGNOSIS — E559 Vitamin D deficiency, unspecified: Secondary | ICD-10-CM | POA: Diagnosis not present

## 2021-07-30 DIAGNOSIS — E7800 Pure hypercholesterolemia, unspecified: Secondary | ICD-10-CM | POA: Diagnosis not present

## 2021-08-02 DIAGNOSIS — F411 Generalized anxiety disorder: Secondary | ICD-10-CM | POA: Diagnosis not present

## 2021-08-05 ENCOUNTER — Other Ambulatory Visit (HOSPITAL_COMMUNITY): Payer: Self-pay | Admitting: Family Medicine

## 2021-08-05 ENCOUNTER — Other Ambulatory Visit: Payer: Self-pay | Admitting: Family Medicine

## 2021-08-05 DIAGNOSIS — E559 Vitamin D deficiency, unspecified: Secondary | ICD-10-CM | POA: Diagnosis not present

## 2021-08-05 DIAGNOSIS — Z1211 Encounter for screening for malignant neoplasm of colon: Secondary | ICD-10-CM | POA: Diagnosis not present

## 2021-08-05 DIAGNOSIS — E7800 Pure hypercholesterolemia, unspecified: Secondary | ICD-10-CM

## 2021-08-05 DIAGNOSIS — F418 Other specified anxiety disorders: Secondary | ICD-10-CM | POA: Diagnosis not present

## 2021-08-05 DIAGNOSIS — E039 Hypothyroidism, unspecified: Secondary | ICD-10-CM | POA: Diagnosis not present

## 2021-08-05 DIAGNOSIS — Z0000 Encounter for general adult medical examination without abnormal findings: Secondary | ICD-10-CM | POA: Diagnosis not present

## 2021-08-06 ENCOUNTER — Other Ambulatory Visit (HOSPITAL_COMMUNITY): Payer: Self-pay

## 2021-08-06 MED ORDER — METHYLPHENIDATE HCL ER (OSM) 36 MG PO TBCR
36.0000 mg | EXTENDED_RELEASE_TABLET | Freq: Every day | ORAL | 0 refills | Status: DC
  Filled 2021-08-08: qty 30, 30d supply, fill #0

## 2021-08-06 MED ORDER — ARIPIPRAZOLE 2 MG PO TABS
ORAL_TABLET | ORAL | 1 refills | Status: DC
  Filled 2021-08-06: qty 23, 90d supply, fill #0

## 2021-08-08 ENCOUNTER — Other Ambulatory Visit (HOSPITAL_COMMUNITY): Payer: Self-pay

## 2021-08-15 ENCOUNTER — Other Ambulatory Visit (HOSPITAL_COMMUNITY): Payer: Self-pay

## 2021-08-16 ENCOUNTER — Other Ambulatory Visit (HOSPITAL_COMMUNITY): Payer: Self-pay

## 2021-08-16 MED ORDER — SODIUM CHLORIDE 0.9 % IN NEBU
INHALATION_SOLUTION | RESPIRATORY_TRACT | 13 refills | Status: DC
  Filled 2021-08-16: qty 300, 90d supply, fill #0

## 2021-08-19 ENCOUNTER — Other Ambulatory Visit (HOSPITAL_COMMUNITY): Payer: Self-pay

## 2021-08-20 ENCOUNTER — Other Ambulatory Visit (HOSPITAL_COMMUNITY): Payer: Self-pay

## 2021-08-27 ENCOUNTER — Ambulatory Visit (HOSPITAL_COMMUNITY): Payer: 59

## 2021-08-27 ENCOUNTER — Encounter (HOSPITAL_COMMUNITY): Payer: Self-pay

## 2021-09-06 ENCOUNTER — Other Ambulatory Visit (HOSPITAL_COMMUNITY): Payer: Self-pay

## 2021-09-10 ENCOUNTER — Other Ambulatory Visit (HOSPITAL_COMMUNITY): Payer: Self-pay

## 2021-09-10 MED ORDER — METHYLPHENIDATE HCL ER (OSM) 36 MG PO TBCR
36.0000 mg | EXTENDED_RELEASE_TABLET | Freq: Every day | ORAL | 0 refills | Status: DC
  Filled 2021-09-10: qty 30, 30d supply, fill #0

## 2021-09-18 ENCOUNTER — Other Ambulatory Visit (HOSPITAL_COMMUNITY): Payer: Self-pay

## 2021-09-20 ENCOUNTER — Other Ambulatory Visit (HOSPITAL_COMMUNITY): Payer: Self-pay

## 2021-09-20 MED ORDER — ROSUVASTATIN CALCIUM 5 MG PO TABS
5.0000 mg | ORAL_TABLET | Freq: Every day | ORAL | 3 refills | Status: DC
  Filled 2021-09-20: qty 90, 90d supply, fill #0

## 2021-09-24 ENCOUNTER — Other Ambulatory Visit (HOSPITAL_COMMUNITY): Payer: Self-pay

## 2021-09-27 ENCOUNTER — Other Ambulatory Visit (HOSPITAL_COMMUNITY): Payer: Self-pay

## 2021-10-02 DIAGNOSIS — F411 Generalized anxiety disorder: Secondary | ICD-10-CM | POA: Diagnosis not present

## 2021-10-03 ENCOUNTER — Other Ambulatory Visit (HOSPITAL_COMMUNITY): Payer: Self-pay

## 2021-10-03 MED ORDER — METHYLPHENIDATE HCL ER (OSM) 36 MG PO TBCR
36.0000 mg | EXTENDED_RELEASE_TABLET | Freq: Every day | ORAL | 0 refills | Status: DC
  Filled 2021-10-10: qty 30, 30d supply, fill #0

## 2021-10-07 DIAGNOSIS — M9906 Segmental and somatic dysfunction of lower extremity: Secondary | ICD-10-CM | POA: Diagnosis not present

## 2021-10-07 DIAGNOSIS — M9903 Segmental and somatic dysfunction of lumbar region: Secondary | ICD-10-CM | POA: Diagnosis not present

## 2021-10-07 DIAGNOSIS — M25551 Pain in right hip: Secondary | ICD-10-CM | POA: Diagnosis not present

## 2021-10-07 DIAGNOSIS — M9904 Segmental and somatic dysfunction of sacral region: Secondary | ICD-10-CM | POA: Diagnosis not present

## 2021-10-07 DIAGNOSIS — M9905 Segmental and somatic dysfunction of pelvic region: Secondary | ICD-10-CM | POA: Diagnosis not present

## 2021-10-09 ENCOUNTER — Other Ambulatory Visit (HOSPITAL_COMMUNITY): Payer: Self-pay

## 2021-10-10 ENCOUNTER — Other Ambulatory Visit (HOSPITAL_COMMUNITY): Payer: Self-pay

## 2021-10-11 DIAGNOSIS — L821 Other seborrheic keratosis: Secondary | ICD-10-CM | POA: Diagnosis not present

## 2021-10-11 DIAGNOSIS — Z1283 Encounter for screening for malignant neoplasm of skin: Secondary | ICD-10-CM | POA: Diagnosis not present

## 2021-10-11 DIAGNOSIS — B078 Other viral warts: Secondary | ICD-10-CM | POA: Diagnosis not present

## 2021-10-19 ENCOUNTER — Other Ambulatory Visit (HOSPITAL_COMMUNITY): Payer: Self-pay

## 2021-10-22 ENCOUNTER — Other Ambulatory Visit (HOSPITAL_COMMUNITY): Payer: Self-pay

## 2021-10-22 DIAGNOSIS — F411 Generalized anxiety disorder: Secondary | ICD-10-CM | POA: Diagnosis not present

## 2021-10-22 MED ORDER — METHYLPHENIDATE HCL ER (OSM) 36 MG PO TBCR
36.0000 mg | EXTENDED_RELEASE_TABLET | Freq: Every day | ORAL | 0 refills | Status: DC
  Filled 2021-11-06 – 2021-11-11 (×2): qty 30, 30d supply, fill #0

## 2021-10-23 ENCOUNTER — Other Ambulatory Visit (HOSPITAL_COMMUNITY): Payer: Self-pay

## 2021-11-01 ENCOUNTER — Other Ambulatory Visit (HOSPITAL_COMMUNITY): Payer: Self-pay

## 2021-11-04 ENCOUNTER — Other Ambulatory Visit (HOSPITAL_COMMUNITY): Payer: Self-pay

## 2021-11-04 DIAGNOSIS — B349 Viral infection, unspecified: Secondary | ICD-10-CM | POA: Diagnosis not present

## 2021-11-04 DIAGNOSIS — Z6821 Body mass index (BMI) 21.0-21.9, adult: Secondary | ICD-10-CM | POA: Diagnosis not present

## 2021-11-04 MED ORDER — IPRATROPIUM BROMIDE 0.06 % NA SOLN
2.0000 | Freq: Three times a day (TID) | NASAL | 0 refills | Status: DC
  Filled 2021-11-04: qty 15, 25d supply, fill #0

## 2021-11-06 ENCOUNTER — Other Ambulatory Visit (HOSPITAL_COMMUNITY): Payer: Self-pay

## 2021-11-11 ENCOUNTER — Other Ambulatory Visit (HOSPITAL_COMMUNITY): Payer: Self-pay

## 2021-11-12 ENCOUNTER — Other Ambulatory Visit (HOSPITAL_COMMUNITY): Payer: Self-pay

## 2021-11-27 ENCOUNTER — Other Ambulatory Visit (HOSPITAL_COMMUNITY): Payer: Self-pay

## 2021-11-27 DIAGNOSIS — F5101 Primary insomnia: Secondary | ICD-10-CM | POA: Diagnosis not present

## 2021-11-27 DIAGNOSIS — F332 Major depressive disorder, recurrent severe without psychotic features: Secondary | ICD-10-CM | POA: Diagnosis not present

## 2021-11-27 DIAGNOSIS — F411 Generalized anxiety disorder: Secondary | ICD-10-CM | POA: Diagnosis not present

## 2021-11-27 MED ORDER — METHYLPHENIDATE HCL ER (OSM) 36 MG PO TBCR
36.0000 mg | EXTENDED_RELEASE_TABLET | Freq: Every day | ORAL | 0 refills | Status: DC
  Filled 2021-12-09 – 2021-12-11 (×2): qty 30, 30d supply, fill #0

## 2021-11-28 ENCOUNTER — Encounter: Payer: Self-pay | Admitting: Gastroenterology

## 2021-12-09 ENCOUNTER — Other Ambulatory Visit (HOSPITAL_COMMUNITY): Payer: Self-pay

## 2021-12-11 ENCOUNTER — Other Ambulatory Visit (HOSPITAL_COMMUNITY): Payer: Self-pay

## 2021-12-16 ENCOUNTER — Other Ambulatory Visit (HOSPITAL_COMMUNITY): Payer: Self-pay

## 2021-12-16 MED ORDER — ROSUVASTATIN CALCIUM 5 MG PO TABS
5.0000 mg | ORAL_TABLET | Freq: Every day | ORAL | 4 refills | Status: DC
  Filled 2021-12-16: qty 90, 90d supply, fill #0
  Filled 2022-03-17: qty 90, 90d supply, fill #1
  Filled 2022-07-10: qty 90, 90d supply, fill #2

## 2021-12-18 ENCOUNTER — Encounter: Payer: Self-pay | Admitting: Family Medicine

## 2021-12-18 ENCOUNTER — Ambulatory Visit: Payer: 59 | Admitting: Family Medicine

## 2021-12-18 VITALS — BP 136/76 | Ht 62.0 in | Wt 115.0 lb

## 2021-12-18 DIAGNOSIS — M25562 Pain in left knee: Secondary | ICD-10-CM

## 2021-12-18 NOTE — Progress Notes (Signed)
PCP: Marda Stalker, PA-C  Subjective:   HPI: Patient is a 61 y.o. female here for left knee pain.  Patient reports that she is stood for a couple hours at a Christmas parade on Saturday. No acute injury or trauma. She reports posterior left knee pain ever since this time. However, today this feels better. She and her husband looked at this and it felt like it was more swollen on the posterior aspect of the left knee than the right. No catching, locking, giving out.   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 60 capsule 4   ARIPiprazole (ABILIFY) 2 MG tablet TAKE 1 TABLET BY MOUTH AT BEDTIME 90 tablet 0   ARIPiprazole (ABILIFY) 2 MG tablet Take one tablet by mouth at night 90 tablet 3   ARIPiprazole (ABILIFY) 2 MG tablet Take 1/4 tablet by mouth nightly 23 tablet 1   b complex vitamins tablet Take 1 tablet by mouth daily.     buPROPion (WELLBUTRIN XL) 150 MG 24 hr tablet Take 1 tablet by mouth once daily 90 tablet 5   cholecalciferol (VITAMIN D) 1000 UNITS tablet Take 2,000 Units by mouth daily.      COVID-19 mRNA bivalent vaccine, Pfizer, (PFIZER COVID-19 VAC BIVALENT) injection Inject into the muscle. 0.3 mL 0   diclofenac (VOLTAREN) 75 MG EC tablet TAKE 1 TABLET BY MOUTH ONCE DAILY 90 tablet 0   diclofenac (VOLTAREN) 75 MG EC tablet TAKE 1 TABLET BY MOUTH ONCE DAILY 90 tablet 1   estradiol (ESTRACE VAGINAL) 0.1 MG/GM vaginal cream Insert 2 grams vaginally twice weekly. 42.5 g 4   ipratropium (ATROVENT) 0.06 % nasal spray Place 2 sprays into the nose 3 (three) times  daily. 15 mL 0   levothyroxine (SYNTHROID) 25 MCG tablet Take 1 tablet by mouth in the morning on an empty stomach 90 tablet 3   levothyroxine (SYNTHROID) 25 MCG tablet Take 1 tablet by mouth in the morning on an empty stomach 90 tablet 1   methylphenidate (CONCERTA) 54 MG PO CR tablet Take 1 tablet (54 mg total) by mouth daily. 30 tablet 0   methylphenidate (CONCERTA) 54 MG PO CR tablet Take 1 tablet by mouth once daily 30 tablet 0   methylphenidate (CONCERTA) 54 MG PO CR tablet Take 1 tablet (54 mg total) by mouth daily. 30 tablet 0   methylphenidate (CONCERTA) 54 MG PO CR tablet Take 1 tablet by mouth daily. 30 tablet 0   methylphenidate 36 MG PO CR tablet Take 1 tablet (36 mg total) by mouth daily. 30 tablet 0   rosuvastatin (CRESTOR) 5 MG tablet Take 1 tablet by mouth once a day 90 tablet 3   rosuvastatin (CRESTOR) 5 MG tablet Take 1 tablet (5 mg total) by mouth daily. 90 tablet 4   sertraline (ZOLOFT) 100 MG tablet Take 2 tablets (200 mg total) by mouth daily. 180 tablet 2   sertraline (ZOLOFT) 100 MG tablet Take 2 tablets ('200mg'$ ) by mouth once daily 180 tablet 3  sertraline (ZOLOFT) 100 MG tablet Take 2 tablets by mouth daily 180 tablet 4   sodium chloride 0.9 % nebulizer solution Use as directed 90 mL 13   No current facility-administered medications on file prior to visit.    Past Surgical History:  Procedure Laterality Date   CATARACT EXTRACTION W/ INTRAOCULAR LENS IMPLANT Right 03/2015   CESAREAN SECTION  09/23/2000   COLONOSCOPY     KNEE ARTHROSCOPY Right 01/01/2017   Procedure: RIGHT KNEE ARTHROSCOPY, CHONDROPLASTY AND PARTIAL MEDIAL LATERAL MENISCECTOMY;  Surgeon: Marchia Bond, MD;  Location: [facility];  Service: Orthopedics;  Laterality: Right;   SKIN CANCER EXCISION     TUBAL LIGATION  09/23/2000    Allergies  Allergen Reactions   Stadol [Butorphanol] Other (See Comments)    Patient reports received in 1998, increased symptoms of HELLP   Sulfa  Antibiotics Other (See Comments)    UNKNOWN    BP 136/76   Ht '5\' 2"'$  (1.575 m)   Wt 115 lb (52.2 kg)   LMP 10/16/2011   BMI 21.03 kg/m      10/09/2020    1:29 PM  [place] Adult Exercise  Frequency of aerobic exercise (# of days/week) 4  Average time in minutes 50  Frequency of strengthening activities (# of days/week) 3        No data to display              Objective:  Physical Exam:  Gen: NAD, comfortable in exam room  Left knee: Mild fullness posterior left knee. No other gross deformity, ecchymoses, swelling. No TTP. FROM with normal strength. Negative ant/post drawers. Negative valgus/varus testing. Negative lachman.  Negative mcmurrays, apleys.  NV intact distally.   Limited musculoskeletal ultrasound left knee: Popliteal vein compressible.  Small Baker's cyst visible between medial gastrocnemius and semimembranosus tendon. Assessment & Plan:  1.  Left knee pain: Secondary to Baker's cyst.  Patient was reassured.  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  Advise she can take this twice a day if needed if the knee is bothering her enough.  Icing, compression, elevation.  Follow-up as needed.

## 2021-12-18 NOTE — Patient Instructions (Signed)
You have a baker's cyst behind your knee. Take the diclofenac as you have been - if the knee bothers you enough you can take this medicine twice a day. Icing 15 minutes at a time as needed. Elevate as needed to help with swelling. Compression sleeve will help keep this down also when you're up and walking around. If very bothersome these can be drained but I would try to avoid this if you can. Follow up with me as needed.

## 2021-12-24 ENCOUNTER — Other Ambulatory Visit (HOSPITAL_COMMUNITY): Payer: Self-pay

## 2022-01-07 DIAGNOSIS — M25562 Pain in left knee: Secondary | ICD-10-CM | POA: Diagnosis not present

## 2022-01-09 ENCOUNTER — Other Ambulatory Visit (HOSPITAL_COMMUNITY): Payer: Self-pay

## 2022-01-09 ENCOUNTER — Other Ambulatory Visit: Payer: Self-pay

## 2022-01-09 MED ORDER — METHYLPHENIDATE HCL ER (OSM) 36 MG PO TBCR
36.0000 mg | EXTENDED_RELEASE_TABLET | Freq: Every day | ORAL | 0 refills | Status: DC
  Filled 2022-01-09: qty 30, 30d supply, fill #0

## 2022-01-10 ENCOUNTER — Other Ambulatory Visit (HOSPITAL_COMMUNITY): Payer: Self-pay

## 2022-01-14 ENCOUNTER — Other Ambulatory Visit (HOSPITAL_COMMUNITY): Payer: Self-pay

## 2022-01-15 ENCOUNTER — Other Ambulatory Visit (HOSPITAL_COMMUNITY): Payer: Self-pay

## 2022-01-16 ENCOUNTER — Other Ambulatory Visit: Payer: Self-pay

## 2022-01-20 ENCOUNTER — Other Ambulatory Visit: Payer: Self-pay

## 2022-01-20 ENCOUNTER — Other Ambulatory Visit (HOSPITAL_COMMUNITY): Payer: Self-pay

## 2022-01-20 MED ORDER — SERTRALINE HCL 100 MG PO TABS
200.0000 mg | ORAL_TABLET | Freq: Every day | ORAL | 5 refills | Status: DC
  Filled 2022-01-20: qty 180, 90d supply, fill #0
  Filled 2022-04-19: qty 180, 90d supply, fill #1
  Filled 2022-07-18: qty 180, 90d supply, fill #2
  Filled 2022-10-21: qty 180, 90d supply, fill #3
  Filled 2023-01-15: qty 180, 90d supply, fill #4

## 2022-01-24 ENCOUNTER — Ambulatory Visit (HOSPITAL_COMMUNITY)
Admission: RE | Admit: 2022-01-24 | Discharge: 2022-01-24 | Disposition: A | Discharge status: Home / Self Care | Payer: Commercial Managed Care - PPO | Source: Ambulatory Visit | Attending: Family Medicine | Admitting: Family Medicine

## 2022-01-24 DIAGNOSIS — E7800 Pure hypercholesterolemia, unspecified: Secondary | ICD-10-CM | POA: Insufficient documentation

## 2022-01-28 ENCOUNTER — Ambulatory Visit (AMBULATORY_SURGERY_CENTER): Payer: Self-pay

## 2022-01-28 VITALS — Ht 62.0 in | Wt 115.0 lb

## 2022-01-28 DIAGNOSIS — Z86010 Personal history of colonic polyps: Secondary | ICD-10-CM

## 2022-01-28 MED ORDER — NA SULFATE-K SULFATE-MG SULF 17.5-3.13-1.6 GM/177ML PO SOLN
1.0000 | Freq: Once | ORAL | 0 refills | Status: AC
  Filled 2022-01-28: qty 354, 1d supply, fill #0

## 2022-01-28 MED ORDER — ONDANSETRON HCL 4 MG PO TABS
4.0000 mg | ORAL_TABLET | ORAL | 0 refills | Status: DC
  Filled 2022-01-28: qty 2, 1d supply, fill #0

## 2022-01-28 NOTE — Progress Notes (Signed)
No egg or soy allergy known to patient  No issues known to pt with past sedation with any surgeries or procedures Patient denies ever being told they had issues or difficulty with intubation  No FH of Malignant Hyperthermia Pt is not on diet pills Pt is not on  home 02  Pt is not on blood thinners  Pt denies issues with constipation  No A fib or A flutter Have any cardiac testing pending--no Pt instructed to use [email or web address] or GoodRx for a price reduction on prep   

## 2022-01-29 ENCOUNTER — Other Ambulatory Visit: Payer: Self-pay

## 2022-01-29 ENCOUNTER — Other Ambulatory Visit (HOSPITAL_COMMUNITY): Payer: Self-pay

## 2022-01-29 DIAGNOSIS — J029 Acute pharyngitis, unspecified: Secondary | ICD-10-CM | POA: Diagnosis not present

## 2022-01-29 DIAGNOSIS — R519 Headache, unspecified: Secondary | ICD-10-CM | POA: Diagnosis not present

## 2022-01-29 DIAGNOSIS — B349 Viral infection, unspecified: Secondary | ICD-10-CM | POA: Diagnosis not present

## 2022-01-29 DIAGNOSIS — U071 COVID-19: Secondary | ICD-10-CM | POA: Diagnosis not present

## 2022-01-29 DIAGNOSIS — F411 Generalized anxiety disorder: Secondary | ICD-10-CM | POA: Diagnosis not present

## 2022-01-29 MED ORDER — PAXLOVID (300/100) 20 X 150 MG & 10 X 100MG PO TBPK
ORAL_TABLET | ORAL | 0 refills | Status: DC
  Filled 2022-01-29: qty 30, 5d supply, fill #0

## 2022-01-29 MED ORDER — METHYLPHENIDATE HCL ER (OSM) 36 MG PO TBCR
36.0000 mg | EXTENDED_RELEASE_TABLET | Freq: Every day | ORAL | 0 refills | Status: DC
  Filled 2022-02-08: qty 30, 30d supply, fill #0

## 2022-02-03 DIAGNOSIS — H5212 Myopia, left eye: Secondary | ICD-10-CM | POA: Diagnosis not present

## 2022-02-05 ENCOUNTER — Encounter: Payer: Self-pay | Admitting: Certified Registered Nurse Anesthetist

## 2022-02-05 ENCOUNTER — Encounter: Payer: Self-pay | Admitting: Gastroenterology

## 2022-02-08 ENCOUNTER — Other Ambulatory Visit (HOSPITAL_COMMUNITY): Payer: Self-pay

## 2022-02-11 ENCOUNTER — Encounter: Payer: Self-pay | Admitting: Gastroenterology

## 2022-02-11 DIAGNOSIS — F411 Generalized anxiety disorder: Secondary | ICD-10-CM | POA: Diagnosis not present

## 2022-02-11 DIAGNOSIS — F331 Major depressive disorder, recurrent, moderate: Secondary | ICD-10-CM | POA: Diagnosis not present

## 2022-02-25 DIAGNOSIS — F331 Major depressive disorder, recurrent, moderate: Secondary | ICD-10-CM | POA: Diagnosis not present

## 2022-02-25 DIAGNOSIS — F411 Generalized anxiety disorder: Secondary | ICD-10-CM | POA: Diagnosis not present

## 2022-03-04 ENCOUNTER — Other Ambulatory Visit (HOSPITAL_COMMUNITY): Payer: Self-pay

## 2022-03-04 DIAGNOSIS — F331 Major depressive disorder, recurrent, moderate: Secondary | ICD-10-CM | POA: Diagnosis not present

## 2022-03-04 DIAGNOSIS — F411 Generalized anxiety disorder: Secondary | ICD-10-CM | POA: Diagnosis not present

## 2022-03-07 DIAGNOSIS — F331 Major depressive disorder, recurrent, moderate: Secondary | ICD-10-CM | POA: Diagnosis not present

## 2022-03-07 DIAGNOSIS — F411 Generalized anxiety disorder: Secondary | ICD-10-CM | POA: Diagnosis not present

## 2022-03-10 ENCOUNTER — Other Ambulatory Visit (HOSPITAL_COMMUNITY): Payer: Self-pay

## 2022-03-10 ENCOUNTER — Ambulatory Visit (INDEPENDENT_AMBULATORY_CARE_PROVIDER_SITE_OTHER): Payer: Commercial Managed Care - PPO | Admitting: Obstetrics & Gynecology

## 2022-03-10 ENCOUNTER — Encounter: Payer: Self-pay | Admitting: Obstetrics & Gynecology

## 2022-03-10 VITALS — BP 126/84 | HR 84 | Ht 61.0 in | Wt 119.0 lb

## 2022-03-10 DIAGNOSIS — N952 Postmenopausal atrophic vaginitis: Secondary | ICD-10-CM

## 2022-03-10 DIAGNOSIS — A609 Anogenital herpesviral infection, unspecified: Secondary | ICD-10-CM

## 2022-03-10 DIAGNOSIS — Z01419 Encounter for gynecological examination (general) (routine) without abnormal findings: Secondary | ICD-10-CM | POA: Diagnosis not present

## 2022-03-10 MED ORDER — ESTRADIOL 0.1 MG/GM VA CREA
2.0000 g | TOPICAL_CREAM | VAGINAL | 4 refills | Status: DC
  Filled 2022-03-10 – 2022-03-12 (×2): qty 42.5, 70d supply, fill #0
  Filled 2022-06-20: qty 42.5, 70d supply, fill #1
  Filled 2022-08-05: qty 42.5, 30d supply, fill #2
  Filled 2022-08-06: qty 42.5, 70d supply, fill #2

## 2022-03-10 MED ORDER — ACYCLOVIR 200 MG PO CAPS
200.0000 mg | ORAL_CAPSULE | Freq: Two times a day (BID) | ORAL | 4 refills | Status: DC
  Filled 2022-03-10: qty 60, 30d supply, fill #0

## 2022-03-10 NOTE — Progress Notes (Signed)
[place] 16-Aug-1960 [id number]   History:    62 y.o. [id number]   RP:  Established patient presenting for annual gyn exam    HPI: Postmenopausal/atrophic genital changes. Would like to continue on Premarin cream twice weekly. She is not had any vaginal bleeding. No pelvic pain.  No pain with IC.  Pap smear /HPV Neg in 1/23.  No significant history of abnormal Pap smears.  Breasts normal.  Mammogram 04/2021 Neg. Colonoscopy 2020.  DEXA 11/2018 normal.  Next DEXA recommended 2025. BMI 22.48.  Health labs with Fam MD.  Has had the Flu vaccine.  Past medical history,surgical history, family history and social history were all reviewed and documented in the EPIC chart.  Gynecologic History Patient's last menstrual period was 10/16/2011.  Obstetric History OB History  Gravida Para Term Preterm AB Living  '5 1 1   4 2  '$ SAB IAB Ectopic Multiple Live Births  4            # Outcome Date GA Lbr Len/2nd Weight Sex Delivery Anes PTL Lv  5 SAB           4 SAB           3 SAB           2 SAB           1 Term              ROS: A ROS was performed and pertinent positives and negatives are included in the history. GENERAL: No fevers or chills. HEENT: No change in vision, no earache, sore throat or sinus congestion. NECK: No pain or stiffness. CARDIOVASCULAR: No chest pain or pressure. No palpitations. PULMONARY: No shortness of breath, cough or wheeze. GASTROINTESTINAL: No abdominal pain, nausea, vomiting or diarrhea, melena or bright red blood per rectum. GENITOURINARY: No urinary frequency, urgency, hesitancy or dysuria. MUSCULOSKELETAL: No joint or muscle pain, no back pain, no recent trauma. DERMATOLOGIC: No rash, no itching, no lesions. ENDOCRINE: No polyuria, polydipsia, no heat or cold intolerance. No recent change in weight. HEMATOLOGICAL: No anemia or easy bruising or bleeding. NEUROLOGIC: No headache, seizures, numbness, tingling or weakness. PSYCHIATRIC: No depression, no loss of  interest in normal activity or change in sleep pattern.     Exam:   BP 126/84 (BP Location: Right Arm, Patient Position: Sitting, Cuff Size: Normal)   Pulse 84   Ht '5\' 1"'$  (1.549 m)   Wt 119 lb (54 kg)   LMP 10/16/2011   SpO2 98%   BMI 22.48 kg/m   Body mass index is 22.48 kg/m.  General appearance : Well developed well nourished female. No acute distress HEENT: Eyes: no retinal hemorrhage or exudates,  Neck supple, trachea midline, no carotid bruits, no thyroidmegaly Lungs: Clear to auscultation, no rhonchi or wheezes, or rib retractions  Heart: Regular rate and rhythm, no murmurs or gallops Breast:Examined in sitting and supine position were symmetrical in appearance, no palpable masses or tenderness,  no skin retraction, no nipple inversion, no nipple discharge, no skin discoloration, no axillary or supraclavicular lymphadenopathy Abdomen: no palpable masses or tenderness, no rebound or guarding Extremities: no edema or skin discoloration or tenderness  Pelvic: Vulva: Normal             Vagina: No gross lesions or discharge  Cervix: No gross lesions or discharge  Uterus  AV, normal size, shape and consistency, non-tender and mobile  Adnexa  Without masses or tenderness  Anus: Normal  Assessment/Plan:  62 y.o. female for annual exam   1. Well female exam with routine gynecological exam Postmenopausal/atrophic genital changes. Would like to continue on Premarin cream twice weekly. She is not had any vaginal bleeding. No pelvic pain.  No pain with IC.  Pap smear /HPV Neg in 1/23.  No significant history of abnormal Pap smears.  Breasts normal.  Mammogram 04/2021 Neg. Colonoscopy 2020.  DEXA 11/2018 normal.  Next DEXA recommended 2025. BMI 22.48.  Health labs with Fam MD.   2. Postmenopausal atrophic vaginitis Continue with Estradiol cream, may use 1/2 an applicator twice a week.  3. HSV (herpes simplex virus) anogenital infection Acyclovir for recurrences.  Other orders -  acyclovir (ZOVIRAX) 200 MG capsule; Take 1 capsule (200 mg total) by mouth 2 (two) times daily. - estradiol (ESTRACE VAGINAL) 0.1 MG/GM vaginal cream; Insert 2 grams vaginally twice weekly.  Princess Bruins MD, 4:51 PM

## 2022-03-11 ENCOUNTER — Other Ambulatory Visit (HOSPITAL_COMMUNITY): Payer: Self-pay

## 2022-03-11 ENCOUNTER — Other Ambulatory Visit (HOSPITAL_BASED_OUTPATIENT_CLINIC_OR_DEPARTMENT_OTHER): Payer: Self-pay

## 2022-03-11 ENCOUNTER — Other Ambulatory Visit: Payer: Self-pay

## 2022-03-11 MED ORDER — METHYLPHENIDATE HCL ER (OSM) 36 MG PO TBCR
36.0000 mg | EXTENDED_RELEASE_TABLET | Freq: Every day | ORAL | 0 refills | Status: DC
  Filled 2022-03-11: qty 30, 30d supply, fill #0

## 2022-03-12 ENCOUNTER — Other Ambulatory Visit (HOSPITAL_COMMUNITY): Payer: Self-pay

## 2022-03-12 ENCOUNTER — Other Ambulatory Visit: Payer: Self-pay

## 2022-03-17 ENCOUNTER — Other Ambulatory Visit (HOSPITAL_COMMUNITY): Payer: Self-pay

## 2022-03-21 ENCOUNTER — Other Ambulatory Visit: Payer: Self-pay

## 2022-03-21 ENCOUNTER — Other Ambulatory Visit (HOSPITAL_COMMUNITY): Payer: Self-pay

## 2022-03-21 DIAGNOSIS — A609 Anogenital herpesviral infection, unspecified: Secondary | ICD-10-CM

## 2022-03-21 MED ORDER — LIDOCAINE-HYDROCORT (PERIANAL) 3-0.5 % EX CREA
TOPICAL_CREAM | CUTANEOUS | 0 refills | Status: DC
  Filled 2022-03-21: qty 85, 30d supply, fill #0

## 2022-03-21 NOTE — Telephone Encounter (Signed)
Pt notified and voiced understanding 

## 2022-03-21 NOTE — Telephone Encounter (Signed)
Pt calling to request refill on her cream that we have prescribed her in the past to help w/ sxs of outbreaks and hemorrhoids.   Last AEX 03/10/2022  Rx pend.

## 2022-03-26 ENCOUNTER — Other Ambulatory Visit (HOSPITAL_COMMUNITY): Payer: Self-pay

## 2022-03-26 DIAGNOSIS — F411 Generalized anxiety disorder: Secondary | ICD-10-CM | POA: Diagnosis not present

## 2022-03-26 MED ORDER — METHYLPHENIDATE HCL ER (OSM) 36 MG PO TBCR
36.0000 mg | EXTENDED_RELEASE_TABLET | Freq: Every day | ORAL | 0 refills | Status: DC
  Filled 2022-04-08: qty 30, 30d supply, fill #0

## 2022-03-27 ENCOUNTER — Other Ambulatory Visit (HOSPITAL_COMMUNITY): Payer: Self-pay

## 2022-03-27 MED ORDER — BUPROPION HCL ER (XL) 150 MG PO TB24
150.0000 mg | ORAL_TABLET | Freq: Every day | ORAL | 6 refills | Status: DC
  Filled 2022-03-27: qty 90, 90d supply, fill #0
  Filled 2022-06-23: qty 90, 90d supply, fill #1
  Filled 2022-09-21: qty 90, 90d supply, fill #2
  Filled 2022-12-18 (×2): qty 90, 90d supply, fill #3
  Filled 2023-03-17 (×2): qty 90, 90d supply, fill #4

## 2022-04-08 ENCOUNTER — Other Ambulatory Visit (HOSPITAL_COMMUNITY): Payer: Self-pay

## 2022-04-09 ENCOUNTER — Other Ambulatory Visit: Payer: Self-pay

## 2022-04-09 ENCOUNTER — Other Ambulatory Visit (HOSPITAL_COMMUNITY): Payer: Self-pay

## 2022-04-09 MED ORDER — DICLOFENAC SODIUM 75 MG PO TBEC
75.0000 mg | DELAYED_RELEASE_TABLET | ORAL | 0 refills | Status: DC
  Filled 2022-04-09: qty 45, 90d supply, fill #0

## 2022-04-12 ENCOUNTER — Other Ambulatory Visit (HOSPITAL_COMMUNITY): Payer: Self-pay

## 2022-04-19 ENCOUNTER — Other Ambulatory Visit (HOSPITAL_COMMUNITY): Payer: Self-pay

## 2022-04-26 ENCOUNTER — Emergency Department (HOSPITAL_BASED_OUTPATIENT_CLINIC_OR_DEPARTMENT_OTHER)
Admission: EM | Admit: 2022-04-26 | Discharge: 2022-04-26 | Disposition: A | Discharge status: Home / Self Care | Payer: Commercial Managed Care - PPO | Attending: Emergency Medicine | Admitting: Emergency Medicine

## 2022-04-26 ENCOUNTER — Other Ambulatory Visit: Payer: Self-pay

## 2022-04-26 ENCOUNTER — Encounter (HOSPITAL_BASED_OUTPATIENT_CLINIC_OR_DEPARTMENT_OTHER): Payer: Self-pay | Admitting: Emergency Medicine

## 2022-04-26 DIAGNOSIS — H43392 Other vitreous opacities, left eye: Secondary | ICD-10-CM | POA: Insufficient documentation

## 2022-04-26 NOTE — ED Provider Notes (Signed)
[place] EMERGENCY DEPARTMENT AT [facility] Provider Note   CSN: [id number] Arrival date & time: 04/26/22  1226     History  No chief complaint on file.   Ann Mendez is a 62 y.o. female who presents emergency department with a chief complaint of left eye floaters.  Patient reports that earlier today she had onset of floaters in her eye.  She had many at one time but did not have any changes in her vision.  She says she only has 1 small floater in her eye right now she has no other complaints at this time.  She has had a family history of retinal detachment and was concerned.  Upon entering the room patient was already on the phone with her eye doctor who said that he went to see her in the office on Monday and since she does not have any changes in her vision and the safe to wait.-Denies eye pain or injury nausea or vomiting  HPI     Home Medications Prior to Admission medications   Medication Sig Start Date End Date Taking? Authorizing Provider  acyclovir (ZOVIRAX) 200 MG capsule Take 1 capsule (200 mg total) by mouth 2 (two) times daily. 03/10/22   Genia Del, MD  b complex vitamins tablet Take 1 tablet by mouth daily.    [provider]  buPROPion (WELLBUTRIN XL) 150 MG 24 hr tablet Take 1 tablet by mouth once daily 12/31/20     buPROPion (WELLBUTRIN XL) 150 MG 24 hr tablet Take 1 tablet (150 mg total) by mouth daily. 03/27/22     cholecalciferol (VITAMIN D) 1000 UNITS tablet Take 2,000 Units by mouth daily.     [provider]  diclofenac (VOLTAREN) 75 MG EC tablet TAKE 1 TABLET BY MOUTH ONCE DAILY 04/30/21     diclofenac (VOLTAREN) 75 MG EC tablet Take 1 tablet (75 mg total) by mouth every other day. 04/09/22     estradiol (ESTRACE VAGINAL) 0.1 MG/GM vaginal cream Place 2 g vaginally 2 (two) times a week. 03/10/22   Genia Del, MD  levothyroxine (SYNTHROID) 25 MCG tablet Take 1 tablet by mouth in the morning on an empty stomach 06/26/21      Lidocaine-Hydrocort, Perianal, 3-0.5 % CREA Apply thin film locally to affected area up to 3 times daily as needed, avoid intravaginal application 03/21/22   Genia Del, MD  methylphenidate 36 MG PO CR tablet Take 1 tablet (36 mg total) by mouth daily. 03/26/22     rosuvastatin (CRESTOR) 5 MG tablet Take 1 tablet (5 mg total) by mouth daily. 12/16/21     sertraline (ZOLOFT) 100 MG tablet Take 2 tablets (200 mg total) by mouth daily. 01/20/22     sodium chloride 0.9 % nebulizer solution Use as directed 08/16/21         Allergies    Stadol [butorphanol] and Sulfa antibiotics    Review of Systems   Review of Systems  Physical Exam Updated Vital Signs BP (!) 151/95 (BP Location: Right Arm)   Pulse 74   Temp 98.1 F (36.7 C)   Resp 18   LMP 10/16/2011   SpO2 99%  Physical Exam Vitals and nursing note reviewed.  Constitutional:      General: She is not in acute distress.    Appearance: She is well-developed. She is not diaphoretic.  HENT:     Head: Normocephalic and atraumatic.     Right Ear: External ear normal.     Left  Ear: External ear normal.     Nose: Nose normal.     Mouth/Throat:     Mouth: Mucous membranes are moist.  Eyes:     General: No scleral icterus.    Conjunctiva/sclera: Conjunctivae normal.     Funduscopic exam:    Right eye: No hemorrhage, exudate, AV nicking or papilledema. Red reflex present.        Left eye: No hemorrhage, exudate, AV nicking or papilledema. Red reflex present. Cardiovascular:     Rate and Rhythm: Normal rate and regular rhythm.     Heart sounds: Normal heart sounds. No murmur heard.    No friction rub. No gallop.  Pulmonary:     Effort: Pulmonary effort is normal. No respiratory distress.     Breath sounds: Normal breath sounds.  Abdominal:     General: Bowel sounds are normal. There is no distension.     Palpations: Abdomen is soft. There is no mass.     Tenderness: There is no abdominal tenderness. There is no guarding.   Musculoskeletal:     Cervical back: Normal range of motion.  Skin:    General: Skin is warm and dry.  Neurological:     Mental Status: She is alert and oriented to person, place, and time.  Psychiatric:        Behavior: Behavior normal.     ED Results / Procedures / Treatments   Labs (all labs ordered are listed, but only abnormal results are displayed) Labs Reviewed - No data to display  EKG None  Radiology No results found.  Procedures Procedures    Medications Ordered in ED Medications - No data to display  ED Course/ Medical Decision Making/ A&P                             Medical Decision Making  Patient here with floaters.  Likely vitreous in nature.  No concern for retinal detachment.  Vision intact.  Will see her eye doctor on Monday or Tuesday.  Discussed return precautions.        Final Clinical Impression(s) / ED Diagnoses Final diagnoses:  Vitreous floaters of left eye    Rx / DC Orders ED Discharge Orders     None         Arthor Captain, PA-C 04/26/22 1409    Mardene Sayer, MD 04/27/22 [phone]

## 2022-04-26 NOTE — ED Triage Notes (Signed)
Left eye started with floaters/light flashes. Floaters are still there. This happened about 30 minutes ago. Pt states both parents have had detached retinas in past. No neuro changes.

## 2022-04-26 NOTE — Discharge Instructions (Signed)
Contact a health care provider if: You cannot see well because of your floaters. You develop any new symptoms. Get help right away if: You develop a sudden increase in the number of floaters you see. You develop flashes along with floaters. It looks as if a curtain is blocking part of your vision. Your vision suddenly changes, or you lose your vision completely. These symptoms may be an emergency. Get help right away. Call 911. Do not wait to see if the symptoms will go away. Do not drive yourself to the hospital.

## 2022-04-29 DIAGNOSIS — L570 Actinic keratosis: Secondary | ICD-10-CM | POA: Diagnosis not present

## 2022-04-29 DIAGNOSIS — X32XXXD Exposure to sunlight, subsequent encounter: Secondary | ICD-10-CM | POA: Diagnosis not present

## 2022-04-29 DIAGNOSIS — Z1231 Encounter for screening mammogram for malignant neoplasm of breast: Secondary | ICD-10-CM | POA: Diagnosis not present

## 2022-04-30 ENCOUNTER — Encounter: Payer: Self-pay | Admitting: Obstetrics & Gynecology

## 2022-05-02 DIAGNOSIS — F411 Generalized anxiety disorder: Secondary | ICD-10-CM | POA: Diagnosis not present

## 2022-05-06 ENCOUNTER — Other Ambulatory Visit: Payer: Self-pay

## 2022-05-06 ENCOUNTER — Other Ambulatory Visit (HOSPITAL_COMMUNITY): Payer: Self-pay

## 2022-05-06 MED ORDER — METHYLPHENIDATE HCL ER (OSM) 36 MG PO TBCR
36.0000 mg | EXTENDED_RELEASE_TABLET | Freq: Every day | ORAL | 0 refills | Status: DC
  Filled 2022-05-06 – 2022-05-12 (×2): qty 30, 30d supply, fill #0

## 2022-05-09 ENCOUNTER — Other Ambulatory Visit: Payer: Self-pay | Admitting: Sports Medicine

## 2022-05-09 DIAGNOSIS — M65342 Trigger finger, left ring finger: Secondary | ICD-10-CM | POA: Diagnosis not present

## 2022-05-09 DIAGNOSIS — M65332 Trigger finger, left middle finger: Secondary | ICD-10-CM | POA: Diagnosis not present

## 2022-05-12 ENCOUNTER — Other Ambulatory Visit (HOSPITAL_COMMUNITY): Payer: Self-pay

## 2022-05-13 ENCOUNTER — Other Ambulatory Visit (HOSPITAL_COMMUNITY): Payer: Self-pay

## 2022-05-13 MED ORDER — FLUOROURACIL 5 % EX CREA
TOPICAL_CREAM | CUTANEOUS | 1 refills | Status: DC
  Filled 2022-05-13: qty 40, 14d supply, fill #0

## 2022-05-15 DIAGNOSIS — F332 Major depressive disorder, recurrent severe without psychotic features: Secondary | ICD-10-CM | POA: Diagnosis not present

## 2022-05-15 DIAGNOSIS — F411 Generalized anxiety disorder: Secondary | ICD-10-CM | POA: Diagnosis not present

## 2022-05-21 DIAGNOSIS — G5603 Carpal tunnel syndrome, bilateral upper limbs: Secondary | ICD-10-CM | POA: Diagnosis not present

## 2022-06-04 ENCOUNTER — Other Ambulatory Visit (HOSPITAL_COMMUNITY): Payer: Self-pay

## 2022-06-04 DIAGNOSIS — F332 Major depressive disorder, recurrent severe without psychotic features: Secondary | ICD-10-CM | POA: Diagnosis not present

## 2022-06-04 MED ORDER — METHYLPHENIDATE HCL ER (OSM) 36 MG PO TBCR
36.0000 mg | EXTENDED_RELEASE_TABLET | Freq: Every day | ORAL | 0 refills | Status: DC
  Filled 2022-06-04 – 2022-06-12 (×3): qty 30, 30d supply, fill #0

## 2022-06-05 ENCOUNTER — Telehealth: Payer: Self-pay

## 2022-06-05 NOTE — Telephone Encounter (Signed)
Pt calling to report trouble w/ external hemorrhoids for a couple weeks now with no relief w/ rx lidocaine/hydrocortisone or has also been doing sitz baths with epsom salts. Pt reports in the past when was prescribed cream for hemorrhoids, she was given an applicator so that it could be applied internally as well if needed and pt is wondering since she hasn't been applying internally, if that is the reason why she cannot get any relief.?   Pt advised that the cream prescribed by ML in 03/2022 was not given with applicator due to the fact that cream was not supposed to be for internal use. Just for external use only. Pt voiced understanding. However, does desire an OV to have an evaluation on hemorrhoids (will send msg to appt desk) but in the meantime is checking to see if another rx or OTC recommendation is/can be available to her. Please advise.

## 2022-06-06 NOTE — Telephone Encounter (Signed)
Per review of EPIC, patient is scheduled with Dr. Seymour Bars on 5/31 at 1330.   Routing to provider for final review.  Will close encounter.

## 2022-06-10 ENCOUNTER — Other Ambulatory Visit: Payer: Self-pay

## 2022-06-10 ENCOUNTER — Other Ambulatory Visit (HOSPITAL_COMMUNITY): Payer: Self-pay

## 2022-06-10 MED ORDER — NALOXONE HCL 4 MG/0.1ML NA LIQD
NASAL | 99 refills | Status: DC
  Filled 2022-06-10: qty 2, 15d supply, fill #0

## 2022-06-11 ENCOUNTER — Other Ambulatory Visit (HOSPITAL_COMMUNITY): Payer: Self-pay

## 2022-06-12 ENCOUNTER — Other Ambulatory Visit (HOSPITAL_COMMUNITY): Payer: Self-pay

## 2022-06-13 ENCOUNTER — Other Ambulatory Visit (HOSPITAL_COMMUNITY): Payer: Self-pay

## 2022-06-13 ENCOUNTER — Ambulatory Visit: Payer: Commercial Managed Care - PPO | Admitting: Obstetrics & Gynecology

## 2022-06-20 ENCOUNTER — Other Ambulatory Visit (HOSPITAL_COMMUNITY): Payer: Self-pay

## 2022-06-23 ENCOUNTER — Other Ambulatory Visit (HOSPITAL_COMMUNITY): Payer: Self-pay

## 2022-06-23 MED ORDER — LEVOTHYROXINE SODIUM 25 MCG PO TABS
25.0000 ug | ORAL_TABLET | Freq: Every morning | ORAL | 0 refills | Status: DC
  Filled 2022-06-23: qty 90, 90d supply, fill #0

## 2022-06-24 DIAGNOSIS — F4323 Adjustment disorder with mixed anxiety and depressed mood: Secondary | ICD-10-CM | POA: Diagnosis not present

## 2022-07-02 DIAGNOSIS — F4323 Adjustment disorder with mixed anxiety and depressed mood: Secondary | ICD-10-CM | POA: Diagnosis not present

## 2022-07-04 DIAGNOSIS — L821 Other seborrheic keratosis: Secondary | ICD-10-CM | POA: Diagnosis not present

## 2022-07-04 DIAGNOSIS — L820 Inflamed seborrheic keratosis: Secondary | ICD-10-CM | POA: Diagnosis not present

## 2022-07-08 DIAGNOSIS — F4323 Adjustment disorder with mixed anxiety and depressed mood: Secondary | ICD-10-CM | POA: Diagnosis not present

## 2022-07-09 ENCOUNTER — Other Ambulatory Visit (HOSPITAL_COMMUNITY): Payer: Self-pay

## 2022-07-10 ENCOUNTER — Other Ambulatory Visit (HOSPITAL_COMMUNITY): Payer: Self-pay

## 2022-07-10 ENCOUNTER — Other Ambulatory Visit: Payer: Self-pay

## 2022-07-10 MED ORDER — METHYLPHENIDATE HCL ER (OSM) 36 MG PO TBCR
36.0000 mg | EXTENDED_RELEASE_TABLET | Freq: Every day | ORAL | 0 refills | Status: DC
  Filled 2022-07-10 – 2022-09-08 (×2): qty 30, 30d supply, fill #0

## 2022-07-14 ENCOUNTER — Other Ambulatory Visit (HOSPITAL_COMMUNITY): Payer: Self-pay

## 2022-07-14 ENCOUNTER — Other Ambulatory Visit: Payer: Self-pay

## 2022-07-14 DIAGNOSIS — F4322 Adjustment disorder with anxiety: Secondary | ICD-10-CM | POA: Diagnosis not present

## 2022-07-14 MED ORDER — METHYLPHENIDATE HCL ER (OSM) 36 MG PO TBCR
36.0000 mg | EXTENDED_RELEASE_TABLET | Freq: Every day | ORAL | 0 refills | Status: DC
  Filled 2022-07-14 – 2022-10-07 (×3): qty 30, 30d supply, fill #0

## 2022-07-14 MED ORDER — METHYLPHENIDATE HCL ER 36 MG PO TB24
36.0000 mg | ORAL_TABLET | Freq: Every day | ORAL | 0 refills | Status: DC
  Filled 2022-07-14: qty 30, 30d supply, fill #0

## 2022-07-15 ENCOUNTER — Other Ambulatory Visit (HOSPITAL_COMMUNITY): Payer: Self-pay

## 2022-07-18 ENCOUNTER — Other Ambulatory Visit: Payer: Self-pay

## 2022-07-18 ENCOUNTER — Other Ambulatory Visit (HOSPITAL_COMMUNITY): Payer: Self-pay

## 2022-07-21 DIAGNOSIS — F4323 Adjustment disorder with mixed anxiety and depressed mood: Secondary | ICD-10-CM | POA: Diagnosis not present

## 2022-07-25 DIAGNOSIS — M25532 Pain in left wrist: Secondary | ICD-10-CM | POA: Diagnosis not present

## 2022-07-25 DIAGNOSIS — G5603 Carpal tunnel syndrome, bilateral upper limbs: Secondary | ICD-10-CM | POA: Diagnosis not present

## 2022-07-25 DIAGNOSIS — M25531 Pain in right wrist: Secondary | ICD-10-CM | POA: Diagnosis not present

## 2022-07-29 DIAGNOSIS — F4323 Adjustment disorder with mixed anxiety and depressed mood: Secondary | ICD-10-CM | POA: Diagnosis not present

## 2022-08-05 ENCOUNTER — Other Ambulatory Visit (HOSPITAL_COMMUNITY): Payer: Self-pay

## 2022-08-05 ENCOUNTER — Telehealth: Payer: Self-pay

## 2022-08-05 NOTE — Telephone Encounter (Signed)
ML pt LVM in triage line stating that she tried to call her pharmacy for refill on estradiol cream and the automated system advised her that it was too soon for her to get the rx filled. She stated that she uses the cream as directed and as of today, has ~2 doses left of cream. Pt reports going on vacation this weekend and was hoping to have new tube of cream prior to leaving.  Spoke w/ pt to confirm that she is using the 2gms twice weekly. Advised her that usually the tube (applicator) provided with cream is 4gms total and she confirmed only fills it halfway (at the 2gm mark) prior to inserting intravaginally.   Please advise.

## 2022-08-06 ENCOUNTER — Other Ambulatory Visit: Payer: Self-pay | Admitting: Obstetrics and Gynecology

## 2022-08-06 ENCOUNTER — Other Ambulatory Visit (HOSPITAL_COMMUNITY): Payer: Self-pay

## 2022-08-06 ENCOUNTER — Other Ambulatory Visit: Payer: Self-pay

## 2022-08-06 MED ORDER — ESTRADIOL 0.1 MG/GM VA CREA
2.0000 g | TOPICAL_CREAM | VAGINAL | 2 refills | Status: DC
  Filled 2022-08-06: qty 42.5, 74d supply, fill #0
  Filled 2022-08-08: qty 42.5, 73d supply, fill #0
  Filled 2022-08-15: qty 42.5, 70d supply, fill #0
  Filled 2022-10-17: qty 42.5, 70d supply, fill #1
  Filled 2022-12-21: qty 42.5, 70d supply, fill #2

## 2022-08-06 NOTE — Progress Notes (Signed)
Refill of vaginal estradiol cream.

## 2022-08-06 NOTE — Telephone Encounter (Signed)
Refills given for estradiol cream. You may close encounter.

## 2022-08-06 NOTE — Telephone Encounter (Signed)
Called pt but unable to LVM due to mailbox being full. Will send mychart msg.   Mychart msg sent. Encounter closed.

## 2022-08-07 ENCOUNTER — Other Ambulatory Visit (HOSPITAL_COMMUNITY): Payer: Self-pay

## 2022-08-07 DIAGNOSIS — F332 Major depressive disorder, recurrent severe without psychotic features: Secondary | ICD-10-CM | POA: Diagnosis not present

## 2022-08-07 MED ORDER — METHYLPHENIDATE HCL ER 36 MG PO TB24
36.0000 mg | ORAL_TABLET | Freq: Every day | ORAL | 0 refills | Status: DC
  Filled 2022-08-07 (×2): qty 30, 30d supply, fill #0

## 2022-08-08 ENCOUNTER — Other Ambulatory Visit (HOSPITAL_COMMUNITY): Payer: Self-pay

## 2022-08-08 ENCOUNTER — Other Ambulatory Visit: Payer: Self-pay

## 2022-08-08 MED ORDER — DICLOFENAC SODIUM 75 MG PO TBEC
75.0000 mg | DELAYED_RELEASE_TABLET | ORAL | 0 refills | Status: DC
  Filled 2022-08-08: qty 45, 90d supply, fill #0

## 2022-08-15 ENCOUNTER — Other Ambulatory Visit (HOSPITAL_COMMUNITY): Payer: Self-pay

## 2022-08-16 ENCOUNTER — Other Ambulatory Visit (HOSPITAL_COMMUNITY): Payer: Self-pay

## 2022-08-25 DIAGNOSIS — E7800 Pure hypercholesterolemia, unspecified: Secondary | ICD-10-CM | POA: Diagnosis not present

## 2022-08-25 DIAGNOSIS — E559 Vitamin D deficiency, unspecified: Secondary | ICD-10-CM | POA: Diagnosis not present

## 2022-08-25 DIAGNOSIS — E039 Hypothyroidism, unspecified: Secondary | ICD-10-CM | POA: Diagnosis not present

## 2022-08-26 ENCOUNTER — Other Ambulatory Visit (HOSPITAL_COMMUNITY): Payer: Self-pay

## 2022-08-26 ENCOUNTER — Other Ambulatory Visit: Payer: Self-pay

## 2022-08-26 ENCOUNTER — Other Ambulatory Visit: Payer: Self-pay | Admitting: Sports Medicine

## 2022-08-26 DIAGNOSIS — M25512 Pain in left shoulder: Secondary | ICD-10-CM | POA: Diagnosis not present

## 2022-08-26 DIAGNOSIS — M542 Cervicalgia: Secondary | ICD-10-CM | POA: Diagnosis not present

## 2022-08-26 DIAGNOSIS — M25511 Pain in right shoulder: Secondary | ICD-10-CM | POA: Diagnosis not present

## 2022-08-26 DIAGNOSIS — F3341 Major depressive disorder, recurrent, in partial remission: Secondary | ICD-10-CM | POA: Diagnosis not present

## 2022-08-26 DIAGNOSIS — M9902 Segmental and somatic dysfunction of thoracic region: Secondary | ICD-10-CM | POA: Diagnosis not present

## 2022-08-26 DIAGNOSIS — M9901 Segmental and somatic dysfunction of cervical region: Secondary | ICD-10-CM | POA: Diagnosis not present

## 2022-08-26 DIAGNOSIS — M9907 Segmental and somatic dysfunction of upper extremity: Secondary | ICD-10-CM | POA: Diagnosis not present

## 2022-08-26 MED ORDER — METHYLPREDNISOLONE 4 MG PO TBPK
ORAL_TABLET | ORAL | 0 refills | Status: DC
  Filled 2022-08-26: qty 21, 6d supply, fill #0

## 2022-08-28 ENCOUNTER — Ambulatory Visit
Admission: RE | Admit: 2022-08-28 | Discharge: 2022-08-28 | Disposition: A | Discharge status: Home / Self Care | Payer: Commercial Managed Care - PPO | Source: Ambulatory Visit | Attending: Sports Medicine | Admitting: Sports Medicine

## 2022-08-28 DIAGNOSIS — M542 Cervicalgia: Secondary | ICD-10-CM

## 2022-09-01 ENCOUNTER — Other Ambulatory Visit (HOSPITAL_COMMUNITY): Payer: Self-pay

## 2022-09-01 DIAGNOSIS — E039 Hypothyroidism, unspecified: Secondary | ICD-10-CM | POA: Diagnosis not present

## 2022-09-01 DIAGNOSIS — F418 Other specified anxiety disorders: Secondary | ICD-10-CM | POA: Diagnosis not present

## 2022-09-01 DIAGNOSIS — Z0000 Encounter for general adult medical examination without abnormal findings: Secondary | ICD-10-CM | POA: Diagnosis not present

## 2022-09-01 DIAGNOSIS — E7800 Pure hypercholesterolemia, unspecified: Secondary | ICD-10-CM | POA: Diagnosis not present

## 2022-09-01 DIAGNOSIS — E559 Vitamin D deficiency, unspecified: Secondary | ICD-10-CM | POA: Diagnosis not present

## 2022-09-01 DIAGNOSIS — Z6820 Body mass index (BMI) 20.0-20.9, adult: Secondary | ICD-10-CM | POA: Diagnosis not present

## 2022-09-01 DIAGNOSIS — L603 Nail dystrophy: Secondary | ICD-10-CM | POA: Diagnosis not present

## 2022-09-01 MED ORDER — ROSUVASTATIN CALCIUM 10 MG PO TABS
10.0000 mg | ORAL_TABLET | Freq: Every day | ORAL | 3 refills | Status: DC
  Filled 2022-09-01: qty 90, 90d supply, fill #0

## 2022-09-04 ENCOUNTER — Other Ambulatory Visit (HOSPITAL_COMMUNITY): Payer: Self-pay

## 2022-09-04 DIAGNOSIS — M9908 Segmental and somatic dysfunction of rib cage: Secondary | ICD-10-CM | POA: Diagnosis not present

## 2022-09-04 DIAGNOSIS — M542 Cervicalgia: Secondary | ICD-10-CM | POA: Diagnosis not present

## 2022-09-04 DIAGNOSIS — M9901 Segmental and somatic dysfunction of cervical region: Secondary | ICD-10-CM | POA: Diagnosis not present

## 2022-09-04 DIAGNOSIS — M9902 Segmental and somatic dysfunction of thoracic region: Secondary | ICD-10-CM | POA: Diagnosis not present

## 2022-09-04 MED ORDER — DICLOFENAC SODIUM 75 MG PO TBEC
75.0000 mg | DELAYED_RELEASE_TABLET | Freq: Every day | ORAL | 1 refills | Status: DC
  Filled 2022-09-04 – 2022-11-06 (×2): qty 30, 30d supply, fill #0
  Filled 2022-12-31: qty 30, 30d supply, fill #1

## 2022-09-04 MED ORDER — TIZANIDINE HCL 4 MG PO TABS
4.0000 mg | ORAL_TABLET | Freq: Three times a day (TID) | ORAL | 0 refills | Status: DC | PRN
  Filled 2022-09-04 – 2022-09-05 (×2): qty 90, 30d supply, fill #0

## 2022-09-05 ENCOUNTER — Other Ambulatory Visit (HOSPITAL_COMMUNITY): Payer: Self-pay

## 2022-09-05 ENCOUNTER — Other Ambulatory Visit: Payer: Self-pay

## 2022-09-08 ENCOUNTER — Other Ambulatory Visit (HOSPITAL_COMMUNITY): Payer: Self-pay

## 2022-09-09 ENCOUNTER — Other Ambulatory Visit: Payer: Self-pay | Admitting: Sports Medicine

## 2022-09-09 DIAGNOSIS — M4696 Unspecified inflammatory spondylopathy, lumbar region: Secondary | ICD-10-CM | POA: Diagnosis not present

## 2022-09-09 DIAGNOSIS — M9902 Segmental and somatic dysfunction of thoracic region: Secondary | ICD-10-CM | POA: Diagnosis not present

## 2022-09-09 DIAGNOSIS — M9908 Segmental and somatic dysfunction of rib cage: Secondary | ICD-10-CM | POA: Diagnosis not present

## 2022-09-09 DIAGNOSIS — M5382 Other specified dorsopathies, cervical region: Secondary | ICD-10-CM

## 2022-09-09 DIAGNOSIS — M542 Cervicalgia: Secondary | ICD-10-CM | POA: Diagnosis not present

## 2022-09-09 DIAGNOSIS — M5013 Cervical disc disorder with radiculopathy, cervicothoracic region: Secondary | ICD-10-CM

## 2022-09-09 DIAGNOSIS — M4692 Unspecified inflammatory spondylopathy, cervical region: Secondary | ICD-10-CM

## 2022-09-09 DIAGNOSIS — M4722 Other spondylosis with radiculopathy, cervical region: Secondary | ICD-10-CM | POA: Diagnosis not present

## 2022-09-09 DIAGNOSIS — M9907 Segmental and somatic dysfunction of upper extremity: Secondary | ICD-10-CM | POA: Diagnosis not present

## 2022-09-09 DIAGNOSIS — M9901 Segmental and somatic dysfunction of cervical region: Secondary | ICD-10-CM | POA: Diagnosis not present

## 2022-09-10 ENCOUNTER — Other Ambulatory Visit: Payer: Self-pay

## 2022-09-21 ENCOUNTER — Other Ambulatory Visit (HOSPITAL_COMMUNITY): Payer: Self-pay

## 2022-09-22 ENCOUNTER — Other Ambulatory Visit (HOSPITAL_COMMUNITY): Payer: Self-pay

## 2022-09-25 ENCOUNTER — Other Ambulatory Visit (HOSPITAL_COMMUNITY): Payer: Self-pay

## 2022-09-25 MED ORDER — LEVOTHYROXINE SODIUM 25 MCG PO TABS
25.0000 ug | ORAL_TABLET | Freq: Every morning | ORAL | 3 refills | Status: DC
  Filled 2022-09-25: qty 90, 90d supply, fill #0
  Filled 2022-12-21: qty 90, 90d supply, fill #1
  Filled 2023-03-24: qty 90, 90d supply, fill #2
  Filled 2023-06-20 – 2023-06-22 (×2): qty 90, 90d supply, fill #3

## 2022-10-02 ENCOUNTER — Other Ambulatory Visit: Payer: Commercial Managed Care - PPO

## 2022-10-03 ENCOUNTER — Ambulatory Visit
Admission: RE | Admit: 2022-10-03 | Discharge: 2022-10-03 | Disposition: A | Discharge status: Home / Self Care | Payer: Commercial Managed Care - PPO | Source: Ambulatory Visit | Attending: Sports Medicine | Admitting: Sports Medicine

## 2022-10-03 DIAGNOSIS — M5382 Other specified dorsopathies, cervical region: Secondary | ICD-10-CM

## 2022-10-03 DIAGNOSIS — M47812 Spondylosis without myelopathy or radiculopathy, cervical region: Secondary | ICD-10-CM | POA: Diagnosis not present

## 2022-10-03 DIAGNOSIS — M4692 Unspecified inflammatory spondylopathy, cervical region: Secondary | ICD-10-CM

## 2022-10-03 DIAGNOSIS — M5013 Cervical disc disorder with radiculopathy, cervicothoracic region: Secondary | ICD-10-CM

## 2022-10-07 ENCOUNTER — Other Ambulatory Visit: Payer: Self-pay

## 2022-10-07 ENCOUNTER — Other Ambulatory Visit (HOSPITAL_COMMUNITY): Payer: Self-pay

## 2022-10-08 ENCOUNTER — Other Ambulatory Visit: Payer: Self-pay

## 2022-10-08 DIAGNOSIS — L603 Nail dystrophy: Secondary | ICD-10-CM | POA: Diagnosis not present

## 2022-10-09 DIAGNOSIS — F411 Generalized anxiety disorder: Secondary | ICD-10-CM | POA: Diagnosis not present

## 2022-10-13 ENCOUNTER — Other Ambulatory Visit (HOSPITAL_COMMUNITY): Payer: Self-pay

## 2022-10-13 DIAGNOSIS — M4722 Other spondylosis with radiculopathy, cervical region: Secondary | ICD-10-CM | POA: Diagnosis not present

## 2022-10-13 DIAGNOSIS — M9908 Segmental and somatic dysfunction of rib cage: Secondary | ICD-10-CM | POA: Diagnosis not present

## 2022-10-13 DIAGNOSIS — M9902 Segmental and somatic dysfunction of thoracic region: Secondary | ICD-10-CM | POA: Diagnosis not present

## 2022-10-13 DIAGNOSIS — M9901 Segmental and somatic dysfunction of cervical region: Secondary | ICD-10-CM | POA: Diagnosis not present

## 2022-10-13 DIAGNOSIS — M25542 Pain in joints of left hand: Secondary | ICD-10-CM | POA: Diagnosis not present

## 2022-10-13 DIAGNOSIS — M79652 Pain in left thigh: Secondary | ICD-10-CM | POA: Diagnosis not present

## 2022-10-13 DIAGNOSIS — M9904 Segmental and somatic dysfunction of sacral region: Secondary | ICD-10-CM | POA: Diagnosis not present

## 2022-10-13 DIAGNOSIS — M542 Cervicalgia: Secondary | ICD-10-CM | POA: Diagnosis not present

## 2022-10-13 DIAGNOSIS — M25532 Pain in left wrist: Secondary | ICD-10-CM | POA: Diagnosis not present

## 2022-10-13 DIAGNOSIS — M9905 Segmental and somatic dysfunction of pelvic region: Secondary | ICD-10-CM | POA: Diagnosis not present

## 2022-10-13 MED ORDER — GABAPENTIN 300 MG PO CAPS
300.0000 mg | ORAL_CAPSULE | Freq: Three times a day (TID) | ORAL | 0 refills | Status: DC | PRN
  Filled 2022-10-13: qty 90, 30d supply, fill #0

## 2022-10-14 ENCOUNTER — Other Ambulatory Visit: Payer: Self-pay | Admitting: Sports Medicine

## 2022-10-15 ENCOUNTER — Telehealth: Payer: Self-pay

## 2022-10-15 ENCOUNTER — Telehealth: Payer: Self-pay | Admitting: Physical Medicine and Rehabilitation

## 2022-10-15 NOTE — Telephone Encounter (Signed)
Spoke with patient and scheduled OV for 10/20/22.

## 2022-10-15 NOTE — Telephone Encounter (Signed)
Patient called needing to schedule because she has severe neck pain and left hand pain. CB#[phone]

## 2022-10-15 NOTE — Telephone Encounter (Signed)
-----   Message from Naaman Plummer sent at 10/13/2022  1:24 PM EDT ----- Ov ----- Message ----- From: Sharlet Salina, CMA Sent: 10/13/2022  12:55 PM EDT To: Tyrell Antonio, MD   ----- Message ----- From: Dorcas Mcmurray Sent: 10/13/2022  12:06 PM EDT To: Sharlet Salina, CMA

## 2022-10-17 ENCOUNTER — Other Ambulatory Visit (HOSPITAL_COMMUNITY): Payer: Self-pay

## 2022-10-20 ENCOUNTER — Encounter: Payer: Self-pay | Admitting: Physical Medicine and Rehabilitation

## 2022-10-20 ENCOUNTER — Ambulatory Visit: Payer: Commercial Managed Care - PPO | Admitting: Physical Medicine and Rehabilitation

## 2022-10-20 DIAGNOSIS — M4802 Spinal stenosis, cervical region: Secondary | ICD-10-CM

## 2022-10-20 DIAGNOSIS — R202 Paresthesia of skin: Secondary | ICD-10-CM | POA: Diagnosis not present

## 2022-10-20 DIAGNOSIS — M5412 Radiculopathy, cervical region: Secondary | ICD-10-CM

## 2022-10-20 NOTE — Progress Notes (Unsigned)
Ann Mendez - 62 y.o. female MRN [id number]  Date of birth: [birth year]  Office Visit Note: Visit Date: 10/20/2022 PCP: Jarrett Soho, PA-C Referred by: Jarrett Soho, PA-C  Subjective: Chief Complaint  Patient presents with   Neck - Pain   HPI: Ann Mendez is a 62 y.o. female who comes in today per the request of Dr. Gaspar Bidding for evaluation of chronic, worsening and severe right sided neck pain, numbness to left hand/fingers. Symptoms ongoing for 3 months. Pain worsens with movement and activity, seems to be more severe at nighttime, she describes pain as throbbing and burning sensation, currently rates as 7 out of 10. Some relief of pain with home exercise regimen, heat/ice, osteopathic manipulation, rest and use of medications. Recently started Gabapentin that does help with pain and sleep. Recent cervical MRI imaging exhibits reversal or normal cervical lordosis, multi level foraminal stenosis, most severe at C5-C6. No high grade spinal canal stenosis. No history of cervical surgery/injections. She reports history of bilateral carpal tunnel syndrome, Dr. Berline Chough recently performed left carpel tunnel injection/ultrasound guided hydro dissection with significant relief of pain for about 3 weeks. No history of formal nerve testing. Patient denies focal weakness, numbness and tingling. No recent trauma or falls.    Review of Systems  Musculoskeletal:  Positive for myalgias and neck pain.  Neurological:  Positive for tingling. Negative for focal weakness and weakness.  All other systems reviewed and are negative.  Otherwise per HPI.  Assessment & Plan: Visit Diagnoses:    ICD-10-CM   1. Radiculopathy, cervical region  M54.12 Ambulatory referral to Physical Medicine Rehab    2. 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 Paresthesia of skin  R20.2 Ambulatory referral to Physical Medicine Rehab       Plan:  Findings:  Chronic, worsening and severe right sided neck pain and paresthesias noted to left hand/fingers. Patient continues to have severe pain despite good conservative therapies such as home exercise regimen, rest and use of medications. Although she does not exhibit classic symptoms of pain radiating down arms I do feel this is more of a cervical radiculopathy, likely C6 nerve pattern. There is multi level foraminal stenosis noted on recent cervical MRI imaging. Next step is to perform diagnostic and hopefully therapeutic right C7-T1 interlaminar epidural steroid injection under fluoroscopic guidance. She is not currently taking anticoagulant medication. If good relief of injection we can repeat this procedure infrequently as needed. Dr. Alvester Morin and myself did speak with her briefly about possibility of double crush syndrome, would consider bilateral upper extremity nerve testing. We will get her in for injection first and see how this procedure helps with her pain relief. She has no questions at this time. No red flag symptoms noted upon exam today.   Dr. Alvester Morin participated with direct patient care including clinical review, exam when needed and significant portion of diagnostic and treatment plan.     Meds & Orders: No orders of the defined types were placed in this encounter.   Orders Placed This Encounter  Procedures   Ambulatory referral to Physical Medicine Rehab    Follow-up: Return for Right C7-T1 interlaminar epidural steroid injection.   Procedures: No procedures performed      Clinical History: CLINICAL DATA:  Inflammatory spondyloarthropathy.  Right neck pain.   EXAM: MRI CERVICAL SPINE WITHOUT CONTRAST   TECHNIQUE: Multiplanar, multisequence MR imaging of the cervical spine was performed. No intravenous contrast  was administered.   COMPARISON:  08/28/2022 radiographs   FINDINGS: Alignment: Reversal the normal cervical lordosis with epicenter at C4-5. 2 mm  degenerative retrolisthesis at C6-7. 1.5 mm degenerative anterolisthesis at C2-3 and C3-4.   Vertebrae: Disc desiccation loss of disc height throughout the cervical spine but especially at C4-5, C5-6, and C6-7. Degenerative endplate findings especially at C4-5, C5-6, and C6-7. Small Schmorl's node along the inferior endplate of C4. Schmorl's nodes along the anterior superior endplates at C7 and T1. 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   Cord: No significant abnormal spinal cord signal is observed.   Posterior Fossa, vertebral arteries, paraspinal tissues: Unremarkable   Disc levels:   C2-3: No impingement.  Bilateral degenerative facet arthropathy   C3-4: Moderate right foraminal stenosis due to uncinate and facet spurring along with disc bulge. Central disc protrusion extends cephalad.   C4-5: Moderate to prominent left and moderate right foraminal stenosis due to disc osteophyte complex and facet arthropathy.   C5-6: Prominent left and moderate right foraminal stenosis and mild central narrowing of the thecal sac due to disc bulge, uncinate spurring, and facet arthropathy.   C6-7: Moderate left and mild right foraminal stenosis and mild central narrowing of the thecal sac due to disc osteophyte complex and facet arthropathy.   C7-T1: Borderline left foraminal stenosis due to facet spurring.   IMPRESSION: 1. Cervical spondylosis and degenerative disc disease causing prominent impingement at C5-6; moderate to prominent impingement at C4-5; moderate impingement at C3-4 and C6-7, as detailed above. 2. Reversal the normal cervical lordosis with epicenter at C4-5. 3. 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     Electronically Signed   By: Gaylyn Rong M.D.   On: 10/20/2022 13:28   She reports that she has never smoked. She has never used smokeless tobacco. No results for input(s): "HGBA1C",  "LABURIC" in the last 8760 hours.  Objective:  VS:  HT:    WT:   BMI:     BP:   HR: bpm  TEMP: ( )  RESP:  Physical Exam Vitals and nursing note reviewed.  HENT:     Head: Normocephalic and atraumatic.     Right Ear: External ear normal.     Left Ear: External ear normal.     Nose: Nose normal.     Mouth/Throat:     Mouth: Mucous membranes are moist.  Eyes:     Extraocular Movements: Extraocular movements intact.  Cardiovascular:     Rate and Rhythm: Normal rate.     Pulses: Normal pulses.  Pulmonary:     Effort: Pulmonary effort is normal.  Abdominal:     General: Abdomen is flat. There is no distension.  Musculoskeletal:        General: Tenderness present.     Cervical back: Tenderness present.     Comments: No discomfort noted with flexion, extension and side-to-side rotation. Patient has good strength in the upper extremities including 5 out of 5 strength in wrist extension, long finger flexion and APB. Shoulder range of motion is full bilaterally without any sign of impingement. There is no atrophy of the hands intrinsically. Dysesthesias noted to right C6 dermatome. Sensation intact bilaterally. Negative Hoffman's sign. Negative Spurling's sign.     Skin:    General: Skin is warm and dry.     Capillary Refill: Capillary refill takes less than 2 seconds.  Neurological:     General: No focal deficit present.  Mental Status: She is alert and oriented to person, place, and time.  Psychiatric:        Mood and Affect: Mood normal.        Behavior: Behavior normal.     Ortho Exam  Imaging: No results found.  Past Medical/Family/Surgical/Social History: Medications & Allergies reviewed per EMR, new medications updated. Patient Active Problem List   Diagnosis Date Noted   Acute pain of right shoulder 08/30/2020   Hallux rigidus, left foot 03/01/2019   Soft tissue mass 03/01/2019   Bunion 03/01/2019   Complex tear of medial meniscus of right knee 01/01/2017    Right knee pain 07/10/2016   Depression 11/11/2011   DJD (degenerative joint disease) 10/09/2011   Other and unspecified hyperlipidemia 10/09/2011   Menopause 10/09/2011   Spinal stenosis 10/09/2011   History of Bell's palsy 10/09/2011   Herpes progenitalis    Osteoarthritis    ABDOMINAL PAIN, LEFT LOWER QUADRANT 07/27/2007   BIPOLAR DISORDER UNSPECIFIED 07/26/2007   Past Medical History:  Diagnosis Date   Allergy    Anxiety    Arthritis    left great toe   Chondromalacia of knee, 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   Complex tear of medial meniscus of right knee 01/01/2017   Depression    Hyperlipidemia    Lateral meniscus tear 12/2016   right medial   Family History  Problem Relation Age of Onset   Arthritis Mother    Bipolar disorder Mother    Anxiety disorder Mother    Depression Mother    Heart failure Mother    Cancer Mother        Colon,Lung,Kidney   Colon cancer Mother 77   Heart disease Father    Hypertension Father    Cancer Father        Skin   Depression Sister    Colon polyps Brother    Heart disease Brother    Hypertension Brother    Cancer Brother        Skin   Alcohol abuse Brother    Bipolar disorder Maternal Grandmother    Alcohol abuse Maternal Grandmother    Depression Maternal Grandmother    Diabetes Maternal Grandfather    Esophageal cancer Neg Hx    Rectal cancer Neg Hx    Stomach cancer Neg Hx    Past Surgical History:  Procedure Laterality Date   CATARACT EXTRACTION W/ INTRAOCULAR LENS IMPLANT Right 03/2015   CESAREAN SECTION  09/23/2000   COLONOSCOPY     KNEE ARTHROSCOPY Right 01/01/2017   Procedure: RIGHT KNEE ARTHROSCOPY, CHONDROPLASTY AND PARTIAL MEDIAL LATERAL MENISCECTOMY;  Surgeon: Teryl Lucy, MD;  Location: [facility];  Service: Orthopedics;  Laterality: Right;   SKIN CANCER EXCISION     TUBAL LIGATION  09/23/2000   Social History   Occupational History   Not on file  Tobacco Use   Smoking status: Never   Smokeless  tobacco: Never  Vaping Use   Vaping status: Never Used  Substance and Sexual Activity   Alcohol use: Yes    Comment: 1 beer on the weekend   Drug use: No   Sexual activity: Yes    Birth control/protection: Surgical, Post-menopausal    Comment: 1st intercourse 62 yo-More than 5 partners

## 2022-10-20 NOTE — Progress Notes (Unsigned)
Functional Pain Scale - descriptive words and definitions  Distracting (5)    Aware of pain/able to complete some ADL's but limited by pain/sleep is affected and active distractions are only slightly useful. Moderate range order  Average Pain  varies  Neck pain on the right side that radiates into the upper back and right shoulder Hand pain and numbness in the left hand

## 2022-10-21 ENCOUNTER — Other Ambulatory Visit: Payer: Self-pay

## 2022-10-28 ENCOUNTER — Ambulatory Visit: Payer: Commercial Managed Care - PPO | Admitting: Physical Medicine and Rehabilitation

## 2022-10-28 ENCOUNTER — Other Ambulatory Visit: Payer: Self-pay

## 2022-10-28 VITALS — BP 151/81 | HR 85

## 2022-10-28 DIAGNOSIS — M5412 Radiculopathy, cervical region: Secondary | ICD-10-CM | POA: Diagnosis not present

## 2022-10-28 MED ORDER — METHYLPREDNISOLONE ACETATE 40 MG/ML IJ SUSP
40.0000 mg | Freq: Once | INTRAMUSCULAR | Status: AC
Start: 2022-10-28 — End: 2022-10-28
  Administered 2022-10-28: 40 mg

## 2022-10-28 NOTE — Progress Notes (Signed)
Functional Pain Scale - descriptive words and definitions  Uncomfortable (3)  Pain is present but can complete all ADL's/sleep is slightly affected and passive distraction only gives marginal relief. Mild range order  Average Pain 2-3   +Driver, -BT, -Dye Allergies.  Neck pain on right side. Pain and numbness in right hand. Pain worse in the mornings

## 2022-10-28 NOTE — Patient Instructions (Signed)

## 2022-11-06 ENCOUNTER — Other Ambulatory Visit: Payer: Self-pay

## 2022-11-06 ENCOUNTER — Other Ambulatory Visit (HOSPITAL_COMMUNITY): Payer: Self-pay

## 2022-11-06 DIAGNOSIS — M25532 Pain in left wrist: Secondary | ICD-10-CM | POA: Diagnosis not present

## 2022-11-06 DIAGNOSIS — M542 Cervicalgia: Secondary | ICD-10-CM | POA: Diagnosis not present

## 2022-11-06 DIAGNOSIS — G5602 Carpal tunnel syndrome, left upper limb: Secondary | ICD-10-CM | POA: Diagnosis not present

## 2022-11-07 ENCOUNTER — Other Ambulatory Visit (HOSPITAL_COMMUNITY): Payer: Self-pay

## 2022-11-07 ENCOUNTER — Other Ambulatory Visit: Payer: Self-pay

## 2022-11-07 MED ORDER — METHYLPHENIDATE HCL ER (OSM) 36 MG PO TBCR
36.0000 mg | EXTENDED_RELEASE_TABLET | Freq: Every day | ORAL | 0 refills | Status: DC
Start: 2022-11-06 — End: 2023-03-16
  Filled 2022-11-07 (×2): qty 30, 30d supply, fill #0

## 2022-11-11 NOTE — Procedures (Signed)
Cervical Epidural Steroid Injection - Interlaminar Approach with Fluoroscopic Guidance  Patient: Ann Mendez      Date of Birth: Jul 30, 1960 MRN: [id number] PCP: Jarrett Soho, PA-C      Visit Date: 10/28/2022   Universal Protocol:    Date/Time: 11/11/2410:30 PM  Consent Given By: the patient  Position: PRONE  Additional Comments: Vital signs were monitored before and after the procedure. Patient was prepped and draped in the usual sterile fashion. The correct patient, procedure, and site was verified.   Injection Procedure Details:   Procedure diagnoses: Radiculopathy, cervical region [M54.12]    Meds Administered:  Meds ordered this encounter  Medications   methylPREDNISolone acetate (DEPO-MEDROL) injection 40 mg     Laterality: Right  Location/Site: C7-T1  Needle: 3.5 in., 20 ga. Tuohy  Needle Placement: Paramedian epidural space  Findings:  -Comments: Excellent flow of contrast into the epidural space.  Procedure Details: Using a paramedian approach from the side mentioned above, the region overlying the inferior lamina was localized under fluoroscopic visualization and the soft tissues overlying this structure were infiltrated with 4 ml. of 1% Lidocaine without Epinephrine. A # 20 gauge, Tuohy needle was inserted into the epidural space using a paramedian approach.  The epidural space was localized using loss of resistance along with contralateral oblique bi-planar fluoroscopic views.  After negative aspirate for air, blood, and CSF, a 2 ml. volume of Isovue-250 was injected into the epidural space and the flow of contrast was observed. Radiographs were obtained for documentation purposes.   The injectate was administered into the level noted above.  Additional Comments:  The patient tolerated the procedure well Dressing: 2 x 2 sterile gauze and Band-Aid    Post-procedure details: Patient was observed during the procedure. Post-procedure instructions  were reviewed.  Patient left the clinic in stable condition.

## 2022-11-11 NOTE — Progress Notes (Signed)
Ann Mendez - 62 y.o. female MRN [id number]  Date of birth: 07-20-1960  Office Visit Note: Visit Date: 10/28/2022 PCP: Jarrett Soho, PA-C Referred by: Jarrett Soho, PA-C  Subjective: Chief Complaint  Patient presents with   Neck - Pain   HPI:  Ann Mendez is a 62 y.o. female who comes in today at the request of Ellin Goodie, FNP and Dr. Gaspar Bidding for planned Right C7-T1 Cervical Interlaminar epidural steroid injection with fluoroscopic guidance.  The patient has failed conservative care including home exercise, medications, time and activity modification.  This injection will be diagnostic and hopefully therapeutic.  Please see requesting physician notes for further details and justification.   ROS Otherwise per HPI.  Assessment & Plan: Visit Diagnoses:    ICD-10-CM   1. Radiculopathy, cervical region  M54.12 XR C-ARM NO REPORT    Epidural Steroid injection    methylPREDNISolone acetate (DEPO-MEDROL) injection 40 mg      Plan: No additional findings.   Meds & Orders:  Meds ordered this encounter  Medications   methylPREDNISolone acetate (DEPO-MEDROL) injection 40 mg    Orders Placed This Encounter  Procedures   XR C-ARM NO REPORT   Epidural Steroid injection    Follow-up: Return for visit to requesting provider as needed.   Procedures: No procedures performed  Cervical Epidural Steroid Injection - Interlaminar Approach with Fluoroscopic Guidance  Patient: Ann Mendez      Date of Birth: 06/21/1960 MRN: [id number] PCP: Jarrett Soho, PA-C      Visit Date: 10/28/2022   Universal Protocol:    Date/Time: 11/11/2410:30 PM  Consent Given By: the patient  Position: PRONE  Additional Comments: Vital signs were monitored before and after the procedure. Patient was prepped and draped in the usual sterile fashion. The correct patient, procedure, and site was verified.   Injection Procedure Details:   Procedure diagnoses:  Radiculopathy, cervical region [M54.12]    Meds Administered:  Meds ordered this encounter  Medications   methylPREDNISolone acetate (DEPO-MEDROL) injection 40 mg     Laterality: Right  Location/Site: C7-T1  Needle: 3.5 in., 20 ga. Tuohy  Needle Placement: Paramedian epidural space  Findings:  -Comments: Excellent flow of contrast into the epidural space.  Procedure Details: Using a paramedian approach from the side mentioned above, the region overlying the inferior lamina was localized under fluoroscopic visualization and the soft tissues overlying this structure were infiltrated with 4 ml. of 1% Lidocaine without Epinephrine. A # 20 gauge, Tuohy needle was inserted into the epidural space using a paramedian approach.  The epidural space was localized using loss of resistance along with contralateral oblique bi-planar fluoroscopic views.  After negative aspirate for air, blood, and CSF, a 2 ml. volume of Isovue-250 was injected into the epidural space and the flow of contrast was observed. Radiographs were obtained for documentation purposes.   The injectate was administered into the level noted above.  Additional Comments:  The patient tolerated the procedure well Dressing: 2 x 2 sterile gauze and Band-Aid    Post-procedure details: Patient was observed during the procedure. Post-procedure instructions were reviewed.  Patient left the clinic in stable condition.   Clinical History: CLINICAL DATA:  Inflammatory spondyloarthropathy.  Right neck pain.   EXAM: MRI CERVICAL SPINE WITHOUT CONTRAST   TECHNIQUE: Multiplanar, multisequence MR imaging of the cervical spine was performed. No intravenous contrast was administered.   COMPARISON:  08/28/2022 radiographs   FINDINGS: Alignment: Reversal the normal cervical lordosis with epicenter at  C4-5. 2 mm degenerative retrolisthesis at C6-7. 1.5 mm degenerative anterolisthesis at C2-3 and C3-4.   Vertebrae: Disc  desiccation loss of disc height throughout the cervical spine but especially at C4-5, C5-6, and C6-7. Degenerative endplate findings especially at C4-5, C5-6, and C6-7. Small Schmorl's node along the inferior endplate of C4. Schmorl's nodes along the anterior superior endplates at C7 and T1. 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   Cord: No significant abnormal spinal cord signal is observed.   Posterior Fossa, vertebral arteries, paraspinal tissues: Unremarkable   Disc levels:   C2-3: No impingement.  Bilateral degenerative facet arthropathy   C3-4: Moderate right foraminal stenosis due to uncinate and facet spurring along with disc bulge. Central disc protrusion extends cephalad.   C4-5: Moderate to prominent left and moderate right foraminal stenosis due to disc osteophyte complex and facet arthropathy.   C5-6: Prominent left and moderate right foraminal stenosis and mild central narrowing of the thecal sac due to disc bulge, uncinate spurring, and facet arthropathy.   C6-7: Moderate left and mild right foraminal stenosis and mild central narrowing of the thecal sac due to disc osteophyte complex and facet arthropathy.   C7-T1: Borderline left foraminal stenosis due to facet spurring.   IMPRESSION: 1. Cervical spondylosis and degenerative disc disease causing prominent impingement at C5-6; moderate to prominent impingement at C4-5; moderate impingement at C3-4 and C6-7, as detailed above. 2. Reversal the normal cervical lordosis with epicenter at C4-5. 3. 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     Electronically Signed   By: Gaylyn Rong M.D.   On: 10/20/2022 13:28     Objective:  VS:  HT:    WT:   BMI:     BP:(!) 151/81  HR:85bpm  TEMP: ( )  RESP:  Physical Exam Vitals and nursing note reviewed.  Constitutional:      General: She is not in acute distress.    Appearance: Normal  appearance. She is not ill-appearing.  HENT:     Head: Normocephalic and atraumatic.     Right Ear: External ear normal.     Left Ear: External ear normal.  Eyes:     Extraocular Movements: Extraocular movements intact.  Cardiovascular:     Rate and Rhythm: Normal rate.     Pulses: Normal pulses.  Musculoskeletal:     Cervical back: Tenderness present. No rigidity.     Right lower leg: No edema.     Left lower leg: No edema.     Comments: Patient has good strength in the upper extremities including 5 out of 5 strength in wrist extension long finger flexion and APB.  There is no atrophy of the hands intrinsically.  There is a negative Hoffmann's test.   Lymphadenopathy:     Cervical: No cervical adenopathy.  Skin:    Findings: No erythema, lesion or rash.  Neurological:     General: No focal deficit present.     Mental Status: She is alert and oriented to person, place, and time.     Sensory: No sensory deficit.     Motor: No weakness or abnormal muscle tone.     Coordination: Coordination normal.  Psychiatric:        Mood and Affect: Mood normal.        Behavior: Behavior normal.      Imaging: No results found.

## 2022-11-20 DIAGNOSIS — M9907 Segmental and somatic dysfunction of upper extremity: Secondary | ICD-10-CM | POA: Diagnosis not present

## 2022-11-20 DIAGNOSIS — M25532 Pain in left wrist: Secondary | ICD-10-CM | POA: Diagnosis not present

## 2022-11-20 DIAGNOSIS — G5612 Other lesions of median nerve, left upper limb: Secondary | ICD-10-CM | POA: Diagnosis not present

## 2022-11-20 DIAGNOSIS — M542 Cervicalgia: Secondary | ICD-10-CM | POA: Diagnosis not present

## 2022-11-20 DIAGNOSIS — M9908 Segmental and somatic dysfunction of rib cage: Secondary | ICD-10-CM | POA: Diagnosis not present

## 2022-11-20 DIAGNOSIS — M9901 Segmental and somatic dysfunction of cervical region: Secondary | ICD-10-CM | POA: Diagnosis not present

## 2022-11-24 ENCOUNTER — Telehealth: Payer: Self-pay | Admitting: *Deleted

## 2022-11-24 NOTE — Telephone Encounter (Signed)
Patient left message requesting return call to discuss vaginal estrogen. Using as directed, does not think medication is being absorbed. Request f/u to discuss.   Routing to [employer]

## 2022-11-25 NOTE — Telephone Encounter (Signed)
Often the inactive ingredients will be expelled the next morning. If she does not feel she is getting the benefits she hoped out of use we can switch to estradiol vaginal inserts twice a week as an alternative.

## 2022-11-25 NOTE — Telephone Encounter (Signed)
Spoke w/ pt and she reports that she feels as if the same (if not close) to the same amt that she inserts the night before bed (2gms/ 0.5 an applicator) is being expelled when she uses the restroom in the AM and is concerned that none of the cream is being absorbed.  Denies any concerns with vulvar/vaginal sxs at this time.   Please advise.

## 2022-11-25 NOTE — Telephone Encounter (Signed)
LVMTCB

## 2022-11-25 NOTE — Telephone Encounter (Signed)
Pt notified and voiced understanding. Will continue using as instructed for now and will contact us if ever she has concerns with symptoms. Routing to provider for final review and closing encounter.

## 2022-11-28 ENCOUNTER — Other Ambulatory Visit: Payer: Self-pay

## 2022-11-28 ENCOUNTER — Other Ambulatory Visit (HOSPITAL_COMMUNITY): Payer: Self-pay

## 2022-11-28 MED ORDER — SODIUM CHLORIDE 0.9 % IN NEBU
INHALATION_SOLUTION | RESPIRATORY_TRACT | 13 refills | Status: DC
Start: 2022-11-28 — End: 2023-04-22
  Filled 2022-11-28: qty 90, 90d supply, fill #0
  Filled 2022-12-01: qty 300, 30d supply, fill #0

## 2022-12-01 ENCOUNTER — Other Ambulatory Visit: Payer: Self-pay

## 2022-12-02 ENCOUNTER — Other Ambulatory Visit (HOSPITAL_COMMUNITY): Payer: Self-pay

## 2022-12-02 DIAGNOSIS — F411 Generalized anxiety disorder: Secondary | ICD-10-CM | POA: Diagnosis not present

## 2022-12-02 MED ORDER — METHYLPHENIDATE HCL ER (OSM) 36 MG PO TBCR
36.0000 mg | EXTENDED_RELEASE_TABLET | Freq: Every day | ORAL | 0 refills | Status: DC
  Filled 2022-12-08: qty 30, 30d supply, fill #0

## 2022-12-08 ENCOUNTER — Other Ambulatory Visit (HOSPITAL_COMMUNITY): Payer: Self-pay

## 2022-12-08 ENCOUNTER — Other Ambulatory Visit: Payer: Self-pay

## 2022-12-15 DIAGNOSIS — M79652 Pain in left thigh: Secondary | ICD-10-CM | POA: Diagnosis not present

## 2022-12-15 DIAGNOSIS — M9901 Segmental and somatic dysfunction of cervical region: Secondary | ICD-10-CM | POA: Diagnosis not present

## 2022-12-15 DIAGNOSIS — M25552 Pain in left hip: Secondary | ICD-10-CM | POA: Diagnosis not present

## 2022-12-15 DIAGNOSIS — M542 Cervicalgia: Secondary | ICD-10-CM | POA: Diagnosis not present

## 2022-12-18 ENCOUNTER — Other Ambulatory Visit (HOSPITAL_COMMUNITY): Payer: Self-pay

## 2022-12-22 ENCOUNTER — Other Ambulatory Visit (HOSPITAL_COMMUNITY): Payer: Self-pay

## 2022-12-31 ENCOUNTER — Other Ambulatory Visit (HOSPITAL_COMMUNITY): Payer: Self-pay

## 2022-12-31 MED ORDER — METHYLPHENIDATE HCL ER (OSM) 36 MG PO TBCR
36.0000 mg | EXTENDED_RELEASE_TABLET | Freq: Every day | ORAL | 0 refills | Status: DC
  Filled 2023-01-06: qty 30, 30d supply, fill #0

## 2023-01-04 DIAGNOSIS — R5383 Other fatigue: Secondary | ICD-10-CM | POA: Diagnosis not present

## 2023-01-06 ENCOUNTER — Other Ambulatory Visit: Payer: Self-pay

## 2023-01-06 ENCOUNTER — Other Ambulatory Visit (HOSPITAL_COMMUNITY): Payer: Self-pay

## 2023-01-15 ENCOUNTER — Other Ambulatory Visit (HOSPITAL_COMMUNITY): Payer: Self-pay

## 2023-01-27 DIAGNOSIS — F411 Generalized anxiety disorder: Secondary | ICD-10-CM | POA: Diagnosis not present

## 2023-01-28 ENCOUNTER — Other Ambulatory Visit (HOSPITAL_COMMUNITY): Payer: Self-pay

## 2023-01-28 MED ORDER — METHYLPHENIDATE HCL ER (OSM) 36 MG PO TBCR
36.0000 mg | EXTENDED_RELEASE_TABLET | Freq: Every day | ORAL | 0 refills | Status: DC
  Filled 2023-01-28 – 2023-02-06 (×2): qty 30, 30d supply, fill #0

## 2023-01-29 ENCOUNTER — Other Ambulatory Visit (HOSPITAL_COMMUNITY): Payer: Self-pay

## 2023-02-06 ENCOUNTER — Other Ambulatory Visit (HOSPITAL_COMMUNITY): Payer: Self-pay

## 2023-02-06 ENCOUNTER — Other Ambulatory Visit: Payer: Self-pay

## 2023-02-10 ENCOUNTER — Other Ambulatory Visit (HOSPITAL_COMMUNITY): Payer: Self-pay

## 2023-02-11 DIAGNOSIS — G4700 Insomnia, unspecified: Secondary | ICD-10-CM | POA: Diagnosis not present

## 2023-02-11 DIAGNOSIS — G5603 Carpal tunnel syndrome, bilateral upper limbs: Secondary | ICD-10-CM | POA: Diagnosis not present

## 2023-02-11 DIAGNOSIS — M542 Cervicalgia: Secondary | ICD-10-CM | POA: Diagnosis not present

## 2023-02-11 DIAGNOSIS — M25572 Pain in left ankle and joints of left foot: Secondary | ICD-10-CM | POA: Diagnosis not present

## 2023-02-11 DIAGNOSIS — M9901 Segmental and somatic dysfunction of cervical region: Secondary | ICD-10-CM | POA: Diagnosis not present

## 2023-02-12 ENCOUNTER — Other Ambulatory Visit (HOSPITAL_BASED_OUTPATIENT_CLINIC_OR_DEPARTMENT_OTHER): Payer: Self-pay

## 2023-02-12 ENCOUNTER — Other Ambulatory Visit (HOSPITAL_COMMUNITY): Payer: Self-pay

## 2023-02-12 MED ORDER — DICLOFENAC SODIUM 75 MG PO TBEC
75.0000 mg | DELAYED_RELEASE_TABLET | Freq: Every day | ORAL | 1 refills | Status: DC
  Filled 2023-02-12: qty 30, 30d supply, fill #0
  Filled 2023-06-21: qty 30, 30d supply, fill #1

## 2023-02-17 ENCOUNTER — Other Ambulatory Visit (HOSPITAL_COMMUNITY): Payer: Self-pay

## 2023-03-02 DIAGNOSIS — R5383 Other fatigue: Secondary | ICD-10-CM | POA: Diagnosis not present

## 2023-03-02 DIAGNOSIS — R6883 Chills (without fever): Secondary | ICD-10-CM | POA: Diagnosis not present

## 2023-03-02 DIAGNOSIS — Z03818 Encounter for observation for suspected exposure to other biological agents ruled out: Secondary | ICD-10-CM | POA: Diagnosis not present

## 2023-03-02 DIAGNOSIS — Z20828 Contact with and (suspected) exposure to other viral communicable diseases: Secondary | ICD-10-CM | POA: Diagnosis not present

## 2023-03-02 DIAGNOSIS — Z6820 Body mass index (BMI) 20.0-20.9, adult: Secondary | ICD-10-CM | POA: Diagnosis not present

## 2023-03-03 ENCOUNTER — Other Ambulatory Visit: Payer: Self-pay | Admitting: Obstetrics and Gynecology

## 2023-03-03 DIAGNOSIS — N952 Postmenopausal atrophic vaginitis: Secondary | ICD-10-CM

## 2023-03-03 MED ORDER — ESTRADIOL 0.1 MG/GM VA CREA
2.0000 g | TOPICAL_CREAM | VAGINAL | 0 refills | Status: DC
Start: 2023-03-05 — End: 2023-06-02
  Filled 2023-03-03: qty 42.5, 74d supply, fill #0

## 2023-03-03 NOTE — Telephone Encounter (Signed)
Med refill request: estradiol 0.1 mg vaginal cream Last AEX: 03/10/22 Dr. Seymour Bars Next AEX: 03/16/23 Dr. Karma Greaser Last MMG (if hormonal med) 04/29/22 BI-RADS 1 negative Refill authorized: estradiol 0.1 mg vaginal cream 42.5 gram #1.  Please approve or deny as appropriate.

## 2023-03-04 ENCOUNTER — Other Ambulatory Visit: Payer: Self-pay

## 2023-03-04 ENCOUNTER — Other Ambulatory Visit (HOSPITAL_COMMUNITY): Payer: Self-pay

## 2023-03-05 ENCOUNTER — Telehealth: Payer: Self-pay | Admitting: Gastroenterology

## 2023-03-05 NOTE — Telephone Encounter (Signed)
Inbound call from patient stating that she needed to schedule a colonoscopy. She states for the last week she has been having a slight pain on the left side\ middle of her abdomen with chills. Patient states that she went to her PCP and was tested for Flu, COVID and RSV and all were negative. Patient is requesting a call to discuss symptoms and to see what the next steps are. Please advise.

## 2023-03-05 NOTE — Telephone Encounter (Signed)
Spoke with the pt and discussed that she is having some abd discomfort. She is due for colon but would like to see Dr Meridee Score to discuss symptoms prior to making colon appt.  I have her set up to see Dr Meridee Score on 4/9.

## 2023-03-06 ENCOUNTER — Other Ambulatory Visit: Payer: Self-pay

## 2023-03-06 ENCOUNTER — Other Ambulatory Visit (HOSPITAL_COMMUNITY): Payer: Self-pay

## 2023-03-06 DIAGNOSIS — R1013 Epigastric pain: Secondary | ICD-10-CM | POA: Diagnosis not present

## 2023-03-06 DIAGNOSIS — R6883 Chills (without fever): Secondary | ICD-10-CM | POA: Diagnosis not present

## 2023-03-06 DIAGNOSIS — R630 Anorexia: Secondary | ICD-10-CM | POA: Diagnosis not present

## 2023-03-06 DIAGNOSIS — R5383 Other fatigue: Secondary | ICD-10-CM | POA: Diagnosis not present

## 2023-03-06 DIAGNOSIS — F411 Generalized anxiety disorder: Secondary | ICD-10-CM | POA: Diagnosis not present

## 2023-03-06 MED ORDER — METHYLPHENIDATE HCL ER (OSM) 36 MG PO TBCR
36.0000 mg | EXTENDED_RELEASE_TABLET | Freq: Every day | ORAL | 0 refills | Status: DC
  Filled 2023-03-06: qty 30, 30d supply, fill #0

## 2023-03-16 ENCOUNTER — Ambulatory Visit (INDEPENDENT_AMBULATORY_CARE_PROVIDER_SITE_OTHER): Payer: Commercial Managed Care - PPO | Admitting: Obstetrics and Gynecology

## 2023-03-16 VITALS — BP 122/72 | HR 66 | Ht 61.0 in | Wt 114.0 lb

## 2023-03-16 DIAGNOSIS — Z9189 Other specified personal risk factors, not elsewhere classified: Secondary | ICD-10-CM | POA: Diagnosis not present

## 2023-03-16 DIAGNOSIS — E2839 Other primary ovarian failure: Secondary | ICD-10-CM

## 2023-03-16 DIAGNOSIS — B009 Herpesviral infection, unspecified: Secondary | ICD-10-CM

## 2023-03-16 DIAGNOSIS — Z01419 Encounter for gynecological examination (general) (routine) without abnormal findings: Secondary | ICD-10-CM

## 2023-03-16 DIAGNOSIS — N952 Postmenopausal atrophic vaginitis: Secondary | ICD-10-CM | POA: Diagnosis not present

## 2023-03-16 DIAGNOSIS — Z1331 Encounter for screening for depression: Secondary | ICD-10-CM

## 2023-03-16 DIAGNOSIS — Z1231 Encounter for screening mammogram for malignant neoplasm of breast: Secondary | ICD-10-CM

## 2023-03-16 MED ORDER — INTRAROSA 6.5 MG VA INST: 1.0000 | VAGINAL_INSERT | Freq: Every evening | VAGINAL | 12 refills | Status: DC | PRN

## 2023-03-16 NOTE — Progress Notes (Signed)
 63 y.o. y.o. female here for annual exam. Patient's last menstrual period was 10/16/2011.     [id number] Issac and Camile Remarried to second husband now for 33 years    RP:  Established patient presenting for annual gyn exam   No PMB  HPI: Postmenopausal/atrophic genital changes. Would like to change Premarin cream twice weekly. She is not had any vaginal bleeding. No pelvic pain.  No pain with IC.  Pap smear /HPV Neg in 1/23 repeat in 2028 and can stop.  Remote abnormal with colposcopy.  No significant history of abnormal Pap smears.  Breasts normal.  Mammogram 04/2021 Neg. Colonoscopy 2020 repeat this year scheduled.  DEXA 11/2018 normal repeat ordered.   Health labs with Fam MD.  Has had the Flu vaccine. Would like something else for the vaginal dryness and dyspareunia. Has tried vaginal pills and creams.  Intrarosa sent in. Body mass index is 21.54 kg/m.     03/16/2023    9:41 AM 01/28/2016   10:51 AM 12/03/2011    1:14 PM  Depression screen PHQ 2/9  Decreased Interest 0 0 2  Down, Depressed, Hopeless 0 0 2  PHQ - 2 Score 0 0 4  Altered sleeping   0  Tired, decreased energy   2  Change in appetite   3  Feeling bad or failure about yourself    1  Trouble concentrating   1  Moving slowly or fidgety/restless   0  Suicidal thoughts   0  PHQ-9 Score   11    Blood pressure 122/72, pulse 66, height 5\' 1"  (1.549 m), weight 114 lb (51.7 kg), last menstrual period 10/16/2011, SpO2 100%.     Component Value Date/Time   DIAGPAP  01/23/2021 1051    - Negative for intraepithelial lesion or malignancy (NILM)   ADEQPAP  01/23/2021 1051    Satisfactory for evaluation; transformation zone component PRESENT.    GYN HISTORY:    Component Value Date/Time   DIAGPAP  01/23/2021 1051    - Negative for intraepithelial lesion or malignancy (NILM)   ADEQPAP  01/23/2021 1051    Satisfactory for evaluation; transformation zone component PRESENT.    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   Complex tear of medial meniscus of right knee 01/01/2017   Depression    Hyperlipidemia    Lateral meniscus tear 12/2016   right medial    Past Surgical History:  Procedure Laterality Date   CATARACT EXTRACTION W/ INTRAOCULAR LENS IMPLANT Right 03/2015   CESAREAN SECTION  09/23/2000   COLONOSCOPY     KNEE ARTHROSCOPY Right 01/01/2017   Procedure: RIGHT KNEE ARTHROSCOPY, CHONDROPLASTY AND PARTIAL MEDIAL LATERAL MENISCECTOMY;  Surgeon: Teryl Lucy, MD;  Location: [facility];  Service: Orthopedics;  Laterality: Right;   SKIN CANCER EXCISION     TUBAL LIGATION  09/23/2000    Current Outpatient Medications on File Prior to Visit  Medication Sig Dispense Refill   acyclovir (ZOVIRAX) 200 MG capsule Take 1 capsule (200 mg total) by mouth 2 (two) times daily. 60 capsule 4   b complex vitamins tablet Take 1 tablet by mouth daily.     buPROPion (WELLBUTRIN XL) 150 MG 24 hr tablet Take 1 tablet by mouth once daily 90 tablet 5   buPROPion (WELLBUTRIN XL) 150 MG 24 hr tablet Take 1 tablet (150 mg total) by mouth daily. 90 tablet 6   cholecalciferol (VITAMIN D) 1000 UNITS tablet Take 2,000 Units by mouth daily.      estradiol (ESTRACE VAGINAL) 0.1 MG/GM vaginal cream Place 2 grams vaginally twice weekly as directed 42.5 g 0   gabapentin (NEURONTIN) 300 MG capsule Take 1 capsule (300 mg total) by mouth 3 (three) times daily as needed. 90 capsule 0   levothyroxine (SYNTHROID) 25 MCG tablet Take 1 tablet (25 mcg total) by mouth in the morning on an empty stomach 90 tablet 3   Lidocaine-Hydrocort, Perianal, 3-0.5 % CREA  Apply thin film locally to affected area up to 3 times daily as needed, avoid intravaginal application 85 g 0   methylphenidate 36 MG PO CR tablet Take 1 tablet (36 mg total) by mouth daily. 30 tablet 0   methylphenidate 36 MG PO CR tablet Take 1 tablet (36 mg total) by mouth daily. 30 tablet 0   naloxone (NARCAN) nasal spray 4 mg/0.1 mL Call 911. Administer a single spray in one nostril. If no to minimal response after 2-3 minutes, an additional dose may be given in the alternate nostril. 2 each PRN   rosuvastatin (CRESTOR) 5 MG tablet Take 1 tablet (5 mg total) by mouth daily. 90 tablet 4   sertraline (ZOLOFT) 100 MG tablet Take 2 tablets (200 mg total) by mouth daily. 180 tablet 5   sodium chloride 0.9 % nebulizer solution use as directed for insertion of scleral contact lenses 90 mL 13   tiZANidine (ZANAFLEX) 4 MG tablet Take 1 tablet (4 mg total) by mouth every 8 (eight) hours as needed. 90 tablet 0   diclofenac (VOLTAREN) 75 MG EC tablet Take 1 tablet by mouth daily (Patient not taking: Reported on 03/16/2023) 30 tablet 1   No current facility-administered medications on file prior to visit.    Social History   Socioeconomic History   Marital status: Married    Spouse name: Not on file   Number of children: Not on file   Years of education: Not on file   Highest education level: Not on file  Occupational History   Not on file  Tobacco Use   Smoking status: Never   Smokeless tobacco: Never  Vaping Use   Vaping status: Never Used  Substance and Sexual Activity   Alcohol use: Yes    Comment: 1 beer on the weekend   Drug use: No   Sexual activity: Yes    Birth control/protection: Surgical, Post-menopausal    Comment: 1st intercourse 63 yo-More than 5 partners  Other Topics Concern   Not on file  Social History Narrative   Not on file   Social Drivers of Health   Financial Resource Strain: Not on file  Food Insecurity: Not on file  Transportation Needs: Not on file   Physical Activity: Not on file  Stress:  Not on file  Social Connections: Not on file  Intimate Partner Violence: Not on file    Family History  Problem Relation Age of Onset   Arthritis Mother    Bipolar disorder Mother    Anxiety disorder Mother    Depression Mother    Heart failure Mother    Cancer Mother        Colon,Lung,Kidney   Colon cancer Mother 68   Heart disease Father    Hypertension Father    Cancer Father        Skin   Depression Sister    Colon polyps Brother    Heart disease Brother    Hypertension Brother    Cancer Brother        Skin   Alcohol abuse Brother    Bipolar disorder Maternal Grandmother    Alcohol abuse Maternal Grandmother    Depression Maternal Grandmother    Diabetes Maternal Grandfather    Esophageal cancer Neg Hx    Rectal cancer Neg Hx    Stomach cancer Neg Hx      Allergies  Allergen Reactions   Stadol [Butorphanol] Other (See Comments)    Patient reports received in 1998, increased symptoms of HELLP   Sulfa Antibiotics Other (See Comments)    UNKNOWN      Patient's last menstrual period was Patient's last menstrual period was 10/16/2011.Marland Kitchen            Review of Systems Alls systems reviewed and are negative.     Physical Exam Constitutional:      Appearance: Normal appearance.  Genitourinary:     Vulva and urethral meatus normal.     No lesions in the vagina.     Right Labia: No rash, lesions or skin changes.    Left Labia: No lesions, skin changes or rash.    No vaginal discharge or tenderness.     No vaginal prolapse present.    Moderate vaginal atrophy present.     Right Adnexa: not tender, not palpable and no mass present.    Left Adnexa: not tender, not palpable and no mass present.    No cervical motion tenderness or discharge.     Uterus is not enlarged, tender or irregular.  Breasts:    Right: Normal.     Left: Normal.  HENT:     Head: Normocephalic.  Neck:     Thyroid: No thyroid mass, thyromegaly  or thyroid tenderness.  Cardiovascular:     Rate and Rhythm: Normal rate and regular rhythm.     Heart sounds: Normal heart sounds, S1 normal and S2 normal.  Pulmonary:     Effort: Pulmonary effort is normal.     Breath sounds: Normal breath sounds and air entry.  Abdominal:     General: There is no distension.     Palpations: Abdomen is soft. There is no mass.     Tenderness: There is no abdominal tenderness. There is no guarding or rebound.  Musculoskeletal:        General: Normal range of motion.     Cervical back: Full passive range of motion without pain, normal range of motion and neck supple. No tenderness.     Right lower leg: No edema.     Left lower leg: No edema.  Neurological:     Mental Status: She is alert.  Skin:    General: Skin is warm.  Psychiatric:        Mood and Affect: Mood normal.  Behavior: Behavior normal.        Thought Content: Thought content normal.  Vitals and nursing note reviewed. Exam conducted with a chaperone present.    Exam performed with Carollee Herter, MA   A:         [facility] GYN exam                             P:        Pap smear not indicated Encouraged annual mammogram screening Colon cancer screening up-to-date DXA ordered today Labs and immunizations to do with PMD Encouraged healthy lifestyle practices Encouraged Vit D and Calcium   No follow-ups on file.  Earley Favor

## 2023-03-17 ENCOUNTER — Other Ambulatory Visit (HOSPITAL_COMMUNITY): Payer: Self-pay

## 2023-03-18 ENCOUNTER — Telehealth: Payer: Self-pay

## 2023-03-18 DIAGNOSIS — R202 Paresthesia of skin: Secondary | ICD-10-CM | POA: Diagnosis not present

## 2023-03-18 DIAGNOSIS — M25531 Pain in right wrist: Secondary | ICD-10-CM | POA: Diagnosis not present

## 2023-03-18 DIAGNOSIS — R600 Localized edema: Secondary | ICD-10-CM | POA: Diagnosis not present

## 2023-03-18 DIAGNOSIS — L821 Other seborrheic keratosis: Secondary | ICD-10-CM | POA: Diagnosis not present

## 2023-03-18 DIAGNOSIS — M25532 Pain in left wrist: Secondary | ICD-10-CM | POA: Diagnosis not present

## 2023-03-18 NOTE — Telephone Encounter (Signed)
 Prior authorization needed for intrarosa 6.5mg . KEY: [id number]. Patient aware it could take several days for a response.

## 2023-03-18 NOTE — Telephone Encounter (Signed)
PA was approved. Patient aware

## 2023-03-19 ENCOUNTER — Other Ambulatory Visit (HOSPITAL_COMMUNITY): Payer: Self-pay

## 2023-03-19 ENCOUNTER — Other Ambulatory Visit: Payer: Self-pay

## 2023-03-19 ENCOUNTER — Telehealth: Payer: Self-pay | Admitting: *Deleted

## 2023-03-19 MED ORDER — INTRAROSA 6.5 MG VA INST
1.0000 | VAGINAL_INSERT | Freq: Every evening | VAGINAL | 12 refills | Status: AC | PRN
  Filled 2023-03-19 – 2023-03-20 (×2): qty 28, 28d supply, fill #0

## 2023-03-19 NOTE — Telephone Encounter (Signed)
 Patient left message requesting [facility] Rx be transferred to [facility]. Patient states she spoke with [employer] and medication will be covered at pharmacy of patients choice.   Rx sent to [facility].   Patient aware to call if any additional assistance needed.   Routing to provider for final review. Patient is agreeable to disposition. Will close encounter.

## 2023-03-20 ENCOUNTER — Other Ambulatory Visit (HOSPITAL_COMMUNITY): Payer: Self-pay

## 2023-03-20 ENCOUNTER — Other Ambulatory Visit: Payer: Self-pay

## 2023-03-21 ENCOUNTER — Other Ambulatory Visit (HOSPITAL_BASED_OUTPATIENT_CLINIC_OR_DEPARTMENT_OTHER): Payer: Self-pay

## 2023-03-24 ENCOUNTER — Other Ambulatory Visit (HOSPITAL_COMMUNITY): Payer: Self-pay

## 2023-03-24 DIAGNOSIS — F411 Generalized anxiety disorder: Secondary | ICD-10-CM | POA: Diagnosis not present

## 2023-03-25 ENCOUNTER — Other Ambulatory Visit (HOSPITAL_COMMUNITY): Payer: Self-pay

## 2023-03-25 ENCOUNTER — Encounter: Admitting: Physical Medicine and Rehabilitation

## 2023-03-27 ENCOUNTER — Telehealth: Payer: Self-pay | Admitting: Physical Medicine and Rehabilitation

## 2023-03-27 NOTE — Telephone Encounter (Signed)
 Pt called to reschedule a canceled appointment with [facility]. Nerve conduction study.

## 2023-03-28 ENCOUNTER — Other Ambulatory Visit (HOSPITAL_COMMUNITY): Payer: Self-pay

## 2023-03-28 MED ORDER — METHYLPHENIDATE HCL ER (OSM) 36 MG PO TBCR
36.0000 mg | EXTENDED_RELEASE_TABLET | Freq: Every day | ORAL | 0 refills | Status: DC
Start: 2023-03-27 — End: 2023-05-12
  Filled 2023-04-02: qty 30, 30d supply, fill #0

## 2023-04-02 ENCOUNTER — Other Ambulatory Visit: Payer: Self-pay

## 2023-04-02 ENCOUNTER — Other Ambulatory Visit (HOSPITAL_COMMUNITY): Payer: Self-pay

## 2023-04-10 ENCOUNTER — Other Ambulatory Visit (HOSPITAL_COMMUNITY): Payer: Self-pay

## 2023-04-13 ENCOUNTER — Other Ambulatory Visit (HOSPITAL_COMMUNITY): Payer: Self-pay

## 2023-04-13 ENCOUNTER — Other Ambulatory Visit: Payer: Self-pay

## 2023-04-13 MED ORDER — ROSUVASTATIN CALCIUM 5 MG PO TABS
5.0000 mg | ORAL_TABLET | Freq: Every day | ORAL | 1 refills | Status: DC
Start: 2023-04-13 — End: 2023-04-22
  Filled 2023-04-13: qty 90, 90d supply, fill #0

## 2023-04-14 ENCOUNTER — Other Ambulatory Visit (HOSPITAL_COMMUNITY): Payer: Self-pay

## 2023-04-14 MED ORDER — ROSUVASTATIN CALCIUM 5 MG PO TABS
5.0000 mg | ORAL_TABLET | Freq: Every day | ORAL | 1 refills | Status: AC
Start: 2023-04-14 — End: ?
  Filled 2023-04-14 – 2023-08-05 (×2): qty 90, 90d supply, fill #0

## 2023-04-15 ENCOUNTER — Other Ambulatory Visit: Payer: Self-pay

## 2023-04-15 ENCOUNTER — Other Ambulatory Visit (HOSPITAL_COMMUNITY): Payer: Self-pay

## 2023-04-15 MED ORDER — SERTRALINE HCL 100 MG PO TABS
200.0000 mg | ORAL_TABLET | Freq: Every day | ORAL | 5 refills | Status: DC
  Filled 2023-04-15: qty 180, 90d supply, fill #0

## 2023-04-21 ENCOUNTER — Other Ambulatory Visit (HOSPITAL_COMMUNITY): Payer: Self-pay

## 2023-04-21 DIAGNOSIS — F411 Generalized anxiety disorder: Secondary | ICD-10-CM | POA: Diagnosis not present

## 2023-04-21 MED ORDER — SERTRALINE HCL 100 MG PO TABS
200.0000 mg | ORAL_TABLET | Freq: Every day | ORAL | 5 refills | Status: AC
  Filled 2023-07-13: qty 180, 90d supply, fill #0
  Filled 2023-10-09: qty 180, 90d supply, fill #1
  Filled 2024-01-08 (×2): qty 180, 90d supply, fill #2

## 2023-04-21 MED ORDER — METHYLPHENIDATE HCL ER (OSM) 27 MG PO TBCR
27.0000 mg | EXTENDED_RELEASE_TABLET | Freq: Every day | ORAL | 0 refills | Status: DC
  Filled 2023-04-21: qty 30, 30d supply, fill #0

## 2023-04-22 ENCOUNTER — Other Ambulatory Visit (HOSPITAL_COMMUNITY): Payer: Self-pay

## 2023-04-22 ENCOUNTER — Ambulatory Visit: Payer: Commercial Managed Care - PPO | Admitting: Gastroenterology

## 2023-04-22 ENCOUNTER — Other Ambulatory Visit: Payer: Self-pay

## 2023-04-22 ENCOUNTER — Encounter: Payer: Self-pay | Admitting: Gastroenterology

## 2023-04-22 VITALS — BP 118/70 | HR 57 | Ht 61.0 in | Wt 110.0 lb

## 2023-04-22 DIAGNOSIS — K5900 Constipation, unspecified: Secondary | ICD-10-CM

## 2023-04-22 DIAGNOSIS — Z860101 Personal history of adenomatous and serrated colon polyps: Secondary | ICD-10-CM

## 2023-04-22 MED ORDER — SUTAB 1479-225-188 MG PO TABS
24.0000 | ORAL_TABLET | ORAL | 0 refills | Status: DC
  Filled 2023-04-22 – 2023-04-23 (×2): qty 24, 1d supply, fill #0

## 2023-04-22 NOTE — Progress Notes (Signed)
 GASTROENTEROLOGY OUTPATIENT CLINIC VISIT   Primary Care Provider Darnelle Elders, PA-C [address] [place] [address] [phone]  Referring Provider Darnelle Elders, PA-C [address] [place],  [place] [address] [phone]  Patient Profile: Ann Mendez is a 63 y.o. female with a pmh significant for hyperlipidemia, arthritis, anxiety, MDD, allergies, colon polyps (TA's), family history colon cancer (mother).  The patient presents to the [facility] for an evaluation and management of problem(s) noted below:  Problem List 1. Hx of adenomatous colonic polyps   2. Constipation, unspecified constipation type    Discussed the use of AI scribe software for clinical note transcription with the patient, who gave verbal consent to proceed.  History of Present Illness The patient returns to the [facility] GI clinic for follow-up.  I last saw this patient at the time of her colonoscopy.  She is due for colonoscopy recall this year as a result of history of adenomas.  She wonders if a different bowel preparation is available.  She also wonders whether she may be a candidate for the new Colorectal Cancer blood test that has been recently announced.  She experiences bowel movements approximately four times a week with harder stools at times as well.  She uses half a Senna tablet every third night to aid bowel movements, which helps her go the next day.  She drinks a lot of water to assist with bowel movements as well.  She has a history of hemorrhoids due to straining but denies overt rectal bleeding.  She wonders if Calcium supplements may make her constipation worse.  She also asks about fiber intake.  No other significant GI issues at this time.   GI Review of Systems Positive as above Negative for dysphagia, odynophagia, nausea, vomiting, melena, hematochezia  Review of Systems General: Denies fevers/chills/weight loss unintentionally Cardiovascular:  Denies chest pain Pulmonary: Denies shortness of breath Gastroenterological: See HPI Genitourinary: Denies darkened urine Hematological: Denies easy bruising/bleeding Endocrine: Denies temperature intolerance Dermatological: Denies jaundice Psychological: Mood is stable   Medications Current Outpatient Medications  Medication Sig Dispense Refill   acyclovir (ZOVIRAX) 200 MG capsule Take 1 capsule (200 mg total) by mouth 2 (two) times daily. (Patient taking differently: Take 200 mg by mouth 2 (two) times daily. AS NEEDED) 60 capsule 4   buPROPion (WELLBUTRIN XL) 150 MG 24 hr tablet Take 1 tablet (150 mg total) by mouth daily. 90 tablet 6   cholecalciferol (VITAMIN D) 1000 UNITS tablet Take 2,000 Units by mouth daily.      diclofenac (VOLTAREN) 75 MG EC tablet Take 1 tablet by mouth daily (Patient taking differently: Take 75 mg by mouth. Every third day) 30 tablet 1   estradiol (ESTRACE VAGINAL) 0.1 MG/GM vaginal cream Place 2 grams vaginally twice weekly as directed 42.5 g 0   levothyroxine (SYNTHROID) 25 MCG tablet Take 1 tablet (25 mcg total) by mouth in the morning on an empty stomach 90 tablet 3   Lidocaine-Hydrocort, Perianal, 3-0.5 % CREA Apply thin film locally to affected area up to 3 times daily as needed, avoid intravaginal application 85 g 0   methylphenidate 27 MG PO CR tablet Take 1 tablet (27 mg total) by mouth daily. 30 tablet 0   OVER THE COUNTER MEDICATION Hair/skin/nails ([employer] brand) 2,500 mcg biotin One daily     Prasterone (INTRAROSA) 6.5 MG INST Place 1 suppository vaginally at bedtime as needed. 30 each 12   rosuvastatin (CRESTOR) 5 MG tablet Take 1 tablet (  5 mg total) by mouth daily. 90 tablet 1   sertraline (ZOLOFT) 100 MG tablet Take 2 tablets (200 mg total) by mouth daily. 180 tablet 5   Sodium Sulfate-Mag Sulfate-KCl (SUTAB) 1479-225-188 MG TABS Take 24 tablets by mouth as directed. 24 tablet 0   methylphenidate 36 MG PO CR tablet Take 1 tablet (36 mg  total) by mouth daily. 30 tablet 0   methylphenidate 36 MG PO CR tablet Take 1 tablet (36 mg total) by mouth daily. 30 tablet 0   methylphenidate 36 MG PO CR tablet Take 1 tablet (36 mg total) by mouth daily. 30 tablet 0   naloxone (NARCAN) nasal spray 4 mg/0.1 mL Call 911. Administer a single spray in one nostril. If no to minimal response after 2-3 minutes, an additional dose may be given in the alternate nostril. (Patient taking differently: Place into the nose. Patient carries in case someone NEEDS it) 2 each PRN   No current facility-administered medications for this visit.    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   Complex tear of medial meniscus of right knee 01/01/2017   Depression    Hyperlipidemia    Lateral meniscus tear 12/2016   right medial   Past Surgical History:  Procedure Laterality Date   CATARACT EXTRACTION W/ INTRAOCULAR LENS IMPLANT Right 03/2015   CESAREAN SECTION  09/23/2000   COLONOSCOPY     KNEE ARTHROSCOPY Right 01/01/2017   Procedure: RIGHT KNEE ARTHROSCOPY, CHONDROPLASTY AND PARTIAL MEDIAL LATERAL MENISCECTOMY;  Surgeon: Osa Blase, MD;  Location: [facility];  Service: Orthopedics;  Laterality: Right;   SKIN CANCER EXCISION     TUBAL LIGATION  09/23/2000   Social History   Socioeconomic History   Marital status: Married    Spouse name: Not on file   Number of children: 2   Years of education: Not on file   Highest education level: Not on file  Occupational History   Not on file  Tobacco Use   Smoking status: Never   Smokeless tobacco: Never   Vaping Use   Vaping status: Never Used  Substance and Sexual Activity   Alcohol use: Yes    Comment: 1 beer on the weekend   Drug use: No   Sexual activity: Yes    Birth control/protection: Surgical, Post-menopausal    Comment: 1st intercourse 63 yo-More than 5 partners  Other Topics Concern   Not on file  Social History Narrative   Not on file   Social Drivers of Health   Financial Resource Strain: Not on file  Food Insecurity: Not on file  Transportation Needs: Not on file  Physical Activity: Not on file  Stress: Not on file  Social Connections: Not on file  Intimate Partner Violence: Not on file   Family History  Problem Relation Age of Onset   Arthritis Mother    Bipolar disorder Mother    Anxiety disorder Mother    Depression Mother    Heart failure Mother    Cancer Mother  Colon,Lung,Kidney   Colon cancer Mother 42   Heart disease Father    Hypertension Father    Cancer Father        Skin   Depression Sister    Colon polyps Brother    Heart disease Brother    Hypertension Brother    Cancer Brother        Skin   Alcohol abuse Brother    Bipolar disorder Maternal Grandmother    Alcohol abuse Maternal Grandmother    Depression Maternal Grandmother    Diabetes Maternal Grandfather    Esophageal cancer Neg Hx    Rectal cancer Neg Hx    Stomach cancer Neg Hx    Inflammatory bowel disease Neg Hx    Liver disease Neg Hx    Pancreatic cancer Neg Hx    I have reviewed her medical, social, and family history in detail and updated the electronic medical record as necessary.    PHYSICAL EXAMINATION  BP 118/70   Pulse (!) 57   Ht 5\' 1"  (1.549 m)   Wt 110 lb (49.9 kg)   LMP 10/16/2011   BMI 20.78 kg/m  Wt Readings from Last 3 Encounters:  04/22/23 110 lb (49.9 kg)  03/16/23 114 lb (51.7 kg)  03/10/22 119 lb (54 kg)  GEN: NAD, appears stated age, doesn't appear chronically ill PSYCH: Cooperative, without pressured speech EYE: Conjunctivae pink,  sclerae anicteric ENT: MMM CV: Nontachycardic RESP: No audible wheezing GI: NABS, soft, NT/ND, without rebound or guarding MSK/EXT: No significant lower extremity edema SKIN: No jaundice NEURO:  Alert & Oriented x 3, no focal deficits   REVIEW OF DATA  I reviewed the following data at the time of this encounter:  GI Procedures and Studies  2020 colonoscopy - Hemorrhoids found on digital rectal exam. - The examined portion of the ileum was normal - Stool in the entire examined colon. Copious lavage allowed adequate visualization. - One 10 mm polyp in the cecum, removed with a cold snare. Resected and retrieved. - Diverticulosis in the entire examined colon. - Non-bleeding non-thrombosed internal hemorrhoids. - The examination was otherwise normal.  Laboratory Studies  Reviewed those in epic  Imaging Studies  No relevant studies to review   ASSESSMENT  Ms. Horvath is a 63 y.o. female with a pmh significant for hyperlipidemia, arthritis, anxiety, MDD, allergies, colon polyps (TA's), family history colon cancer (mother).  The patient is seen today for evaluation and management of:  1. Hx of adenomatous colonic polyps   2. Constipation, unspecified constipation type    The patient is clinically and hemodynamically stable at this time.  She is able to manage her bowel habits and her mild constipation as discussed above.  She is due for colon polyp surveillance.  With her family history and history of previous advanced adenoma, she will always remain on at least a 5-year colonoscopy recall.  The risks and benefits of endoscopic evaluation were discussed with the patient; these include but are not limited to the risk of perforation, infection, bleeding, missed lesions, lack of diagnosis, severe illness requiring hospitalization, as well as anesthesia and sedation related illnesses.  The patient and/or family is agreeable to proceed.  All patient questions were answered to the best of my  ability, and the patient agrees to the aforementioned plan of action with follow-up as indicated.   PLAN  Proceed with scheduling colonoscopy - Senokot daily week before procedure - Sutab per patient preference for colonoscopy prep   Orders Placed This  Encounter  Procedures   Ambulatory referral to Gastroenterology    New Prescriptions   SODIUM SULFATE-MAG SULFATE-KCL (SUTAB) [phone] MG TABS    Take 24 tablets by mouth as directed.   Modified Medications   No medications on file    Planned Follow Up No follow-ups on file.   Total Time in Face-to-Face and in Coordination of Care for patient including independent/personal interpretation/review of prior testing, medical history, examination, medication adjustment, communicating results with the patient directly, and documentation within the EHR is 30 minutes.   Yong Henle, MD [facility] Advanced [facility]oscopy Office # [phone]

## 2023-04-22 NOTE — Patient Instructions (Signed)
 We have sent the following medications to your pharmacy for you to pick up at your convenience: Sutab   Please purchase the following medications over the counter and take as directed:  1 week before your colonoscopy start Senakot 1 pill by mouth every other day.   _______________________________________________________  If your blood pressure at your visit was 140/90 or greater, please contact your primary care physician to follow up on this.  _______________________________________________________  If you are age 57 or older, your body mass index should be between 23-30. Your Body mass index is 20.78 kg/m. If this is out of the aforementioned range listed, please consider follow up with your Primary Care Provider.  If you are age 8 or younger, your body mass index should be between 19-25. Your Body mass index is 20.78 kg/m. If this is out of the aformentioned range listed, please consider follow up with your Primary Care Provider.   ________________________________________________________  The [facility] providers would like to encourage you to use [facility] to communicate with providers for non-urgent requests or questions.  Due to long hold times on the telephone, sending your provider a message by [facility] may be a faster and more efficient way to get a response.  Please allow 48 business hours for a response.  Please remember that this is for non-urgent requests.  _______________________________________________________  Thank you for choosing me and [facility].  Dr. Meridee Score

## 2023-04-23 ENCOUNTER — Other Ambulatory Visit: Payer: Self-pay

## 2023-04-23 ENCOUNTER — Other Ambulatory Visit (HOSPITAL_COMMUNITY): Payer: Self-pay

## 2023-04-25 ENCOUNTER — Encounter: Payer: Self-pay | Admitting: Gastroenterology

## 2023-04-25 DIAGNOSIS — Z860101 Personal history of adenomatous and serrated colon polyps: Secondary | ICD-10-CM | POA: Insufficient documentation

## 2023-04-25 DIAGNOSIS — K5900 Constipation, unspecified: Secondary | ICD-10-CM | POA: Insufficient documentation

## 2023-04-30 DIAGNOSIS — Z1231 Encounter for screening mammogram for malignant neoplasm of breast: Secondary | ICD-10-CM | POA: Diagnosis not present

## 2023-05-04 ENCOUNTER — Encounter: Payer: Self-pay | Admitting: Obstetrics and Gynecology

## 2023-05-05 NOTE — Telephone Encounter (Signed)
Placed in [place] hold.  Encounter closed.

## 2023-05-07 DIAGNOSIS — R92331 Mammographic heterogeneous density, right breast: Secondary | ICD-10-CM | POA: Diagnosis not present

## 2023-05-07 DIAGNOSIS — N6489 Other specified disorders of breast: Secondary | ICD-10-CM | POA: Diagnosis not present

## 2023-05-07 DIAGNOSIS — R922 Inconclusive mammogram: Secondary | ICD-10-CM | POA: Diagnosis not present

## 2023-05-08 ENCOUNTER — Encounter: Payer: Self-pay | Admitting: Obstetrics and Gynecology

## 2023-05-11 ENCOUNTER — Telehealth: Payer: Self-pay | Admitting: *Deleted

## 2023-05-11 NOTE — Telephone Encounter (Signed)
 Call returned to patient. Patient reports flat, red, itchy bump on left labia. Present for 3-4 weeks. No pain, draining or change in size. Patient requesting Dr. Tia Flowers to take a look vs dermatology given location of bump.   OV scheduled for 4/29 at 1630 with Dr. Tia Flowers.   Patient is agreeable to date and time.   Routing to provider for final review. Patient is agreeable to disposition. Will close encounter.

## 2023-05-12 ENCOUNTER — Ambulatory Visit: Admitting: Obstetrics and Gynecology

## 2023-05-12 ENCOUNTER — Encounter: Payer: Self-pay | Admitting: Obstetrics and Gynecology

## 2023-05-12 ENCOUNTER — Other Ambulatory Visit (HOSPITAL_COMMUNITY)
Admission: RE | Admit: 2023-05-12 | Discharge: 2023-05-12 | Disposition: A | Discharge status: Home / Self Care | Source: Ambulatory Visit | Attending: Obstetrics and Gynecology | Admitting: Obstetrics and Gynecology

## 2023-05-12 VITALS — BP 120/78 | HR 77

## 2023-05-12 DIAGNOSIS — Z7689 Persons encountering health services in other specified circumstances: Secondary | ICD-10-CM

## 2023-05-12 DIAGNOSIS — N9089 Other specified noninflammatory disorders of vulva and perineum: Secondary | ICD-10-CM

## 2023-05-12 NOTE — Progress Notes (Signed)
   Acute Office Visit  Subjective:    Patient ID: SHARNITA HENDRICH, female    DOB: 1960/05/18, 63 y.o.   MRN: [id number]   HPI 63 y.o. presents today for bump on labia (Left labial bump for 3-4 weeks, no pain//jj) .has had molluscum before. Slight pruritus over the raised lesion  Patient's last menstrual period was 10/16/2011.    Review of Systems     Objective:    OBGyn Exam  BP 120/78   Pulse 77   LMP 10/16/2011   SpO2 99%  Wt Readings from Last 3 Encounters:  04/22/23 110 lb (49.9 kg)  03/16/23 114 lb (51.7 kg)  03/10/22 119 lb (54 kg)      Left labia with flat raised possible molluscum lesions.  Patient consented for punch biopsy Procedure: Punch biopsy Patient consented for the procedure with written consent.  Time out performed. The area was cleaned with betadine.  1 cc of lidocaine  with epi was injected subcutaneously.  A 3 mm punch biopsy was used and the biopsy was then lifted and cut with the scissors and sent to pathology.  Hemostasis was achieved with silver nitrate.  Patient tolerated the procedure well.  Post care instructions were discussed with patient.     Patient informed chaperone available to be present for breast and/or pelvic exam. Patient has requested no chaperone to be present. Patient has been advised what will be completed during breast and pelvic exam.   Assessment & Plan:  Left labial lesions Punch biopsy collected.  To notify patient of the results. Post care instructions discussed with patient. Neosporin daily for only one week. Estrogen twice daily on the site for added healing. Warm soapy compression twice daily.  Look with a mirror.  Return with any warmth to the area, continued pain, abnormal discharge, fevers.  Reinaldo Caras

## 2023-05-14 ENCOUNTER — Other Ambulatory Visit (HOSPITAL_COMMUNITY): Payer: Self-pay

## 2023-05-14 ENCOUNTER — Encounter: Payer: Self-pay | Admitting: Obstetrics and Gynecology

## 2023-05-14 DIAGNOSIS — F411 Generalized anxiety disorder: Secondary | ICD-10-CM | POA: Diagnosis not present

## 2023-05-14 LAB — SURGICAL PATHOLOGY

## 2023-05-14 MED ORDER — METHYLPHENIDATE HCL ER (OSM) 18 MG PO TBCR
18.0000 mg | EXTENDED_RELEASE_TABLET | Freq: Every day | ORAL | 0 refills | Status: DC
  Filled 2023-05-14: qty 30, 30d supply, fill #0

## 2023-05-15 ENCOUNTER — Encounter: Payer: Self-pay | Admitting: Gastroenterology

## 2023-05-15 ENCOUNTER — Encounter: Payer: Self-pay | Admitting: Pharmacist

## 2023-05-15 ENCOUNTER — Other Ambulatory Visit (HOSPITAL_COMMUNITY): Payer: Self-pay

## 2023-05-15 ENCOUNTER — Other Ambulatory Visit: Payer: Self-pay

## 2023-05-17 ENCOUNTER — Encounter: Payer: Self-pay | Admitting: Gastroenterology

## 2023-05-18 ENCOUNTER — Other Ambulatory Visit (HOSPITAL_COMMUNITY): Payer: Self-pay

## 2023-05-21 ENCOUNTER — Telehealth: Payer: Self-pay | Admitting: Pediatrics

## 2023-05-21 MED ORDER — ONDANSETRON 8 MG PO TBDP: 8.0000 mg | ORAL_TABLET | Freq: Three times a day (TID) | ORAL | 0 refills | Status: DC | PRN

## 2023-05-21 NOTE — Telephone Encounter (Signed)
 I spoke on the telephone with [name] on-call regarding nausea, vomiting and abdominal discomfort with Sutab  bowel prep.  States she took 12 tablets of Sutab  between 6 and 7 PM Subsequently developed the onset of waves of gas pain Experienced nausea and vomiting -vomited yellowish fluid but no Sutab  tablets Now beginning to have loose stool and diarrhea  Advised taking simethicone which she has available at home for gas pain States her nausea is abating but is interested in having nausea medication available in anticipation of second portion of Sutab  at 9 AM Prescription for ondansetron  8 mg ODT tablets p.o. every 8 hours as needed for nausea and vomiting sent to [facility] pharmacy at [address] in [place] notes pharmacy is open until 10 PM  All questions answered

## 2023-05-22 ENCOUNTER — Ambulatory Visit (AMBULATORY_SURGERY_CENTER): Admitting: Gastroenterology

## 2023-05-22 ENCOUNTER — Encounter: Payer: Self-pay | Admitting: Gastroenterology

## 2023-05-22 VITALS — BP 115/64 | HR 98 | Temp 97.9°F | Resp 17 | Ht 61.0 in | Wt 110.0 lb

## 2023-05-22 DIAGNOSIS — K6289 Other specified diseases of anus and rectum: Secondary | ICD-10-CM

## 2023-05-22 DIAGNOSIS — F32A Depression, unspecified: Secondary | ICD-10-CM | POA: Diagnosis not present

## 2023-05-22 DIAGNOSIS — E785 Hyperlipidemia, unspecified: Secondary | ICD-10-CM | POA: Diagnosis not present

## 2023-05-22 DIAGNOSIS — D123 Benign neoplasm of transverse colon: Secondary | ICD-10-CM | POA: Diagnosis not present

## 2023-05-22 DIAGNOSIS — K641 Second degree hemorrhoids: Secondary | ICD-10-CM | POA: Diagnosis not present

## 2023-05-22 DIAGNOSIS — Q439 Congenital malformation of intestine, unspecified: Secondary | ICD-10-CM | POA: Diagnosis not present

## 2023-05-22 DIAGNOSIS — Z860101 Personal history of adenomatous and serrated colon polyps: Secondary | ICD-10-CM | POA: Diagnosis not present

## 2023-05-22 DIAGNOSIS — K644 Residual hemorrhoidal skin tags: Secondary | ICD-10-CM | POA: Diagnosis not present

## 2023-05-22 DIAGNOSIS — Z1211 Encounter for screening for malignant neoplasm of colon: Secondary | ICD-10-CM | POA: Diagnosis not present

## 2023-05-22 DIAGNOSIS — F419 Anxiety disorder, unspecified: Secondary | ICD-10-CM | POA: Diagnosis not present

## 2023-05-22 DIAGNOSIS — Z800 Family history of malignant neoplasm of digestive organs: Secondary | ICD-10-CM | POA: Diagnosis not present

## 2023-05-22 MED ORDER — SODIUM CHLORIDE 0.9 % IV SOLN: 500.0000 mL | Freq: Once | INTRAVENOUS | Status: DC

## 2023-05-22 NOTE — Telephone Encounter (Signed)
 Called patient to follow up with her to see how she was doing this morning.  No answer. Left detailed message for the patient to call back and give us  an update on gas, nausea and vomiting.

## 2023-05-22 NOTE — Patient Instructions (Addendum)
-   High fiber diet. - Use FiberCon 1-2 tablets PO daily. - Continue present medications. - Await pathology results. - Repeat colonoscopy in 5 years for surveillance no matter pathology due to history of advanced adenoma.  YOU HAD AN ENDOSCOPIC PROCEDURE TODAY AT THE  ENDOSCOPY CENTER:   Refer to the procedure report that was given to you for any specific questions about what was found during the examination.  If the procedure report does not answer your questions, please call your gastroenterologist to clarify.  If you requested that your care partner not be given the details of your procedure findings, then the procedure report has been included in a sealed envelope for you to review at your convenience later.  YOU SHOULD EXPECT: Some feelings of bloating in the abdomen. Passage of more gas than usual.  Walking can help get rid of the air that was put into your GI tract during the procedure and reduce the bloating. If you had a lower endoscopy (such as a colonoscopy or flexible sigmoidoscopy) you may notice spotting of blood in your stool or on the toilet paper. If you underwent a bowel prep for your procedure, you may not have a normal bowel movement for a few days.  Please Note:  You might notice some irritation and congestion in your nose or some drainage.  This is from the oxygen used during your procedure.  There is no need for concern and it should clear up in a day or so.  SYMPTOMS TO REPORT IMMEDIATELY:  Following lower endoscopy (colonoscopy or flexible sigmoidoscopy):  Excessive amounts of blood in the stool  Significant tenderness or worsening of abdominal pains  Swelling of the abdomen that is new, acute  Fever of 100F or higher  For urgent or emergent issues, a gastroenterologist can be reached at any hour by calling [phone]. Do not use MyChart messaging for urgent concerns.    DIET:  We do recommend a small meal at first, but then you may proceed to your regular  diet.  Drink plenty of fluids but you should avoid alcoholic beverages for 24 hours.  ACTIVITY:  You should plan to take it easy for the rest of today and you should NOT DRIVE or use heavy machinery until tomorrow (because of the sedation medicines used during the test).    FOLLOW UP: Our staff will call the number listed on your records the next business day following your procedure.  We will call around 7:15- 8:00 am to check on you and address any questions or concerns that you may have regarding the information given to you following your procedure. If we do not reach you, we will leave a message.     If any biopsies were taken you will be contacted by phone or by letter within the next 1-3 weeks.  Please call us  at [phone] if you have not heard about the biopsies in 3 weeks.    SIGNATURES/CONFIDENTIALITY: You and/or your care partner have signed paperwork which will be entered into your electronic medical record.  These signatures attest to the fact that that the information above on your After Visit Summary has been reviewed and is understood.  Full responsibility of the confidentiality of this discharge information lies with you and/or your care-partner.

## 2023-05-22 NOTE — Progress Notes (Unsigned)
 Report given to PACU, vss

## 2023-05-22 NOTE — Telephone Encounter (Signed)
 Called patient this morning to check and see how she is and got no answer, left voicemail for her to call us  back.

## 2023-05-22 NOTE — Telephone Encounter (Signed)
 Called and spoke with patient and she states she is doing good and will be in for her procedure at 2pm.

## 2023-05-22 NOTE — Progress Notes (Unsigned)
 GASTROENTEROLOGY PROCEDURE H&P NOTE   Primary Care Physician: Darnelle Elders, PA-C  HPI: Ann Mendez is a 63 y.o. female who presents for surveillance Colonoscopy for FHx Colon Cancer and prior advanced adenoma.  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   Complex tear of medial meniscus of right knee 01/01/2017   Depression    Hyperlipidemia    Lateral meniscus tear 12/2016   right medial   Thyroid  disease    Past Surgical History:  Procedure Laterality Date   CATARACT EXTRACTION W/ INTRAOCULAR LENS IMPLANT Right 03/2015   CESAREAN SECTION  09/23/2000   COLONOSCOPY     KNEE ARTHROSCOPY Right 01/01/2017   Procedure: RIGHT KNEE ARTHROSCOPY, CHONDROPLASTY AND PARTIAL MEDIAL LATERAL MENISCECTOMY;  Surgeon: Osa Blase, MD;  Location: [facility];  Service: Orthopedics;  Laterality: Right;   SKIN CANCER EXCISION     TUBAL LIGATION  09/23/2000   Current Outpatient Medications  Medication Sig Dispense Refill   buPROPion  (WELLBUTRIN  XL) 150 MG 24 hr tablet Take 1 tablet (150 mg total) by mouth daily. 90 tablet 6   cholecalciferol (VITAMIN D ) 1000 UNITS tablet Take 2,000 Units by mouth daily.      estradiol  (ESTRACE  VAGINAL) 0.1 MG/GM vaginal cream Place 2 grams vaginally twice weekly as directed 42.5 g 0   levothyroxine  (SYNTHROID ) 25 MCG tablet Take 1 tablet (25 mcg total) by mouth in the morning on an empty stomach 90 tablet 3   methylphenidate  18 MG PO CR tablet Take 1 tablet (18 mg total) by mouth daily. 30 tablet 0   OVER THE COUNTER MEDICATION Hair/skin/nails ([employer] brand) 2,500 mcg biotin One daily     rosuvastatin  (CRESTOR ) 5 MG tablet Take 1 tablet (5 mg total) by mouth daily. 90 tablet 1   sertraline  (ZOLOFT ) 100 MG tablet Take 2 tablets (200 mg total) by mouth daily. 180  tablet 5   acyclovir  (ZOVIRAX ) 200 MG capsule Take 1 capsule (200 mg total) by mouth 2 (two) times daily. (Patient taking differently: Take 200 mg by mouth 2 (two) times daily. AS NEEDED) 60 capsule 4   diclofenac  (VOLTAREN ) 75 MG EC tablet Take 1 tablet by mouth daily (Patient taking differently: Take 75 mg by mouth. Every third day) 30 tablet 1   Lidocaine -Hydrocort , Perianal, 3-0.5 % CREA Apply thin film locally to affected area up to 3 times daily as needed, avoid intravaginal application 85 g 0   naloxone  (NARCAN ) nasal spray 4 mg/0.1 mL Call 911. Administer a single spray in one nostril. If no to minimal response after 2-3 minutes, an additional dose may be given in the alternate nostril. (Patient not taking: Reported on 05/12/2023) 2 each PRN   ondansetron  (ZOFRAN -ODT) 8 MG disintegrating tablet Take 1 tablet (8 mg total) by mouth every 8 (eight) hours as needed for up to 10 doses for nausea or vomiting. (Patient not taking: Reported on 05/22/2023) 10 tablet 0   Prasterone  (INTRAROSA ) 6.5 MG INST Place 1 suppository vaginally at bedtime as needed. 30 each 12   Current Facility-Administered Medications  Medication Dose Route Frequency Provider Last Rate Last Admin   0.9 %  sodium chloride  infusion  500 mL Intravenous Once Mansouraty, Johna Kearl Jr., MD        Current Outpatient Medications:  buPROPion  (WELLBUTRIN  XL) 150 MG 24 hr tablet, Take 1 tablet (150 mg total) by mouth daily., Disp: 90 tablet, Rfl: 6   cholecalciferol (VITAMIN D ) 1000 UNITS tablet, Take 2,000 Units by mouth daily. , Disp: , Rfl:    estradiol  (ESTRACE  VAGINAL) 0.1 MG/GM vaginal cream, Place 2 grams vaginally twice weekly as directed, Disp: 42.5 g, Rfl: 0   levothyroxine  (SYNTHROID ) 25 MCG tablet, Take 1 tablet (25 mcg total) by mouth in the morning on an empty stomach, Disp: 90 tablet, Rfl: 3   methylphenidate  18 MG PO CR tablet, Take 1 tablet (18 mg total) by mouth daily., Disp: 30 tablet, Rfl: 0   OVER THE COUNTER  MEDICATION, Hair/skin/nails ([employer] brand) 2,500 mcg biotin One daily, Disp: , Rfl:    rosuvastatin  (CRESTOR ) 5 MG tablet, Take 1 tablet (5 mg total) by mouth daily., Disp: 90 tablet, Rfl: 1   sertraline  (ZOLOFT ) 100 MG tablet, Take 2 tablets (200 mg total) by mouth daily., Disp: 180 tablet, Rfl: 5   acyclovir  (ZOVIRAX ) 200 MG capsule, Take 1 capsule (200 mg total) by mouth 2 (two) times daily. (Patient taking differently: Take 200 mg by mouth 2 (two) times daily. AS NEEDED), Disp: 60 capsule, Rfl: 4   diclofenac  (VOLTAREN ) 75 MG EC tablet, Take 1 tablet by mouth daily (Patient taking differently: Take 75 mg by mouth. Every third day), Disp: 30 tablet, Rfl: 1   Lidocaine -Hydrocort , Perianal, 3-0.5 % CREA, Apply thin film locally to affected area up to 3 times daily as needed, avoid intravaginal application, Disp: 85 g, Rfl: 0   naloxone  (NARCAN ) nasal spray 4 mg/0.1 mL, Call 911. Administer a single spray in one nostril. If no to minimal response after 2-3 minutes, an additional dose may be given in the alternate nostril. (Patient not taking: Reported on 05/12/2023), Disp: 2 each, Rfl: PRN   ondansetron  (ZOFRAN -ODT) 8 MG disintegrating tablet, Take 1 tablet (8 mg total) by mouth every 8 (eight) hours as needed for up to 10 doses for nausea or vomiting. (Patient not taking: Reported on 05/22/2023), Disp: 10 tablet, Rfl: 0   Prasterone  (INTRAROSA ) 6.5 MG INST, Place 1 suppository vaginally at bedtime as needed., Disp: 30 each, Rfl: 12  Current Facility-Administered Medications:    0.9 %  sodium chloride  infusion, 500 mL, Intravenous, Once, Mansouraty, Albino Alu., MD Allergies  Allergen Reactions   Stadol [Butorphanol] Other (See Comments)    Patient reports received in 1998, increased symptoms of HELLP   Sulfa Antibiotics Other (See Comments)    UNKNOWN   Family History  Problem Relation Age of Onset   Arthritis Mother    Bipolar disorder Mother    Anxiety disorder Mother    Depression  Mother    Heart failure Mother    Cancer Mother        Colon,Lung,Kidney   Colon cancer Mother 54   Heart disease Father    Hypertension Father    Cancer Father        Skin   Depression Sister    Colon polyps Brother    Heart disease Brother    Hypertension Brother    Cancer Brother        Skin   Alcohol abuse Brother    Bipolar disorder Maternal Grandmother    Alcohol abuse Maternal Grandmother    Depression Maternal Grandmother    Diabetes Maternal Grandfather    Esophageal cancer Neg Hx    Rectal cancer Neg Hx    Stomach cancer Neg  Hx    Inflammatory bowel disease Neg Hx    Liver disease Neg Hx    Pancreatic cancer Neg Hx    Social History   Socioeconomic History   Marital status: Married    Spouse name: Not on file   Number of children: 2   Years of education: Not on file   Highest education level: Not on file  Occupational History   Not on file  Tobacco Use   Smoking status: Never   Smokeless tobacco: Never  Vaping Use   Vaping status: Never Used  Substance and Sexual Activity   Alcohol use: Yes    Comment: 1 beer on the weekend   Drug use: No   Sexual activity: Yes    Birth control/protection: Surgical, Post-menopausal    Comment: 1st intercourse 63 yo-More than 5 partners, btl  Other Topics Concern   Not on file  Social History Narrative   Not on file   Social Drivers of Health   Financial Resource Strain: Not on file  Food Insecurity: Not on file  Transportation Needs: Not on file  Physical Activity: Not on file  Stress: Not on file  Social Connections: Not on file  Intimate Partner Violence: Not on file    Physical Exam: Today's Vitals   05/22/23 1411 05/22/23 1412  BP: 130/85   Pulse: 88   Temp: 97.9 F (36.6 C) 97.9 F (36.6 C)  TempSrc: Temporal   SpO2: 96%   Weight: 110 lb (49.9 kg)   Height: 5\' 1"  (1.549 m)    Body mass index is 20.78 kg/m. GEN: NAD EYE: Sclerae anicteric ENT: MMM CV: Non-tachycardic GI: Soft,  NT/ND NEURO:  Alert & Oriented x 3  Lab Results: No results for input(s): "WBC", "HGB", "HCT", "PLT" in the last 72 hours. BMET No results for input(s): "NA", "K", "CL", "CO2", "GLUCOSE", "BUN", "CREATININE", "CALCIUM " in the last 72 hours. LFT No results for input(s): "PROT", "ALBUMIN", "AST", "ALT", "ALKPHOS", "BILITOT", "BILIDIR", "IBILI" in the last 72 hours. PT/INR No results for input(s): "LABPROT", "INR" in the last 72 hours.   Impression / Plan: This is a 63 y.o.female who presents for surveillance Colonoscopy for FHx Colon Cancer and prior advanced adenoma.  The risks and benefits of endoscopic evaluation/treatment were discussed with the patient and/or family; these include but are not limited to the risk of perforation, infection, bleeding, missed lesions, lack of diagnosis, severe illness requiring hospitalization, as well as anesthesia and sedation related illnesses.  The patient's history has been reviewed, patient examined, no change in status, and deemed stable for procedure.  The patient and/or family is agreeable to proceed.    Yong Henle, MD [facility] Advanced [facility]oscopy Office # [phone]

## 2023-05-22 NOTE — Telephone Encounter (Signed)
 NM, Thanks for help with patient.  LEC charge, Please check in on patient and see how she is done overnight. Thanks. GM

## 2023-05-22 NOTE — Op Note (Signed)
 [facility] Patient Name: Ann Mendez Procedure Date: 05/22/2023 2:56 PM MRN: [id number] Endoscopist: Yong Henle , MD, [id number] Age: 63 Referring MD:  Date of Birth: 07/10/1960 Gender: Female Account #: [id number] Procedure:                Colonoscopy Indications:              Surveillance: Personal history of adenomatous                            polyps on last colonoscopy 3 years ago, High risk                            colon cancer surveillance: Personal history of                            adenoma (10 mm or greater in size) Medicines:                Monitored Anesthesia Care Procedure:                Pre-Anesthesia Assessment:                           - Prior to the procedure, a History and Physical                            was performed, and patient medications and                            allergies were reviewed. The patient's tolerance of                            previous anesthesia was also reviewed. The risks                            and benefits of the procedure and the sedation                            options and risks were discussed with the patient.                            All questions were answered, and informed consent                            was obtained. Prior Anticoagulants: The patient has                            taken no anticoagulant or antiplatelet agents. ASA                            Grade Assessment: II - A patient with mild systemic                            disease. After reviewing the risks and benefits,  the patient was deemed in satisfactory condition to                            undergo the procedure.                           After obtaining informed consent, the colonoscope                            was passed under direct vision. Throughout the                            procedure, the patient's blood pressure, pulse, and                            oxygen saturations were  monitored continuously. The                            Olympus Scope [id number] was introduced through the                            anus and advanced to the the cecum, identified by                            appendiceal orifice and ileocecal valve. The                            colonoscopy was performed without difficulty. The                            patient tolerated the procedure. The quality of the                            bowel preparation was good. The ileocecal valve,                            appendiceal orifice, and rectum were photographed. Scope In: 3:14:07 PM Scope Out: 3:25:58 PM Scope Withdrawal Time: 0 hours 7 minutes 35 seconds  Total Procedure Duration: 0 hours 11 minutes 51 seconds  Findings:                 Skin tags were found on perianal exam.                           The digital rectal exam findings include                            hemorrhoids. Pertinent negatives include no                            palpable rectal lesions.                           The colon (entire examined portion) was moderately  tortuous.                           A 6 mm polyp was found in the transverse colon. The                            polyp was sessile. The polyp was removed with a                            cold snare. Resection and retrieval were complete.                           Many medium-mouthed and small-mouthed diverticula                            were found in the entire colon.                           Normal mucosa was found in the entire colon                            otherwise.                           Anal papillae were hypertrophied.                           Non-bleeding non-thrombosed external and internal                            hemorrhoids were found during retroflexion, during                            perianal exam and during digital exam. The                            hemorrhoids were Grade II (internal hemorrhoids                             that prolapse but reduce spontaneously). Complications:            No immediate complications. Estimated Blood Loss:     Estimated blood loss was minimal. Impression:               - Perianal skin tags found on perianal exam.                            Hemorrhoids found on digital rectal exam.                           - Tortuous colon.                           - One 6 mm polyp in the transverse colon, removed  with a cold snare. Resected and retrieved.                           - Diverticulosis in the entire examined colon.                           - Normal mucosa in the entire examined colon                            otherwise.                           - Anal papillae were hypertrophied. Non-bleeding                            non-thrombosed external and internal hemorrhoids. Recommendation:           - The patient will be observed post-procedure,                            until all discharge criteria are met.                           - Discharge patient to home.                           - Patient has a contact number available for                            emergencies. The signs and symptoms of potential                            delayed complications were discussed with the                            patient. Return to normal activities tomorrow.                            Written discharge instructions were provided to the                            patient.                           - High fiber diet.                           - Use FiberCon 1-2 tablets PO daily.                           - Continue present medications.                           - Await pathology results.                           - Repeat colonoscopy in 5 years for  surveillance no                            matter pathology due to history of advanced adenoma.                           - The findings and recommendations were discussed                             with the patient.                           - The findings and recommendations were discussed                            with the patient's family. Yong Henle, MD 05/22/2023 3:30:58 PM

## 2023-05-22 NOTE — Progress Notes (Signed)
 Pt's states no medical or surgical changes since previsit or office visit.

## 2023-05-25 ENCOUNTER — Telehealth: Payer: Self-pay

## 2023-05-25 NOTE — Telephone Encounter (Signed)
  Follow up Call-     05/22/2023    2:12 PM  Call back number  Post procedure Call Back phone  # [phone]  Permission to leave phone message Yes     Patient questions:  Do you have a fever, pain , or abdominal swelling? No. Pain Score  0 *  Have you tolerated food without any problems? Yes.    Have you been able to return to your normal activities? Yes.    Do you have any questions about your discharge instructions: Diet   No. Medications  No. Follow up visit  No.  Do you have questions or concerns about your Care? No.  Actions: * If pain score is 4 or above: No action needed, pain <4.

## 2023-05-26 ENCOUNTER — Encounter: Admitting: Gastroenterology

## 2023-05-27 LAB — SURGICAL PATHOLOGY

## 2023-05-31 ENCOUNTER — Ambulatory Visit: Payer: Self-pay | Admitting: Gastroenterology

## 2023-06-01 ENCOUNTER — Other Ambulatory Visit (HOSPITAL_COMMUNITY): Payer: Self-pay

## 2023-06-01 MED ORDER — METHYLPHENIDATE HCL ER (OSM) 18 MG PO TBCR
18.0000 mg | EXTENDED_RELEASE_TABLET | Freq: Every day | ORAL | 0 refills | Status: DC
  Filled 2023-06-15: qty 30, 30d supply, fill #0

## 2023-06-02 ENCOUNTER — Other Ambulatory Visit (HOSPITAL_COMMUNITY): Payer: Self-pay

## 2023-06-02 ENCOUNTER — Other Ambulatory Visit: Payer: Self-pay | Admitting: Obstetrics and Gynecology

## 2023-06-02 ENCOUNTER — Other Ambulatory Visit: Payer: Self-pay

## 2023-06-02 DIAGNOSIS — N952 Postmenopausal atrophic vaginitis: Secondary | ICD-10-CM

## 2023-06-02 MED ORDER — BUPROPION HCL ER (XL) 150 MG PO TB24
150.0000 mg | ORAL_TABLET | Freq: Every day | ORAL | 6 refills | Status: AC
  Filled 2023-06-02: qty 90, 90d supply, fill #0
  Filled 2023-09-07 (×2): qty 90, 90d supply, fill #1

## 2023-06-02 MED ORDER — BUPROPION HCL ER (XL) 150 MG PO TB24
150.0000 mg | ORAL_TABLET | Freq: Every day | ORAL | 6 refills | Status: DC
  Filled 2023-06-02: qty 90, 90d supply, fill #0

## 2023-06-02 MED ORDER — ESTRADIOL 0.1 MG/GM VA CREA
2.0000 g | TOPICAL_CREAM | VAGINAL | 0 refills | Status: DC
  Filled 2023-06-02: qty 42.5, 74d supply, fill #0

## 2023-06-02 NOTE — Telephone Encounter (Signed)
 Med refill request: Estrace  Cream Last AEX: 03/16/3023-EB Next AEX: nothing, recall placed for 2027. Last MMG (if hormonal med): 04/2023-WNL Refill authorized:

## 2023-06-12 ENCOUNTER — Telehealth: Payer: Self-pay | Admitting: Physical Medicine and Rehabilitation

## 2023-06-12 NOTE — Telephone Encounter (Signed)
 Patient called and wants to go ahead and get schedule for the nerve conduction study. CB#[phone]

## 2023-06-15 ENCOUNTER — Other Ambulatory Visit (HOSPITAL_COMMUNITY): Payer: Self-pay

## 2023-06-20 ENCOUNTER — Other Ambulatory Visit (HOSPITAL_COMMUNITY): Payer: Self-pay

## 2023-06-22 ENCOUNTER — Other Ambulatory Visit (HOSPITAL_COMMUNITY): Payer: Self-pay

## 2023-06-22 ENCOUNTER — Other Ambulatory Visit: Payer: Self-pay

## 2023-06-30 ENCOUNTER — Other Ambulatory Visit (HOSPITAL_COMMUNITY): Payer: Self-pay

## 2023-06-30 DIAGNOSIS — F411 Generalized anxiety disorder: Secondary | ICD-10-CM | POA: Diagnosis not present

## 2023-06-30 MED ORDER — METHYLPHENIDATE HCL ER (OSM) 18 MG PO TBCR
18.0000 mg | EXTENDED_RELEASE_TABLET | Freq: Every day | ORAL | 0 refills | Status: DC
  Filled 2023-07-13: qty 30, 30d supply, fill #0

## 2023-07-03 DIAGNOSIS — M25561 Pain in right knee: Secondary | ICD-10-CM | POA: Diagnosis not present

## 2023-07-03 DIAGNOSIS — M25661 Stiffness of right knee, not elsewhere classified: Secondary | ICD-10-CM | POA: Diagnosis not present

## 2023-07-03 DIAGNOSIS — M9904 Segmental and somatic dysfunction of sacral region: Secondary | ICD-10-CM | POA: Diagnosis not present

## 2023-07-03 DIAGNOSIS — M9906 Segmental and somatic dysfunction of lower extremity: Secondary | ICD-10-CM | POA: Diagnosis not present

## 2023-07-03 DIAGNOSIS — M9905 Segmental and somatic dysfunction of pelvic region: Secondary | ICD-10-CM | POA: Diagnosis not present

## 2023-07-08 ENCOUNTER — Ambulatory Visit: Admitting: Physical Medicine and Rehabilitation

## 2023-07-08 ENCOUNTER — Other Ambulatory Visit (HOSPITAL_COMMUNITY): Payer: Self-pay

## 2023-07-08 DIAGNOSIS — M79641 Pain in right hand: Secondary | ICD-10-CM

## 2023-07-08 DIAGNOSIS — M542 Cervicalgia: Secondary | ICD-10-CM

## 2023-07-08 DIAGNOSIS — R202 Paresthesia of skin: Secondary | ICD-10-CM | POA: Diagnosis not present

## 2023-07-08 DIAGNOSIS — R531 Weakness: Secondary | ICD-10-CM | POA: Diagnosis not present

## 2023-07-08 DIAGNOSIS — M79642 Pain in left hand: Secondary | ICD-10-CM

## 2023-07-08 MED ORDER — GABAPENTIN 100 MG PO CAPS
100.0000 mg | ORAL_CAPSULE | Freq: Three times a day (TID) | ORAL | 0 refills | Status: DC | PRN
  Filled 2023-07-08: qty 180, 30d supply, fill #0

## 2023-07-08 NOTE — Progress Notes (Signed)
 Pain Scale   Average Pain 8 Patient advising she is Right hand dominate. Patient states her pain come at night more so on the left hand, patient advising she has numbness and tingling during the day and pain at night.        +Driver, -BT, -Dye Allergies.

## 2023-07-08 NOTE — Progress Notes (Signed)
 Ann Mendez - 63 y.o. female MRN [id number]  Date of birth: 1960/07/26  Office Visit Note: Visit Date: 07/08/2023 PCP: Katina Pfeiffer, PA-C Referred by: Marquette Ozell BIRCH, DO  Subjective: Chief Complaint  Patient presents with   Left Hand - Pain, Numbness, Weakness   Right Hand - Pain, Numbness, Weakness   HPI: Ann Mendez is a 63 y.o. female who comes in today at the request of Dr. Ozell Marquette for evaluation and management of chronic, worsening and severe pain, numbness and tingling in the Bilateral upper extremities.  Patient is Right hand dominant.  She reports a chronic issue with both hands now somewhat more left than right but fairly equal.  She has actually been seen in our office for her cervical spine in the past with cervical epidural that seemed to help her neck pain quite a bit.  She is followed by Dr. Marquette.  Cervical MRI as noted in the chart with multilevel foraminal narrowing but no high-grade central stenosis.  She does have degenerative changes quite significantly at C4-5.  No prior surgery.  She has had a history of hand pain with numbness and tingling somewhat globally but may be more radial digits.  This seemed to start out nocturnal symptoms with positive flick sign.  This is progressed now to really during the day and night.  Worse with activity.  She works as a Adult nurse.  Does use her hands some.  She has had multiple orthopedic complaints over the years.  No prior electrodiagnostic study.  Medications have really not helped.  She also does have thyroid  disease.   I spent more than 30 minutes speaking face-to-face with the patient with 50% of the time in counseling and discussing coordination of care.         Review of Systems  Musculoskeletal:  Positive for joint pain and neck pain.  Neurological:  Positive for tingling and weakness.  All other systems reviewed and are negative.  Otherwise per HPI.  Assessment & Plan: Visit Diagnoses:     ICD-10-CM   1. Paresthesia of skin  R20.2 NCV with EMG (electromyography)    2. Cervicalgia  M54.2     3. Bilateral hand pain  M79.641    M79.642     4. Weakness  R53.1        Plan: Impression: Clinically does seem to have some level of carpal tunnel syndrome but likely has overlying musculotendinous disorder and potentially arthritis in her hands with a history of multiple joint pains in the past.  The above electrodiagnostic study is ABNORMAL and reveals evidence of: A severe left median nerve entrapment at the wrist (carpal tunnel syndrome) affecting sensory and motor components.  A moderate right median nerve entrapment at the wrist (carpal tunnel syndrome) affecting sensory and motor components.   There is no significant electrodiagnostic evidence of any other focal nerve entrapment, brachial plexopathy or cervical radiculopathy.  As you know, this particular electrodiagnostic study cannot rule out chemical radiculitis or sensory only radiculopathy.   **This electrodiagnostic study cannot rule out small fiber polyneuropathy and dysesthesias from central pain syndromes such as stroke or central pain sensitization syndromes such as fibromyalgia.  Myotomal referral pain from trigger points is also not excluded.  Recommendations: 1.  Follow-up with referring physician. 2.  Continue current management of symptoms. 3.  Suggest surgical evaluation.  Suggest referral to Gildardo Alderton, MD in our office   Meds & Orders: No orders of the defined types were  placed in this encounter.   Orders Placed This Encounter  Procedures   NCV with EMG (electromyography)    Follow-up: Return for Ozell Jewell, DO.   Procedures: No procedures performed  EMG & NCV Findings: Evaluation of the left median motor and the right median motor nerves showed prolonged distal onset latency (L6.9, R5.2 ms) and decreased conduction velocity (Elbow-Wrist, L48, R49 m/s).  The left median (across palm) sensory  nerve showed no response (Wrist) and no response (Palm).  The right median (across palm) sensory nerve showed prolonged distal peak latency (Wrist, 5.2 ms) and prolonged distal peak latency (Palm, 2.3 ms).  All remaining nerves (as indicated in the following tables) were within normal limits.  Left vs. Right side comparison data for the median motor nerve indicates abnormal L-R latency difference (1.7 ms).  The ulnar motor nerve indicates abnormal L-R velocity difference (A Elbow-B Elbow, 19 m/s).  All remaining left vs. right side differences were within normal limits.    All examined muscles (as indicated in the following table) showed no evidence of electrical instability.    Impression: The above electrodiagnostic study is ABNORMAL and reveals evidence of: A severe left median nerve entrapment at the wrist (carpal tunnel syndrome) affecting sensory and motor components.  A moderate right median nerve entrapment at the wrist (carpal tunnel syndrome) affecting sensory and motor components.   There is no significant electrodiagnostic evidence of any other focal nerve entrapment, brachial plexopathy or cervical radiculopathy.  As you know, this particular electrodiagnostic study cannot rule out chemical radiculitis or sensory only radiculopathy.   **This electrodiagnostic study cannot rule out small fiber polyneuropathy and dysesthesias from central pain syndromes such as stroke or central pain sensitization syndromes such as fibromyalgia.  Myotomal referral pain from trigger points is also not excluded.  Recommendations: 1.  Follow-up with referring physician. 2.  Continue current management of symptoms. 3.  Suggest surgical evaluation.  ___________________________ Prentice Masters FAAPMR Board Certified, American Board of Physical Medicine and Rehabilitation    Nerve Conduction Studies Anti Sensory Summary Table   Stim Site NR Peak (ms) Norm Peak (ms) P-T Amp (V) Norm P-T Amp Site1 Site2  Delta-P (ms) Dist (cm) Vel (m/s) Norm Vel (m/s)  Left Median Acr Palm Anti Sensory (2nd Digit)  32C  Wrist *NR  <3.6  >10 Wrist Palm  0.0    Palm *NR  <2.0          Right Median Acr Palm Anti Sensory (2nd Digit)  31.6C  Wrist    *5.2 <3.6 21.5 >10 Wrist Palm 2.9 0.0    Palm    *2.3 <2.0 28.2         Left Radial Anti Sensory (Base 1st Digit)  31C  Wrist    2.1 <3.1 17.9  Wrist Base 1st Digit 2.1 0.0    Right Radial Anti Sensory (Base 1st Digit)  31.4C  Wrist    2.2 <3.1 33.8  Wrist Base 1st Digit 2.2 0.0    Left Ulnar Anti Sensory (5th Digit)  32C  Wrist    3.4 <3.7 17.5 >15.0 Wrist 5th Digit 3.4 14.0 41 >38  Right Ulnar Anti Sensory (5th Digit)  31.8C  Wrist    3.5 <3.7 33.8 >15.0 Wrist 5th Digit 3.5 14.0 40 >38   Motor Summary Table   Stim Site NR Onset (ms) Norm Onset (ms) O-P Amp (mV) Norm O-P Amp Site1 Site2 Delta-0 (ms) Dist (cm) Vel (m/s) Norm Vel (m/s)  Left Median Motor (Abd  Poll Brev)  31.5C  Wrist    *6.9 <4.2 5.7 >5 Elbow Wrist 4.0 19.0 *48 >50  Elbow    10.9  5.5         Right Median Motor (Abd Poll Brev)  31.8C  Wrist    *5.2 <4.2 7.7 >5 Elbow Wrist 3.9 19.0 *49 >50  Elbow    9.1  7.5         Left Ulnar Motor (Abd Dig Min)  31.6C  Wrist    2.7 <4.2 8.0 >3 B Elbow Wrist 2.8 17.0 61 >53  B Elbow    5.5  7.4  A Elbow B Elbow 1.4 9.0 64 >53  A Elbow    6.9  7.4         Right Ulnar Motor (Abd Dig Min)  31.8C  Wrist    2.7 <4.2 8.0 >3 B Elbow Wrist 2.9 17.0 59 >53  B Elbow    5.6  8.2  A Elbow B Elbow 1.2 10.0 83 >53  A Elbow    6.8  8.3          EMG   Side Muscle Nerve Root Ins Act Fibs Psw Amp Dur Poly Recrt Int Bruna Comment  Left Abd Poll Brev Median C8-T1 Nml Nml Nml Nml Nml 0 Nml Nml   Left 1stDorInt Ulnar C8-T1 Nml Nml Nml Nml Nml 0 Nml Nml   Left PronatorTeres Median C6-7 Nml Nml Nml Nml Nml 0 Nml Nml   Left Biceps Musculocut C5-6 Nml Nml Nml Nml Nml 0 Nml Nml   Left Deltoid Axillary C5-6 Nml Nml Nml Nml Nml 0 Nml Nml     Nerve Conduction  Studies Anti Sensory Left/Right Comparison   Stim Site L Lat (ms) R Lat (ms) L-R Lat (ms) L Amp (V) R Amp (V) L-R Amp (%) Site1 Site2 L Vel (m/s) R Vel (m/s) L-R Vel (m/s)  Median Acr Palm Anti Sensory (2nd Digit)  32C  Wrist  *5.2   21.5  Wrist Palm     Palm  *2.3   28.2        Radial Anti Sensory (Base 1st Digit)  31C  Wrist 2.1 2.2 0.1 17.9 33.8 47.0 Wrist Base 1st Digit     Ulnar Anti Sensory (5th Digit)  32C  Wrist 3.4 3.5 0.1 17.5 33.8 48.2 Wrist 5th Digit 41 40 1   Motor Left/Right Comparison   Stim Site L Lat (ms) R Lat (ms) L-R Lat (ms) L Amp (mV) R Amp (mV) L-R Amp (%) Site1 Site2 L Vel (m/s) R Vel (m/s) L-R Vel (m/s)  Median Motor (Abd Poll Brev)  31.5C  Wrist *6.9 *5.2 *1.7 5.7 7.7 26.0 Elbow Wrist *48 *49 1  Elbow 10.9 9.1 1.8 5.5 7.5 26.7       Ulnar Motor (Abd Dig Min)  31.6C  Wrist 2.7 2.7 0.0 8.0 8.0 0.0 B Elbow Wrist 61 59 2  B Elbow 5.5 5.6 0.1 7.4 8.2 9.8 A Elbow B Elbow 64 83 *19  A Elbow 6.9 6.8 0.1 7.4 8.3 10.8          Waveforms:                    Clinical History: CLINICAL DATA:  Inflammatory spondyloarthropathy.  Right neck pain.   EXAM: MRI CERVICAL SPINE WITHOUT CONTRAST   TECHNIQUE: Multiplanar, multisequence MR imaging of the cervical spine was performed. No intravenous contrast was administered.   COMPARISON:  08/28/2022 radiographs  FINDINGS: Alignment: Reversal the normal cervical lordosis with epicenter at C4-5. 2 mm degenerative retrolisthesis at C6-7. 1.5 mm degenerative anterolisthesis at C2-3 and C3-4.   Vertebrae: Disc desiccation loss of disc height throughout the cervical spine but especially at C4-5, C5-6, and C6-7. Degenerative endplate findings especially at C4-5, C5-6, and C6-7. Small Schmorl's node along the inferior endplate of C4. Schmorl's nodes along the anterior superior endplates at C7 and T1. 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   Cord: No significant abnormal  spinal cord signal is observed.   Posterior Fossa, vertebral arteries, paraspinal tissues: Unremarkable   Disc levels:   C2-3: No impingement.  Bilateral degenerative facet arthropathy   C3-4: Moderate right foraminal stenosis due to uncinate and facet spurring along with disc bulge. Central disc protrusion extends cephalad.   C4-5: Moderate to prominent left and moderate right foraminal stenosis due to disc osteophyte complex and facet arthropathy.   C5-6: Prominent left and moderate right foraminal stenosis and mild central narrowing of the thecal sac due to disc bulge, uncinate spurring, and facet arthropathy.   C6-7: Moderate left and mild right foraminal stenosis and mild central narrowing of the thecal sac due to disc osteophyte complex and facet arthropathy.   C7-T1: Borderline left foraminal stenosis due to facet spurring.   IMPRESSION: 1. Cervical spondylosis and degenerative disc disease causing prominent impingement at C5-6; moderate to prominent impingement at C4-5; moderate impingement at C3-4 and C6-7, as detailed above. 2. Reversal the normal cervical lordosis with epicenter at C4-5. 3. 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     Electronically Signed   By: Ryan Salvage M.D.   On: 10/20/2022 13:28   She reports that she has never smoked. She has never used smokeless tobacco. No results for input(s): HGBA1C, LABURIC in the last 8760 hours.  Objective:  VS:  HT:    WT:   BMI:     BP:   HR: bpm  TEMP: ( )  RESP:  Physical Exam Vitals and nursing note reviewed.  Constitutional:      General: She is not in acute distress.    Appearance: Normal appearance. She is well-developed. She is not ill-appearing.  HENT:     Head: Normocephalic and atraumatic.   Eyes:     Conjunctiva/sclera: Conjunctivae normal.     Pupils: Pupils are equal, round, and reactive to light.    Cardiovascular:     Rate and Rhythm:  Normal rate.     Pulses: Normal pulses.  Pulmonary:     Effort: Pulmonary effort is normal.   Musculoskeletal:        General: Tenderness present. No swelling or deformity.     Right lower leg: No edema.     Left lower leg: No edema.     Comments: Inspection reveals no atrophy of the bilateral APB or FDI or hand intrinsics. There is no swelling, color changes, allodynia or dystrophic changes. There is 5 out of 5 strength in the bilateral wrist extension, finger abduction and long finger flexion. There is there is decreased sensation to light touch left more than right median nerve distribution. There is a negative Tinel's test at the bilateral wrist and elbow. There is a positive Phalen's test bilaterally. There is a negative Hoffmann's test bilaterally.   Skin:    General: Skin is warm and dry.     Findings: No erythema or rash.   Neurological:     General: No focal  deficit present.     Mental Status: She is alert and oriented to person, place, and time.     Cranial Nerves: No cranial nerve deficit.     Sensory: No sensory deficit.     Motor: No weakness or abnormal muscle tone.     Coordination: Coordination normal.     Gait: Gait normal.   Psychiatric:        Mood and Affect: Mood normal.        Behavior: Behavior normal.     Ortho Exam  Imaging: No results found.  Past Medical/Family/Surgical/Social History: Medications & Allergies reviewed per EMR, new medications updated. Patient Active Problem List   Diagnosis Date Noted   Constipation 04/25/2023   History of adenomatous and serrated colon polyps 04/25/2023   Acute pain of right shoulder 08/30/2020   Hallux rigidus, left foot 03/01/2019   Soft tissue mass 03/01/2019   Bunion 03/01/2019   Complex tear of medial meniscus of right knee 01/01/2017   Right knee pain 07/10/2016   Depression 11/11/2011   DJD (degenerative joint disease) 10/09/2011   Other and unspecified hyperlipidemia 10/09/2011   Menopause  10/09/2011   Spinal stenosis 10/09/2011   History of Bell's palsy 10/09/2011   Herpes progenitalis    Osteoarthritis    ABDOMINAL PAIN, LEFT LOWER QUADRANT 07/27/2007   BIPOLAR DISORDER UNSPECIFIED 07/26/2007   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   Complex tear of medial meniscus of right knee 01/01/2017   Depression    Hyperlipidemia    Lateral meniscus tear 12/2016   right medial   Thyroid  disease    Family History  Problem Relation Age of Onset   Arthritis Mother    Bipolar disorder Mother    Anxiety disorder Mother    Depression Mother    Heart failure Mother    Cancer Mother        Colon,Lung,Kidney   Colon cancer Mother 68   Heart disease Father    Hypertension Father    Cancer Father        Skin   Depression Sister    Colon polyps Brother    Heart disease Brother    Hypertension Brother    Cancer Brother        Skin   Alcohol abuse Brother    Bipolar disorder Maternal Grandmother    Alcohol abuse Maternal Grandmother    Depression Maternal Grandmother    Diabetes Maternal Grandfather    Esophageal cancer Neg Hx    Rectal cancer Neg Hx    Stomach cancer Neg Hx    Inflammatory bowel disease Neg Hx    Liver disease Neg Hx    Pancreatic cancer Neg Hx    Past Surgical History:  Procedure Laterality Date   CATARACT EXTRACTION W/ INTRAOCULAR LENS IMPLANT Right 03/2015   CESAREAN SECTION  09/23/2000   COLONOSCOPY     KNEE ARTHROSCOPY Right 01/01/2017   Procedure: RIGHT KNEE ARTHROSCOPY, CHONDROPLASTY AND PARTIAL MEDIAL LATERAL MENISCECTOMY;  Surgeon: Josefina Chew, MD;  Location: [facility];  Service: Orthopedics;  Laterality: Right;   SKIN CANCER EXCISION     TUBAL LIGATION  09/23/2000   Social History   Occupational History   Not on file  Tobacco Use   Smoking status:  Never   Smokeless tobacco: Never  Vaping Use   Vaping status: Never Used  Substance and Sexual Activity   Alcohol use: Yes    Comment: 1 beer on the weekend   Drug use: No   Sexual activity: Yes    Birth control/protection: Surgical, Post-menopausal    Comment: 1st intercourse 63 yo-More than 5 partners, btl

## 2023-07-10 NOTE — Procedures (Unsigned)
 EMG & NCV Findings: Evaluation of the left median motor and the right median motor nerves showed prolonged distal onset latency (L6.9, R5.2 ms) and decreased conduction velocity (Elbow-Wrist, L48, R49 m/s).  The left median (across palm) sensory nerve showed no response (Wrist) and no response (Palm).  The right median (across palm) sensory nerve showed prolonged distal peak latency (Wrist, 5.2 ms) and prolonged distal peak latency (Palm, 2.3 ms).  All remaining nerves (as indicated in the following tables) were within normal limits.  Left vs. Right side comparison data for the median motor nerve indicates abnormal L-R latency difference (1.7 ms).  The ulnar motor nerve indicates abnormal L-R velocity difference (A Elbow-B Elbow, 19 m/s).  All remaining left vs. right side differences were within normal limits.    All examined muscles (as indicated in the following table) showed no evidence of electrical instability.    Impression: The above electrodiagnostic study is ABNORMAL and reveals evidence of: A severe left median nerve entrapment at the wrist (carpal tunnel syndrome) affecting sensory and motor components.  A moderate right median nerve entrapment at the wrist (carpal tunnel syndrome) affecting sensory and motor components.   There is no significant electrodiagnostic evidence of any other focal nerve entrapment, brachial plexopathy or cervical radiculopathy.  As you know, this particular electrodiagnostic study cannot rule out chemical radiculitis or sensory only radiculopathy.   **This electrodiagnostic study cannot rule out small fiber polyneuropathy and dysesthesias from central pain syndromes such as stroke or central pain sensitization syndromes such as fibromyalgia.  Myotomal referral pain from trigger points is also not excluded.  Recommendations: 1.  Follow-up with referring physician. 2.  Continue current management of symptoms. 3.  Suggest surgical  evaluation.  ___________________________ Ann Mendez FAAPMR Board Certified, American Board of Physical Medicine and Rehabilitation    Nerve Conduction Studies Anti Sensory Summary Table   Stim Site NR Peak (ms) Norm Peak (ms) P-T Amp (V) Norm P-T Amp Site1 Site2 Delta-P (ms) Dist (cm) Vel (m/s) Norm Vel (m/s)  Left Median Acr Palm Anti Sensory (2nd Digit)  32C  Wrist *NR  <3.6  >10 Wrist Palm  0.0    Palm *NR  <2.0          Right Median Acr Palm Anti Sensory (2nd Digit)  31.6C  Wrist    *5.2 <3.6 21.5 >10 Wrist Palm 2.9 0.0    Palm    *2.3 <2.0 28.2         Left Radial Anti Sensory (Base 1st Digit)  31C  Wrist    2.1 <3.1 17.9  Wrist Base 1st Digit 2.1 0.0    Right Radial Anti Sensory (Base 1st Digit)  31.4C  Wrist    2.2 <3.1 33.8  Wrist Base 1st Digit 2.2 0.0    Left Ulnar Anti Sensory (5th Digit)  32C  Wrist    3.4 <3.7 17.5 >15.0 Wrist 5th Digit 3.4 14.0 41 >38  Right Ulnar Anti Sensory (5th Digit)  31.8C  Wrist    3.5 <3.7 33.8 >15.0 Wrist 5th Digit 3.5 14.0 40 >38   Motor Summary Table   Stim Site NR Onset (ms) Norm Onset (ms) O-P Amp (mV) Norm O-P Amp Site1 Site2 Delta-0 (ms) Dist (cm) Vel (m/s) Norm Vel (m/s)  Left Median Motor (Abd Poll Brev)  31.5C  Wrist    *6.9 <4.2 5.7 >5 Elbow Wrist 4.0 19.0 *48 >50  Elbow    10.9  5.5  Right Median Motor (Abd Poll Brev)  31.8C  Wrist    *5.2 <4.2 7.7 >5 Elbow Wrist 3.9 19.0 *49 >50  Elbow    9.1  7.5         Left Ulnar Motor (Abd Dig Min)  31.6C  Wrist    2.7 <4.2 8.0 >3 B Elbow Wrist 2.8 17.0 61 >53  B Elbow    5.5  7.4  A Elbow B Elbow 1.4 9.0 64 >53  A Elbow    6.9  7.4         Right Ulnar Motor (Abd Dig Min)  31.8C  Wrist    2.7 <4.2 8.0 >3 B Elbow Wrist 2.9 17.0 59 >53  B Elbow    5.6  8.2  A Elbow B Elbow 1.2 10.0 83 >53  A Elbow    6.8  8.3          EMG   Side Muscle Nerve Root Ins Act Fibs Psw Amp Dur Poly Recrt Int Bruna Comment  Left Abd Poll Brev Median C8-T1 Nml Nml Nml Nml Nml 0 Nml Nml    Left 1stDorInt Ulnar C8-T1 Nml Nml Nml Nml Nml 0 Nml Nml   Left PronatorTeres Median C6-7 Nml Nml Nml Nml Nml 0 Nml Nml   Left Biceps Musculocut C5-6 Nml Nml Nml Nml Nml 0 Nml Nml   Left Deltoid Axillary C5-6 Nml Nml Nml Nml Nml 0 Nml Nml     Nerve Conduction Studies Anti Sensory Left/Right Comparison   Stim Site L Lat (ms) R Lat (ms) L-R Lat (ms) L Amp (V) R Amp (V) L-R Amp (%) Site1 Site2 L Vel (m/s) R Vel (m/s) L-R Vel (m/s)  Median Acr Palm Anti Sensory (2nd Digit)  32C  Wrist  *5.2   21.5  Wrist Palm     Palm  *2.3   28.2        Radial Anti Sensory (Base 1st Digit)  31C  Wrist 2.1 2.2 0.1 17.9 33.8 47.0 Wrist Base 1st Digit     Ulnar Anti Sensory (5th Digit)  32C  Wrist 3.4 3.5 0.1 17.5 33.8 48.2 Wrist 5th Digit 41 40 1   Motor Left/Right Comparison   Stim Site L Lat (ms) R Lat (ms) L-R Lat (ms) L Amp (mV) R Amp (mV) L-R Amp (%) Site1 Site2 L Vel (m/s) R Vel (m/s) L-R Vel (m/s)  Median Motor (Abd Poll Brev)  31.5C  Wrist *6.9 *5.2 *1.7 5.7 7.7 26.0 Elbow Wrist *48 *49 1  Elbow 10.9 9.1 1.8 5.5 7.5 26.7       Ulnar Motor (Abd Dig Min)  31.6C  Wrist 2.7 2.7 0.0 8.0 8.0 0.0 B Elbow Wrist 61 59 2  B Elbow 5.5 5.6 0.1 7.4 8.2 9.8 A Elbow B Elbow 64 83 *19  A Elbow 6.9 6.8 0.1 7.4 8.3 10.8          Waveforms:

## 2023-07-13 ENCOUNTER — Encounter: Payer: Self-pay | Admitting: Physical Medicine and Rehabilitation

## 2023-07-13 ENCOUNTER — Other Ambulatory Visit: Payer: Self-pay

## 2023-07-13 ENCOUNTER — Other Ambulatory Visit (HOSPITAL_COMMUNITY): Payer: Self-pay

## 2023-07-20 DIAGNOSIS — M542 Cervicalgia: Secondary | ICD-10-CM | POA: Diagnosis not present

## 2023-07-20 DIAGNOSIS — M546 Pain in thoracic spine: Secondary | ICD-10-CM | POA: Diagnosis not present

## 2023-07-20 DIAGNOSIS — M545 Low back pain, unspecified: Secondary | ICD-10-CM | POA: Diagnosis not present

## 2023-07-20 DIAGNOSIS — R102 Pelvic and perineal pain: Secondary | ICD-10-CM | POA: Diagnosis not present

## 2023-07-23 DIAGNOSIS — R102 Pelvic and perineal pain: Secondary | ICD-10-CM | POA: Diagnosis not present

## 2023-07-23 DIAGNOSIS — M546 Pain in thoracic spine: Secondary | ICD-10-CM | POA: Diagnosis not present

## 2023-07-23 DIAGNOSIS — M542 Cervicalgia: Secondary | ICD-10-CM | POA: Diagnosis not present

## 2023-07-23 DIAGNOSIS — M545 Low back pain, unspecified: Secondary | ICD-10-CM | POA: Diagnosis not present

## 2023-07-24 ENCOUNTER — Other Ambulatory Visit (HOSPITAL_COMMUNITY): Payer: Self-pay

## 2023-07-24 ENCOUNTER — Other Ambulatory Visit: Payer: Self-pay

## 2023-07-24 ENCOUNTER — Encounter: Payer: Self-pay | Admitting: Radiology

## 2023-07-24 ENCOUNTER — Ambulatory Visit: Admitting: Radiology

## 2023-07-24 VITALS — BP 130/66 | HR 73 | Wt 113.0 lb

## 2023-07-24 DIAGNOSIS — N952 Postmenopausal atrophic vaginitis: Secondary | ICD-10-CM

## 2023-07-24 DIAGNOSIS — A630 Anogenital (venereal) warts: Secondary | ICD-10-CM

## 2023-07-24 MED ORDER — IMIQUIMOD 5 % EX CREA
TOPICAL_CREAM | CUTANEOUS | 0 refills | Status: DC
  Filled 2023-07-24: qty 12, 30d supply, fill #0
  Filled 2023-07-24: qty 12, 28d supply, fill #0

## 2023-07-24 MED ORDER — ESTRADIOL 0.1 MG/GM VA CREA
1.0000 g | TOPICAL_CREAM | VAGINAL | 3 refills | Status: AC
  Filled 2023-07-24 – 2023-08-05 (×2): qty 42.5, 90d supply, fill #0
  Filled 2023-10-09 – 2023-10-12 (×2): qty 42.5, 90d supply, fill #1

## 2023-07-24 NOTE — Progress Notes (Signed)
      Subjective: Ann Mendez is a 63 y.o. female who complains of new bump on her perineum x 1 week. Was diagnosed with condyloma by biopsy of another area on her vulva 05/12/23.    Review of Systems  All other systems reviewed and are negative.   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   Physical Exam Vitals and nursing note reviewed. Exam conducted with a chaperone present.  Constitutional:      Appearance: Normal appearance. She is well-developed.  Pulmonary:     Effort: Pulmonary effort is normal.  Abdominal:     General: Abdomen is flat.     Palpations: Abdomen is soft.  Genitourinary:    General: Normal vulva.     Vagina: No vaginal discharge, erythema, bleeding or lesions.     Cervix: Normal. No discharge, friability, lesion or erythema.     Uterus: Normal.      Adnexa: Right adnexa normal and left adnexa normal.      Comments: 2mm condyloma present on the perineum Neurological:     Mental Status: She is alert.  Psychiatric:        Mood and Affect: Mood normal.        Thought Content: Thought content normal.        Judgment: Judgment normal.     Darice Hoit, CMA present for exam  Assessment:/Plan:   1. Condyloma (Primary) Offered cryo vs aldara , would like to try aldara  first - imiquimod  (ALDARA ) 5 % cream; Apply topically 3 (three) times a week.  Dispense: 12 each; Refill: 0  2. Postmenopausal atrophic vaginitis Refill sent - estradiol  (ESTRACE  VAGINAL) 0.1 MG/GM vaginal cream;  Place 1 g vaginally 3 (three) times a week.  Dispense: 42.5 g; Refill: 3   Follow up prn  Yurem Viner B, NP 8:59 AM

## 2023-08-03 ENCOUNTER — Ambulatory Visit: Admitting: Orthopedic Surgery

## 2023-08-03 DIAGNOSIS — G5603 Carpal tunnel syndrome, bilateral upper limbs: Secondary | ICD-10-CM

## 2023-08-03 DIAGNOSIS — M546 Pain in thoracic spine: Secondary | ICD-10-CM | POA: Diagnosis not present

## 2023-08-03 DIAGNOSIS — M545 Low back pain, unspecified: Secondary | ICD-10-CM | POA: Diagnosis not present

## 2023-08-03 DIAGNOSIS — M542 Cervicalgia: Secondary | ICD-10-CM | POA: Diagnosis not present

## 2023-08-03 DIAGNOSIS — R102 Pelvic and perineal pain: Secondary | ICD-10-CM | POA: Diagnosis not present

## 2023-08-03 NOTE — Progress Notes (Signed)
 Referring Physician:  Katina Pfeiffer, PA-C [address] [place],  [place] [address]  Primary Physician:  Katina Pfeiffer, PA-C  History of Present Illness: 08/05/2023 Ann Mendez is here today with a chief complaint of bilateral carpal tunnel syndrome.  She has numbness and tingling in her bilateral hands.  She tried nighttime splinting.  She has had progressive symptoms.  This wakes her from night time on a daily basis.  It is quite bothersome to her.  It is interfering with her quality of life.  She has not had a recent EMG.  She like to have a decompression, her preferences would be to have a minimally invasive approach without general anesthesia.  She is concerned that she would respond poorly to general anesthesia.  Conservative measures:  Physical therapy: Yes Occupational Therapy: No Hand Therapy: No Injections: Yes Gabapentin : Yes, Lyrica: No, Cymbalta: No Past Surgery: No  Review of Systems:  A 10 point review of systems is negative, except for the pertinent positives and negatives detailed in the HPI.  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   Complex tear of medial meniscus of right knee 01/01/2017   Depression    Hyperlipidemia    Lateral meniscus tear 12/2016   right medial   Thyroid  disease     Past Surgical History: Past Surgical History:  Procedure Laterality Date   CATARACT EXTRACTION W/ INTRAOCULAR LENS IMPLANT Right 03/2015   CESAREAN SECTION  09/23/2000   COLONOSCOPY     KNEE ARTHROSCOPY Right 01/01/2017   Procedure: RIGHT KNEE ARTHROSCOPY, CHONDROPLASTY AND PARTIAL MEDIAL LATERAL MENISCECTOMY;  Surgeon: Josefina Chew, MD;  Location: [facility];  Service: Orthopedics;  Laterality: Right;   SKIN CANCER EXCISION     TUBAL LIGATION   09/23/2000    Allergies: Allergies as of 08/05/2023 - Review Complete 08/05/2023  Allergen Reaction Noted   Stadol [butorphanol] Other (See Comments) 01/23/2021   Sulfa antibiotics Other (See Comments) 12/29/2016    Medications:  Current Outpatient Medications:    acyclovir  (ZOVIRAX ) 200 MG capsule, Take 1 capsule (200 mg total) by mouth 2 (two) times daily., Disp: 60 capsule, Rfl: 4   buPROPion  (WELLBUTRIN  XL) 150 MG 24 hr tablet, Take 1 tablet (150 mg total) by mouth daily., Disp: 90 tablet, Rfl: 6   cholecalciferol (VITAMIN D ) 1000 UNITS tablet, Take 2,000 Units by mouth daily. , Disp: , Rfl:    diclofenac  (VOLTAREN ) 75 MG EC tablet, Take 75 mg by mouth 3 (three) times daily., Disp: , Rfl:    estradiol  (ESTRACE  VAGINAL) 0.1 MG/GM vaginal cream, Place 1 g vaginally 3 (three) times a week., Disp: 42.5 g, Rfl: 3   gabapentin  (NEURONTIN ) 100 MG capsule, Take 1-2 capsules (100-200 mg total) by mouth 3 (three) times daily as needed., Disp: 180 capsule, Rfl: 0   imiquimod  (ALDARA ) 5 % cream, Apply topically 3 (three) times a week., Disp: 12 each, Rfl: 0   levothyroxine  (SYNTHROID ) 25 MCG tablet, Take 1 tablet (25 mcg total) by mouth in the morning on an empty stomach, Disp: 90 tablet, Rfl: 3   Lidocaine -Hydrocort , Perianal, 3-0.5 % CREA, Apply thin film locally to affected area up to 3 times daily as needed, avoid intravaginal application, Disp: 85 g, Rfl: 0  methylphenidate  18 MG PO CR tablet, Take 1 tablet (18 mg total) by mouth daily., Disp: 30 tablet, Rfl: 0   naloxone  (NARCAN ) nasal spray 4 mg/0.1 mL, Call 911. Administer a single spray in one nostril. If no to minimal response after 2-3 minutes, an additional dose may be given in the alternate nostril., Disp: 2 each, Rfl: PRN   OVER THE COUNTER MEDICATION, Hair/skin/nails ([employer] brand) 2,500 mcg biotin One daily, Disp: , Rfl:    Prasterone  (INTRAROSA ) 6.5 MG INST, Place 1 suppository vaginally at bedtime as needed., Disp: 30  each, Rfl: 12   rosuvastatin  (CRESTOR ) 5 MG tablet, Take 1 tablet (5 mg total) by mouth daily., Disp: 90 tablet, Rfl: 1   sertraline  (ZOLOFT ) 100 MG tablet, Take 2 tablets (200 mg total) by mouth daily., Disp: 180 tablet, Rfl: 5  Social History: Social History   Tobacco Use   Smoking status: Never   Smokeless tobacco: Never  Vaping Use   Vaping status: Never Used  Substance Use Topics   Alcohol use: Yes    Comment: 1 beer on the weekend   Drug use: No    Family Medical History: Family History  Problem Relation Age of Onset   Arthritis Mother    Bipolar disorder Mother    Anxiety disorder Mother    Depression Mother    Heart failure Mother    Cancer Mother        Colon,Lung,Kidney   Colon cancer Mother 83   Heart disease Father    Hypertension Father    Cancer Father        Skin   Depression Sister    Colon polyps Brother    Heart disease Brother    Hypertension Brother    Cancer Brother        Skin   Alcohol abuse Brother    Bipolar disorder Maternal Grandmother    Alcohol abuse Maternal Grandmother    Depression Maternal Grandmother    Diabetes Maternal Grandfather    Esophageal cancer Neg Hx    Rectal cancer Neg Hx    Stomach cancer Neg Hx    Inflammatory bowel disease Neg Hx    Liver disease Neg Hx    Pancreatic cancer Neg Hx     Physical Examination: Vitals:   08/05/23 1408  BP: 126/78    General: Patient is in no apparent distress. Attention to examination is appropriate.  Neck:   Supple.  Full range of motion.  Respiratory: Patient is breathing without any difficulty.   NEUROLOGICAL:     Awake, alert, oriented to person, place, and time.  Speech is clear and fluent.   Cranial Nerves: Pupils equal round and reactive to light.  Facial tone is symmetric. Shoulder shrug is symmetric. Tongue protrusion is midline.  There is no pronator drift.  Motor Exam:  She does show some mild thenar wasting bilaterally.  Left is worse than right.  Has a  positive Phalen test, positive carpal compression test bilaterally.  Decreased sensation in the bilateral upper extremities in the median nerve distribution.  Gait is normal.    Medical Decision Making  Electrodiagnostics: EMG & NCV Findings: Evaluation of the left median motor and the right median motor nerves showed prolonged distal onset latency (L6.9, R5.2 ms) and decreased conduction velocity (Elbow-Wrist, L48, R49 m/s).  The left median (across palm) sensory nerve showed no response (Wrist) and no response (Palm).  The right median (across palm) sensory nerve showed prolonged distal peak latency (Wrist,  5.2 ms) and prolonged distal peak latency (Palm, 2.3 ms).  All remaining nerves (as indicated in the following tables) were within normal limits.  Left vs. Right side comparison data for the median motor nerve indicates abnormal L-R latency difference (1.7 ms).  The ulnar motor nerve indicates abnormal L-R velocity difference (A Elbow-B Elbow, 19 m/s).  All remaining left vs. right side differences were within normal limits.     All examined muscles (as indicated in the following table) showed no evidence of electrical instability.     Impression: The above electrodiagnostic study is ABNORMAL and reveals evidence of: A severe left median nerve entrapment at the wrist (carpal tunnel syndrome) affecting sensory and motor components.  A moderate right median nerve entrapment at the wrist (carpal tunnel syndrome) affecting sensory and motor components.    There is no significant electrodiagnostic evidence of any other focal nerve entrapment, brachial plexopathy or cervical radiculopathy.  As you know, this particular electrodiagnostic study cannot rule out chemical radiculitis or sensory only radiculopathy.    **This electrodiagnostic study cannot rule out small fiber polyneuropathy and dysesthesias from central pain syndromes such as stroke or central pain sensitization syndromes such as  fibromyalgia.  Myotomal referral pain from trigger points is also not excluded.   Recommendations: 1.  Follow-up with referring physician. 2.  Continue current management of symptoms. 3.  Suggest surgical evaluation.   ___________________________ Prentice Eldonna BETTERS Board Certified, American Board of Physical Medicine and Rehabilitation    I have personally reviewed the images and electrodiagnostics and agree with the above interpretation.  Assessment and Plan: Ann Mendez is a pleasant 63 y.o. female with history of progressive carpal tunnel syndrome.  She has EMG diagnostics which demonstrate bilateral carpal tunnel syndrome left worse than right.  Symptomatically she is worse on the left than she is on the right.  She is here today for evaluation.  She would prefer a minimally invasive approach but would like to avoid general anesthesia if possible.  This is why she is discussing with me the ultrasound-guided approach to carpal tunnel decompression.  I discussed risk and benefits of procedure with her.  She does meet indications given her severe carpal tunnel syndrome on the left and moderate carpal tunnel syndrome on the right.  Both of these could be addressed.  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  Does not have any crossing nerve tissue which appears that it would be difficult or unsafe to perform ultrasound-guided approach.  Because of this she would like to go forward with ultrasound-guided carpal tunnel decompression.. The symptoms are causing a significant impact on the patient's life. I have utilized the care everywhere function in epic to review the outside records available from external health systems, and have personally reviewed relevant imaging and electrodiagnostic workup.    Thank you for involving me in the care of this patient.    Penne MICAEL Sharps MD/MSCR Neurosurgery - Peripheral  Nerve Surgery

## 2023-08-03 NOTE — Progress Notes (Signed)
 Ann Mendez - 63 y.o. female MRN [id number]  Date of birth: December 23, 1960  Office Visit Note: Visit Date: 08/03/2023 PCP: Katina Pfeiffer, PA-C Referred by: Katina Pfeiffer, PA-C  Subjective: No chief complaint on file.  HPI: Ann Mendez is a pleasant 63 y.o. female who presents today for evaluation of bilateral carpal tunnel syndrome.  She states that her symptoms have been present for multiple years, have worsened over this past year.  She is describing ongoing numbness in the radial aspect of bilateral hands as well as associated nocturnal symptoms.  She has done excessive bracing, previous therapy and has undergone prior injections with symptoms persistent to conservative measures.  She has undergone recent electrodiagnostic testing which did confirm diagnosis of bilateral carpal tunnel syndrome.  She states that she had been told previously in the past that she had a bifid median nerve particularly on the left side.  She is here today to discuss potential carpal tunnel release bilaterally.  Pertinent ROS were reviewed with the patient and found to be negative unless otherwise specified above in HPI.   Visit Reason:Bilateral carpal tunnel-[facility] referral-left is worse than right Duration of symptoms:off and on for years-worse last year Hand dominance: right Occupation: Relief work for [employer] Diabetic: No Smoking: No Heart/Lung History:none Blood Thinners: none  Prior Testing/EMG:EMG on 07/08/23 Injections (Date):3 injs in both since last spring Treatments:braces  Prior Surgery:none  Assessment & Plan: Visit Diagnoses:  1. Bilateral carpal tunnel syndrome     Plan: Extensive discussion was had with the patient today about her ongoing bilateral carpal tunnel syndrome that is refractory to conservative care.  Patient has both clinical and electrodiagnostic evidence to confirm this diagnosis.  At this juncture, she is indicated for bilateral, stage open versus  endoscopic carpal tunnel release.  Risks and benefits of both operations were discussed in detail today as well as forms of anesthesia.  She also has consultation with Dr. Penne Sharps later this week to discuss potential ultrasound-guided carpal tunnel release as well.  From an open versus endoscopic carpal tunnel release standpoint, risks include but not limited to infection, bleeding, scarring, stiffness, nerve injury or vascular, tendon injury, risk of recurrence and need for subsequent operation were all discussed in detail.  Patient described understanding the above.  She will return to me in the future for further discussion.  I spent 45 minutes in the care of this patient today including review of previous documentation, diagnostic information obtained, face-to-face time discussing all options regarding treatment and documenting the encounter.    Follow-up: No follow-ups on file.   Meds & Orders: No orders of the defined types were placed in this encounter.  No orders of the defined types were placed in this encounter.    Procedures: No procedures performed      Clinical History: CLINICAL DATA:  Inflammatory spondyloarthropathy.  Right neck pain.   EXAM: MRI CERVICAL SPINE WITHOUT CONTRAST   TECHNIQUE: Multiplanar, multisequence MR imaging of the cervical spine was performed. No intravenous contrast was administered.   COMPARISON:  08/28/2022 radiographs   FINDINGS: Alignment: Reversal the normal cervical lordosis with epicenter at C4-5. 2 mm degenerative retrolisthesis at C6-7. 1.5 mm degenerative anterolisthesis at C2-3 and C3-4.   Vertebrae: Disc desiccation loss of disc height throughout the cervical spine but especially at C4-5, C5-6, and C6-7. Degenerative endplate findings especially at C4-5, C5-6, and C6-7. Small Schmorl's node along the inferior endplate of C4. Schmorl's nodes along the anterior superior endplates at  C7 and T1. Large persistent ossiculum  terminale suspected, no overt os odontoideum.   Cord: No significant abnormal spinal cord signal is observed.   Posterior Fossa, vertebral arteries, paraspinal tissues: Unremarkable   Disc levels:   C2-3: No impingement.  Bilateral degenerative facet arthropathy   C3-4: Moderate right foraminal stenosis due to uncinate and facet spurring along with disc bulge. Central disc protrusion extends cephalad.   C4-5: Moderate to prominent left and moderate right foraminal stenosis due to disc osteophyte complex and facet arthropathy.   C5-6: Prominent left and moderate right foraminal stenosis and mild central narrowing of the thecal sac due to disc bulge, uncinate spurring, and facet arthropathy.   C6-7: Moderate left and mild right foraminal stenosis and mild central narrowing of the thecal sac due to disc osteophyte complex and facet arthropathy.   C7-T1: Borderline left foraminal stenosis due to facet spurring.   IMPRESSION: 1. Cervical spondylosis and degenerative disc disease causing prominent impingement at C5-6; moderate to prominent impingement at C4-5; moderate impingement at C3-4 and C6-7, as detailed above. 2. Reversal the normal cervical lordosis with epicenter at C4-5. 3. 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     Electronically Signed   By: Ryan Salvage M.D.   On: 10/20/2022 13:28  She reports that she has never smoked. She has never used smokeless tobacco. No results for input(s): HGBA1C, LABURIC in the last 8760 hours.  Objective:   Vital Signs: LMP 10/16/2011   Physical Exam  Gen: Well-appearing, in no acute distress; non-toxic CV: Regular Rate. Well-perfused. Warm.  Resp: Breathing unlabored on room air; no wheezing. Psych: Fluid speech in conversation; appropriate affect; normal thought process  Ortho Exam PHYSICAL EXAM:  General: Patient is well appearing and in no distress.   Skin and Muscle: No  significant skin changes are apparent to upper extremities.   Range of Motion and Palpation Tests: Mobility is full about the elbows with flexion and extension.  Forearm supination and pronation are 85/85 bilaterally.  Wrist flexion/extension is 75/65 bilaterally.  Digital flexion and extension are full.  Thumb opposition is full to the base of the small fingers bilaterally.    No cords or nodules are palpated.  No triggering is observed.     Neurologic, Vascular, Motor: Sensation is diminished to light touch in the bilateral median nerve distribution.    Thenar atrophy: Mild bilaterally, thumb opposition is preserved bilaterally Tinel sign: Positive bilateral carpal tunnel Carpal tunnel compression: Positive bilateral carpal tunnel Phalen test: Positive bilateral  Motor bilateral hand FPL: 5/5 Index FDP: 5/5 APB: 4+/5 bilaterally  Fingers pink and well perfused.  Capillary refill is brisk.     Lab Results  Component Value Date   HGBA1C 5.2 05/23/2011     Imaging: No results found.  Past Medical/Family/Surgical/Social History: Medications & Allergies reviewed per EMR, new medications updated. Patient Active Problem List   Diagnosis Date Noted   Constipation 04/25/2023   History of adenomatous and serrated colon polyps 04/25/2023   Acute pain of right shoulder 08/30/2020   Hallux rigidus, left foot 03/01/2019   Soft tissue mass 03/01/2019   Bunion 03/01/2019   Complex tear of medial meniscus of right knee 01/01/2017   Right knee pain 07/10/2016   Depression 11/11/2011   DJD (degenerative joint disease) 10/09/2011   Other and unspecified hyperlipidemia 10/09/2011   Menopause 10/09/2011   Spinal stenosis 10/09/2011   History of Bell's palsy 10/09/2011   Herpes progenitalis    Osteoarthritis  ABDOMINAL PAIN, LEFT LOWER QUADRANT 07/27/2007   BIPOLAR DISORDER UNSPECIFIED 07/26/2007   Past Medical History:  Diagnosis Date   Allergy    Anxiety    Arthritis     left great toe   Carpal tunnel syndrome    bilateral   Cervical spine degeneration    Chondromalacia of knee, 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   Complex tear of medial meniscus of right knee 01/01/2017   Depression    Hyperlipidemia    Lateral meniscus tear 12/2016   right medial   Thyroid  disease    Family History  Problem Relation Age of Onset   Arthritis Mother    Bipolar disorder Mother    Anxiety disorder Mother    Depression Mother    Heart failure Mother    Cancer Mother        Colon,Lung,Kidney   Colon cancer Mother 49   Heart disease Father    Hypertension Father    Cancer Father        Skin   Depression Sister    Colon polyps Brother    Heart disease Brother    Hypertension Brother    Cancer Brother        Skin   Alcohol abuse Brother    Bipolar disorder Maternal Grandmother    Alcohol abuse Maternal Grandmother    Depression Maternal Grandmother    Diabetes Maternal Grandfather    Esophageal cancer Neg Hx    Rectal cancer Neg Hx    Stomach cancer Neg Hx    Inflammatory bowel disease Neg Hx    Liver disease Neg Hx    Pancreatic cancer Neg Hx    Past Surgical History:  Procedure Laterality Date   CATARACT EXTRACTION W/ INTRAOCULAR LENS IMPLANT Right 03/2015   CESAREAN SECTION  09/23/2000   COLONOSCOPY     KNEE ARTHROSCOPY Right 01/01/2017   Procedure: RIGHT KNEE ARTHROSCOPY, CHONDROPLASTY AND PARTIAL MEDIAL LATERAL MENISCECTOMY;  Surgeon: Josefina Chew, MD;  Location: [facility];  Service: Orthopedics;  Laterality: Right;   SKIN CANCER EXCISION     TUBAL LIGATION  09/23/2000   Social History   Occupational History   Not on file  Tobacco Use   Smoking status: Never   Smokeless tobacco: Never  Vaping Use   Vaping status: Never Used  Substance and Sexual Activity   Alcohol use: Yes    Comment: 1 beer on the weekend   Drug use: No   Sexual activity: Yes    Birth control/protection: Surgical, Post-menopausal    Comment: 1st intercourse 63 yo-More  than 5 partners, btl    Art Levan Estela) Arlinda, M.D. [facility], Hand Surgery

## 2023-08-04 ENCOUNTER — Encounter (HOSPITAL_COMMUNITY): Payer: Self-pay

## 2023-08-04 DIAGNOSIS — F332 Major depressive disorder, recurrent severe without psychotic features: Secondary | ICD-10-CM | POA: Diagnosis not present

## 2023-08-05 ENCOUNTER — Other Ambulatory Visit: Payer: Self-pay

## 2023-08-05 ENCOUNTER — Encounter: Payer: Self-pay | Admitting: Neurosurgery

## 2023-08-05 ENCOUNTER — Ambulatory Visit: Admitting: Neurosurgery

## 2023-08-05 ENCOUNTER — Other Ambulatory Visit (HOSPITAL_COMMUNITY): Payer: Self-pay

## 2023-08-05 VITALS — BP 126/78 | Ht 61.0 in | Wt 113.0 lb

## 2023-08-05 DIAGNOSIS — G5603 Carpal tunnel syndrome, bilateral upper limbs: Secondary | ICD-10-CM

## 2023-08-05 DIAGNOSIS — G5602 Carpal tunnel syndrome, left upper limb: Secondary | ICD-10-CM

## 2023-08-05 DIAGNOSIS — G5601 Carpal tunnel syndrome, right upper limb: Secondary | ICD-10-CM

## 2023-08-05 DIAGNOSIS — Z01818 Encounter for other preprocedural examination: Secondary | ICD-10-CM

## 2023-08-05 NOTE — Patient Instructions (Signed)
 Please see below for information in regards to your upcoming surgery:   Planned surgery: 1) left carpal tunnel release with ultrasound guidance; 2) right carpal tunnel release with ultrasound guidance   Surgery date: 08/18/23 & 10/06/23 at [facility] (Medical Mall: [address] [place], [place] [address]) - you will find out your arrival time the business day before your surgery.   Pre-op appointment at [facility] Pre-admit Testing: you will receive a call with a date/time for this appointment. If you are scheduled for an in person appointment, Pre-admit Testing is located on the first floor of the [facility] Arts building, [address], [address]. During this appointment, they will advise you which medications you can take the morning of surgery, and which medications you will need to hold for surgery. Labs (such as blood work, EKG) may be done at your pre-op appointment. You are not required to fast for these labs. Should you need to change your pre-op appointment, please call Pre-admit testing at [phone].     How to contact us :  If you have any questions/concerns before or after surgery, you can reach us  at [phone], or you can send a mychart message. We can be reached by phone or mychart 8am-4pm, Monday-Friday.  *Please note: Calls after 4pm are forwarded to a third party answering service. Mychart messages are not routinely monitored during evenings, weekends, and holidays. Please call our office to contact the answering service for urgent concerns during non-business hours.    If you have FMLA/disability paperwork, please drop it off or fax it to [phone]   Appointments/FMLA & disability paperwork: Reche [employer] Registered Nurse/Surgery scheduler: Meaghann Choo, RN Certified Medical Assistants: Don, CMA, Elenor, CMA, & Damien, CMA Physician Assistants: Lyle Decamp, PA-C, Edsel Goods, PA-C & Glade Boys, PA-C Surgeons: Penne Sharps, MD & Reeves Daisy, MD   [facility] PREADMIT TESTING VISIT and SURGERY INFORMATION SHEET   Now that surgery has been scheduled you can anticipate several phone calls from [facility] services. A pharmacy technician will call you to verify your current list of medications taken at home.               The Pre-Service Center will call to verify your insurance information and to give you billing estimates and information.             The Preadmit Testing Office will be calling to schedule a visit to obtain information for the anesthesia team and provide instructions on preparation for surgery.  What can you expect for the Preadmit Testing Visit: Appointments may be scheduled in-person or by telephone.  If a telephone visit is scheduled, you may be asked to come into the office to have lab tests or other studies performed.   This visit will not be completed any greater than 14 days prior to your surgery.  If your surgery has been scheduled for a future date, please do not be alarmed if we have not contacted you to schedule an appointment more than a month prior to the surgery date.    Please be prepared to provide the following information during this appointment:            -Personal medical history                                               -  Medication and allergy list            -Any history of problems with anesthesia              -Recent lab work or diagnostic studies            -Please notify us  of any needs we should be aware of to provide the best care possible           -You will be provided with instructions on how to prepare for your surgery.    On The Day of Surgery:  You must have a driver to take you home after surgery, you will be asked not to drive for 24 hours following surgery.  Taxi, Gisele and non-medical transport will not be acceptable means of transportation unless you have a responsible individual who will be traveling with  you.  Visitors in the surgical area:   2 people will be able to visit you in your room once your preparation for surgery has been completed. During surgery, your visitors will be asked to wait in the Surgery Waiting Area.  It is not a requirement for them to stay, if they prefer to leave and come back.  Your visitor(s) will be given an update once the surgery has been completed.  No visitors are allowed in the initial recovery room to respect patient privacy and safety.  Once you are more awake and transfer to the secondary recovery area, or are transferred to an inpatient room, visitors will again be able to see you.  To respect and protect your privacy: We will ask on the day of surgery who your driver will be and what the contact number for that individual will be. We will ask if it is okay to share information with this individual, or if there is an alternative individual that we, or the surgeon, should contact to provide updates and information. If family or friends come to the surgical information desk requesting information about you, who you have not listed with us , no information will be given.   It may be helpful to designate someone as the main contact who will be responsible for updating your other friends and family.    PREADMIT TESTING OFFICE: [phone] SAME DAY SURGERY: [phone] We look forward to caring for you before and throughout the process of your surgery.

## 2023-08-05 NOTE — Telephone Encounter (Signed)
 1 gram 3 times a week is correct.

## 2023-08-07 ENCOUNTER — Other Ambulatory Visit (HOSPITAL_COMMUNITY): Payer: Self-pay

## 2023-08-07 MED ORDER — METHYLPHENIDATE HCL ER (OSM) 18 MG PO TBCR
18.0000 mg | EXTENDED_RELEASE_TABLET | Freq: Every day | ORAL | 0 refills | Status: DC
  Filled 2023-08-12: qty 30, 30d supply, fill #0

## 2023-08-11 ENCOUNTER — Other Ambulatory Visit: Payer: Self-pay

## 2023-08-11 ENCOUNTER — Encounter
Admission: RE | Admit: 2023-08-11 | Discharge: 2023-08-11 | Disposition: A | Discharge status: Home / Self Care | Source: Ambulatory Visit | Attending: Neurosurgery | Admitting: Neurosurgery

## 2023-08-11 DIAGNOSIS — Z01812 Encounter for preprocedural laboratory examination: Secondary | ICD-10-CM

## 2023-08-11 DIAGNOSIS — E7849 Other hyperlipidemia: Secondary | ICD-10-CM

## 2023-08-11 HISTORY — DX: Hypothyroidism, unspecified: E03.9

## 2023-08-11 HISTORY — DX: Malignant (primary) neoplasm, unspecified: C80.1

## 2023-08-11 HISTORY — DX: Headache, unspecified: R51.9

## 2023-08-11 NOTE — Patient Instructions (Addendum)
 Your procedure is scheduled on: 08/18/23 - Tuesday Report to the Registration Desk on the 1st floor of the Medical Mall. To find out your arrival time, please call [phone] between 1PM - 3PM on: 08/17/23 - Monday If your arrival time is 6:00 am, do not arrive before that time as the Medical Mall entrance doors do not open until 6:00 am.  REMEMBER: Instructions that are not followed completely may result in serious medical risk, up to and including death; or upon the discretion of your surgeon and anesthesiologist your surgery may need to be rescheduled.  Do not eat food after midnight the night before surgery.  No gum chewing or hard candies.  You may however, drink CLEAR liquids up to 2 hours before you are scheduled to arrive for your surgery. Do not drink anything within 2 hours of your scheduled arrival time.  Clear liquids include: - water  - apple juice without pulp - gatorade (not RED colors) - black coffee or tea (Do NOT add milk or creamers to the coffee or tea) Do NOT drink anything that is not on this list.   One week prior to surgery: Stop Anti-inflammatories (NSAIDS) such as diclofenac  (VOLTAREN ) ,Advil, Aleve, Ibuprofen, Motrin, Naproxen, Naprosyn and Aspirin based products such as Excedrin, Goody's Powder, BC Powder.You may take Tylenol  if needed for pain up until the day of surgery.  Stop ANY OVER THE COUNTER supplements until after surgery. VITAMIN D  ,Multiple Vitamins-Minerals (HAIR SKIN & NAILS).   ON THE DAY OF SURGERY ONLY TAKE THESE MEDICATIONS WITH SIPS OF WATER:  buPROPion  (WELLBUTRIN  XL)  levothyroxine  (SYNTHROID )  sertraline  (ZOLOFT )  methylphenidate     No Alcohol for 24 hours before or after surgery.  No Smoking including e-cigarettes for 24 hours before surgery.  No chewable tobacco products for at least 6 hours before surgery.  No nicotine patches on the day of surgery.  Do not use any recreational drugs for at least a week (preferably 2  weeks) before your surgery.  Please be advised that the combination of cocaine and anesthesia may have negative outcomes, up to and including death. If you test positive for cocaine, your surgery will be cancelled.  On the morning of surgery brush your teeth with toothpaste and water, you may rinse your mouth with mouthwash if you wish. Do not swallow any toothpaste or mouthwash.  Use CHG Soap or wipes as directed on instruction sheet.  Do not wear jewelry, make-up, hairpins, clips or nail polish.  For welded (permanent) jewelry: bracelets, anklets, waist bands, etc.  Please have this removed prior to surgery.  If it is not removed, there is a chance that hospital personnel will need to cut it off on the day of surgery.  Do not wear lotions, powders, or perfumes.   Do not shave body hair from the neck down 48 hours before surgery.  Contact lenses, hearing aids and dentures may not be worn into surgery.  Do not bring valuables to the hospital. [facility] is not responsible for any missing/lost belongings or valuables.   Notify your doctor if there is any change in your medical condition (cold, fever, infection).  Wear comfortable clothing (specific to your surgery type) to the hospital.  After surgery, you can help prevent lung complications by doing breathing exercises.  Take deep breaths and cough every 1-2 hours. Your doctor may order a device called an Incentive Spirometer to help you take deep breaths.  When coughing or sneezing, hold a pillow firmly against  your incision with both hands. This is called "splinting." Doing this helps protect your incision. It also decreases belly discomfort.  If you are being admitted to the hospital overnight, leave your suitcase in the car. After surgery it may be brought to your room.  In case of increased patient census, it may be necessary for you, the patient, to continue your postoperative care in the Same Day Surgery department.  If you  are being discharged the day of surgery, you will not be allowed to drive home. You will need a responsible individual to drive you home and stay with you for 24 hours after surgery.   If you are taking public transportation, you will need to have a responsible individual with you.  Please call the Pre-admissions Testing Dept. at [phone] if you have any questions about these instructions.  Surgery Visitation Policy:  Patients having surgery or a procedure may have two visitors.  Children under the age of 109 must have an adult with them who is not the patient.  Inpatient Visitation:    Visiting hours are 7 a.m. to 8 p.m. Up to four visitors are allowed at one time in a patient room. The visitors may rotate out with other people during the day.  One visitor age 48 or older may stay with the patient overnight and must be in the room by 8 p.m.   Merchandiser, retail to address health-related social needs:  [email or web address]    Preparing for Surgery with CHLORHEXIDINE  GLUCONATE (CHG) Soap  Chlorhexidine  Gluconate (CHG) Soap  o An antiseptic cleaner that kills germs and bonds with the skin to continue killing germs even after washing  o Used for showering the night before surgery and morning of surgery  Before surgery, you can play an important role by reducing the number of germs on your skin.  CHG (Chlorhexidine  gluconate) soap is an antiseptic cleanser which kills germs and bonds with the skin to continue killing germs even after washing.  Please do not use if you have an allergy to CHG or antibacterial soaps. If your skin becomes reddened/irritated stop using the CHG.  1. Shower the NIGHT BEFORE SURGERY and the MORNING OF SURGERY with CHG soap.  2. If you choose to wash your hair, wash your hair first as usual with your normal shampoo.  3. After shampooing, rinse your hair and body thoroughly to remove the shampoo.  4. Use CHG as you would any  other liquid soap. You can apply CHG directly to the skin and wash gently with a scrungie or a clean washcloth.  5. Apply the CHG soap to your body only from the neck down. Do not use on open wounds or open sores. Avoid contact with your eyes, ears, mouth, and genitals (private parts). Wash face and genitals (private parts) with your normal soap.  6. Wash thoroughly, paying special attention to the area where your surgery will be performed.  7. Thoroughly rinse your body with warm water.  8. Do not shower/wash with your normal soap after using and rinsing off the CHG soap.  9. Pat yourself dry with a clean towel.  10. Wear clean pajamas to bed the night before surgery.  12. Place clean sheets on your bed the night of your first shower and do not sleep with pets.  13. Shower again with the CHG soap on the day of surgery prior to arriving at the hospital.  14. Do not apply any deodorants/lotions/powders.  15. Please wear clean  clothes to the hospital.

## 2023-08-12 ENCOUNTER — Other Ambulatory Visit (HOSPITAL_COMMUNITY): Payer: Self-pay

## 2023-08-13 ENCOUNTER — Encounter
Admission: RE | Admit: 2023-08-13 | Discharge: 2023-08-13 | Disposition: A | Discharge status: Home / Self Care | Source: Ambulatory Visit | Attending: Neurosurgery | Admitting: Neurosurgery

## 2023-08-13 DIAGNOSIS — Z01818 Encounter for other preprocedural examination: Secondary | ICD-10-CM | POA: Insufficient documentation

## 2023-08-13 DIAGNOSIS — Z01812 Encounter for preprocedural laboratory examination: Secondary | ICD-10-CM

## 2023-08-13 DIAGNOSIS — E7849 Other hyperlipidemia: Secondary | ICD-10-CM | POA: Diagnosis not present

## 2023-08-13 LAB — CBC
HCT: 38.8 % (ref 36.0–46.0)
Hemoglobin: 13.1 g/dL (ref 12.0–15.0)
MCH: 31.3 pg (ref 26.0–34.0)
MCHC: 33.8 g/dL (ref 30.0–36.0)
MCV: 92.6 fL (ref 80.0–100.0)
Platelets: 344 K/uL (ref 150–400)
RBC: 4.19 MIL/uL (ref 3.87–5.11)
RDW: 11.9 % (ref 11.5–15.5)
WBC: 6.7 K/uL (ref 4.0–10.5)
nRBC: 0 % (ref 0.0–0.2)

## 2023-08-13 LAB — BASIC METABOLIC PANEL WITH GFR
Anion gap: 6 (ref 5–15)
BUN: 13 mg/dL (ref 8–23)
CO2: 29 mmol/L (ref 22–32)
Calcium: 10 mg/dL (ref 8.9–10.3)
Chloride: 107 mmol/L (ref 98–111)
Creatinine, Ser: 0.6 mg/dL (ref 0.44–1.00)
GFR, Estimated: >60 mL/min (ref >60)
Glucose, Bld: 103 mg/dL — ABNORMAL HIGH (ref 70–99)
Potassium: 5.1 mmol/L (ref 3.5–5.1)
Sodium: 142 mmol/L (ref 135–145)

## 2023-08-17 ENCOUNTER — Ambulatory Visit: Payer: Self-pay | Admitting: Neurosurgery

## 2023-08-17 DIAGNOSIS — G5602 Carpal tunnel syndrome, left upper limb: Secondary | ICD-10-CM | POA: Insufficient documentation

## 2023-08-17 NOTE — H&P (Signed)
 Delete, entered in duplicate

## 2023-08-18 ENCOUNTER — Other Ambulatory Visit (HOSPITAL_COMMUNITY): Payer: Self-pay

## 2023-08-18 ENCOUNTER — Encounter: Payer: Self-pay | Admitting: Neurosurgery

## 2023-08-18 ENCOUNTER — Encounter
Admission: RE | Disposition: A | Discharge status: Home / Self Care | Payer: Self-pay | Source: Home / Self Care | Attending: Neurosurgery

## 2023-08-18 ENCOUNTER — Other Ambulatory Visit: Payer: Self-pay

## 2023-08-18 ENCOUNTER — Ambulatory Visit: Payer: Self-pay | Admitting: Urgent Care

## 2023-08-18 ENCOUNTER — Ambulatory Visit: Payer: Self-pay

## 2023-08-18 ENCOUNTER — Ambulatory Visit
Admission: RE | Admit: 2023-08-18 | Discharge: 2023-08-18 | Disposition: A | Discharge status: Home / Self Care | Attending: Neurosurgery | Admitting: Neurosurgery

## 2023-08-18 DIAGNOSIS — Z79890 Hormone replacement therapy (postmenopausal): Secondary | ICD-10-CM | POA: Insufficient documentation

## 2023-08-18 DIAGNOSIS — G5602 Carpal tunnel syndrome, left upper limb: Secondary | ICD-10-CM | POA: Diagnosis not present

## 2023-08-18 DIAGNOSIS — E039 Hypothyroidism, unspecified: Secondary | ICD-10-CM | POA: Insufficient documentation

## 2023-08-18 DIAGNOSIS — Z01818 Encounter for other preprocedural examination: Secondary | ICD-10-CM

## 2023-08-18 HISTORY — PX: CARPAL TUNNEL RELEASE: SHX101

## 2023-08-18 SURGERY — CARPAL TUNNEL RELEASE
Anesthesia: General | Laterality: Left

## 2023-08-18 MED ORDER — OXYCODONE HCL 5 MG/5ML PO SOLN: 5.0000 mg | Freq: Once | ORAL | Status: AC | PRN

## 2023-08-18 MED ORDER — OXYCODONE HCL 5 MG PO TABS
ORAL_TABLET | ORAL | Status: AC
Start: 2023-08-18 — End: 2023-08-18
  Filled 2023-08-18: qty 1

## 2023-08-18 MED ORDER — ACETAMINOPHEN 10 MG/ML IV SOLN: 1000.0000 mg | Freq: Once | INTRAVENOUS | Status: DC | PRN

## 2023-08-18 MED ORDER — HYDROCODONE-ACETAMINOPHEN 5-325 MG PO TABS
1.0000 | ORAL_TABLET | Freq: Four times a day (QID) | ORAL | 0 refills | Status: DC | PRN
  Filled 2023-08-18: qty 30, 8d supply, fill #0
  Filled 2023-08-18: qty 30, 7d supply, fill #0

## 2023-08-18 MED ORDER — LIDOCAINE HCL (PF) 2 % IJ SOLN
INTRAMUSCULAR | Status: AC
Start: 2023-08-18 — End: 2023-08-18
  Filled 2023-08-18: qty 5

## 2023-08-18 MED ORDER — LACTATED RINGERS IV SOLN: INTRAVENOUS | Status: DC

## 2023-08-18 MED ORDER — CHLORHEXIDINE GLUCONATE 0.12 % MT SOLN
15.0000 mL | Freq: Once | OROMUCOSAL | Status: AC
  Administered 2023-08-18: 15 mL via OROMUCOSAL

## 2023-08-18 MED ORDER — CEFAZOLIN SODIUM-DEXTROSE 2-4 GM/100ML-% IV SOLN
2.0000 g | Freq: Once | INTRAVENOUS | Status: AC
  Administered 2023-08-18: 2 g via INTRAVENOUS

## 2023-08-18 MED ORDER — FENTANYL CITRATE (PF) 100 MCG/2ML IJ SOLN
INTRAMUSCULAR | Status: DC | PRN
  Administered 2023-08-18 (×2): 25 ug via INTRAVENOUS

## 2023-08-18 MED ORDER — CHLORHEXIDINE GLUCONATE 0.12 % MT SOLN
OROMUCOSAL | Status: AC
  Filled 2023-08-18: qty 15

## 2023-08-18 MED ORDER — ONDANSETRON HCL 4 MG/2ML IJ SOLN
INTRAMUSCULAR | Status: DC | PRN
  Administered 2023-08-18: 4 mg via INTRAVENOUS

## 2023-08-18 MED ORDER — OXYCODONE HCL 5 MG PO TABS
5.0000 mg | ORAL_TABLET | Freq: Once | ORAL | Status: AC | PRN
  Administered 2023-08-18: 5 mg via ORAL

## 2023-08-18 MED ORDER — ORAL CARE MOUTH RINSE: 15.0000 mL | Freq: Once | OROMUCOSAL | Status: AC

## 2023-08-18 MED ORDER — DEXAMETHASONE SODIUM PHOSPHATE 10 MG/ML IJ SOLN
INTRAMUSCULAR | Status: AC
  Filled 2023-08-18: qty 1

## 2023-08-18 MED ORDER — FENTANYL CITRATE (PF) 100 MCG/2ML IJ SOLN: 25.0000 ug | INTRAMUSCULAR | Status: DC | PRN

## 2023-08-18 MED ORDER — ONDANSETRON HCL 4 MG/2ML IJ SOLN
INTRAMUSCULAR | Status: AC
  Filled 2023-08-18: qty 2

## 2023-08-18 MED ORDER — MIDAZOLAM HCL 2 MG/2ML IJ SOLN
INTRAMUSCULAR | Status: DC | PRN
  Administered 2023-08-18: 2 mg via INTRAVENOUS

## 2023-08-18 MED ORDER — LIDOCAINE HCL (CARDIAC) PF 100 MG/5ML IV SOSY
PREFILLED_SYRINGE | INTRAVENOUS | Status: DC | PRN
  Administered 2023-08-18: 50 mg via INTRAVENOUS

## 2023-08-18 MED ORDER — FENTANYL CITRATE (PF) 100 MCG/2ML IJ SOLN
INTRAMUSCULAR | Status: AC
  Filled 2023-08-18: qty 2

## 2023-08-18 MED ORDER — LIDOCAINE HCL 1 % IJ SOLN
INTRAMUSCULAR | Status: DC | PRN
  Administered 2023-08-18: 5 mL

## 2023-08-18 MED ORDER — MIDAZOLAM HCL 2 MG/2ML IJ SOLN
INTRAMUSCULAR | Status: AC
  Filled 2023-08-18: qty 2

## 2023-08-18 MED ORDER — CEFAZOLIN SODIUM-DEXTROSE 2-4 GM/100ML-% IV SOLN
INTRAVENOUS | Status: AC
  Filled 2023-08-18: qty 100

## 2023-08-18 MED ORDER — DROPERIDOL 2.5 MG/ML IJ SOLN: 0.6250 mg | Freq: Once | INTRAMUSCULAR | Status: DC | PRN

## 2023-08-18 MED ORDER — PROPOFOL 500 MG/50ML IV EMUL
INTRAVENOUS | Status: DC | PRN
  Administered 2023-08-18: 100 ug/kg/min via INTRAVENOUS

## 2023-08-18 SURGICAL SUPPLY — 20 items
BNDG ADH 1X3 SHEER STRL LF (GAUZE/BANDAGES/DRESSINGS) ×1 IMPLANT
BNDG ADH 2 X3.75 FABRIC TAN LF (GAUZE/BANDAGES/DRESSINGS) IMPLANT
BRUSH SCRUB EZ 4% CHG (MISCELLANEOUS) ×1 IMPLANT
CHLORAPREP W/TINT 26 (MISCELLANEOUS) ×1 IMPLANT
COVER PROBE FLX POLY STRL (MISCELLANEOUS) ×1 IMPLANT
DERMABOND ADVANCED .7 DNX12 (GAUZE/BANDAGES/DRESSINGS) ×1 IMPLANT
DRAPE EXTREMITY 106X87X128.5 (DRAPES) ×1 IMPLANT
DRAPE IMP U-DRAPE 54X76 (DRAPES) ×1 IMPLANT
DRAPE SHEET LG 3/4 BI-LAMINATE (DRAPES) ×1 IMPLANT
GLOVE BIOGEL PI IND STRL 8 (GLOVE) ×1 IMPLANT
GLOVE SRG 8 PF TXTR STRL LF DI (GLOVE) ×1 IMPLANT
GLOVE SURG SYN 7.5 PF PI (GLOVE) ×1 IMPLANT
GOWN SRG XL LVL 3 NONREINFORCE (GOWNS) ×1 IMPLANT
KIT TURNOVER KIT A (KITS) ×1 IMPLANT
NDL HYPO 25X1 1.5 SAFETY (NEEDLE) ×1 IMPLANT
NEEDLE HYPO 25X1 1.5 SAFETY (NEEDLE) ×1 IMPLANT
NS IRRIG 500ML POUR BTL (IV SOLUTION) ×1 IMPLANT
PACK BASIN MINOR ARMC (MISCELLANEOUS) ×1 IMPLANT
STOCKINETTE IMPERVIOUS 9X36 MD (GAUZE/BANDAGES/DRESSINGS) ×1 IMPLANT
ULTRAGLIDE CTR (BLADE) ×1 IMPLANT

## 2023-08-18 NOTE — Interval H&P Note (Signed)
 History and Physical Interval Note:  08/18/2023 7:49 AM  Ann Mendez  has presented today for surgery, with the diagnosis of G56.02 left carpal tunnel syndrome.  The various methods of treatment have been discussed with the patient and family. After consideration of risks, benefits and other options for treatment, the patient has consented to  Procedure(s) with comments: CARPAL TUNNEL RELEASE (Left) - LEFT CARPAL TUNNEL RELEASE WITH ULTRASOUND GUIDANCE as a surgical intervention.  The patient's history has been reviewed, patient examined, no change in status, stable for surgery.  I have reviewed the patient's chart and labs.  Questions were answered to the patient's satisfaction.     [name]

## 2023-08-18 NOTE — Anesthesia Preprocedure Evaluation (Addendum)
 Anesthesia Evaluation  Patient identified by MRN, date of birth, ID band Patient awake    Reviewed: Allergy & Precautions, H&P , NPO status , Patient's Chart, lab work & pertinent test results  Airway Mallampati: III  TM Distance: >3 FB Neck ROM: full    Dental no notable dental hx.    Pulmonary neg pulmonary ROS   Pulmonary exam normal        Cardiovascular negative cardio ROS Normal cardiovascular exam     Neuro/Psych  PSYCHIATRIC DISORDERS Anxiety Depression       GI/Hepatic negative GI ROS, Neg liver ROS,,,  Endo/Other  Hypothyroidism    Renal/GU negative Renal ROS  negative genitourinary   Musculoskeletal  (+) Arthritis ,    Abdominal Normal abdominal exam  (+)   Peds  Hematology negative hematology ROS (+)   Anesthesia Other Findings Past Medical History: No date: Allergy No date: Anxiety No date: Arthritis     Comment:  left great toe No date: Cancer [facility])     Comment:  skin No date: Carpal tunnel syndrome     Comment:  bilateral No date: 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, right 01/01/2017: Complex tear of medial meniscus of right knee No date: Depression No date: Headache     Comment:  orgasm HA No date: Hyperlipidemia No date: Hypothyroidism 12/2016: Lateral meniscus tear     Comment:  right medial No date: Thyroid  disease  Past Surgical History: 03/2015: CATARACT EXTRACTION W/ INTRAOCULAR LENS IMPLANT; Right 09/23/2000: CESAREAN SECTION No date: COLONOSCOPY     Comment:  x 3 01/01/2017: KNEE ARTHROSCOPY; Right     Comment:  Procedure: RIGHT KNEE ARTHROSCOPY, CHONDROPLASTY AND               PARTIAL MEDIAL LATERAL MENISCECTOMY;  Surgeon: Josefina Chew, MD;  Location: [facility];                Service: Orthopedics;  Laterality: Right; No date: SKIN CANCER EXCISION     Comment:  x 2 09/23/2000: TUBAL LIGATION      Reproductive/Obstetrics negative OB ROS                              Anesthesia Physical Anesthesia Plan  ASA: 2  Anesthesia Plan: General   Post-op Pain Management: Minimal or no pain anticipated   Induction: Intravenous  PONV Risk Score and Plan: Propofol  infusion and TIVA  Airway Management Planned: Natural Airway and Nasal Cannula  Additional Equipment:   Intra-op Plan:   Post-operative Plan:   Informed Consent: I have reviewed the patients History and Physical, chart, labs and discussed the procedure including the risks, benefits and alternatives for the proposed anesthesia with the patient or authorized representative who has indicated his/her understanding and acceptance.     Dental Advisory Given  Plan Discussed with: CRNA and Surgeon  Anesthesia Plan Comments:          Anesthesia Quick Evaluation

## 2023-08-18 NOTE — H&P (Signed)
 **Chief Complaint:**   Carpal tunnel syndrome -left hand.  **History of Present Illness:**   Ann Mendez is a 63 y.o.-year-old female presenting with symptoms consistent with carpal tunnel syndrome, including numbness, tingling, and/or pain in the median nerve distribution. Symptoms have persisted for multiple years, worsening over the past year and are continuing to worsen.    Conservative treatments attempted include:   - Night splinting   - NSAIDs   - Activity modification   - Corticosteroid injection These have provided minimal relief.   Electrodiagnostic studies confirm median nerve compression at the wrist.   Past Medical History:  Diagnosis Date   Allergy    Anxiety    Arthritis    left great toe   Cancer (HCC)    skin   Carpal tunnel syndrome    bilateral   Cervical spine degeneration    Chondromalacia of knee, 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   Complex tear of medial meniscus of right knee 01/01/2017   Depression    Headache    orgasm HA   Hyperlipidemia    Hypothyroidism    Lateral meniscus tear 12/2016   right medial   Thyroid  disease      **Past Surgical History:**    Past Surgical History:  Procedure Laterality Date   CATARACT EXTRACTION W/ INTRAOCULAR LENS IMPLANT Right 03/2015   CESAREAN SECTION  09/23/2000   COLONOSCOPY     x 3   KNEE ARTHROSCOPY Right 01/01/2017   Procedure: RIGHT KNEE ARTHROSCOPY, CHONDROPLASTY AND PARTIAL MEDIAL LATERAL MENISCECTOMY;  Surgeon: Josefina Chew, MD;  Location: [facility];  Service: Orthopedics;  Laterality: Right;   SKIN CANCER EXCISION     x 2   TUBAL LIGATION  09/23/2000    **Medications:**    Current Outpatient Medications  Medication Instructions   acyclovir  (ZOVIRAX ) 200 mg, Oral, 2 times daily   buPROPion  (WELLBUTRIN  XL) 150 mg, Oral, Daily   cholecalciferol (VITAMIN D ) 2,000 Units, Daily   diclofenac  (VOLTAREN ) 75 mg, Oral, Every 3 DAYS   estradiol  (ESTRACE  VAGINAL) 1 g, Vaginal, 3 times weekly    gabapentin  (NEURONTIN ) 100-200 mg, Oral, 3 times daily PRN   imiquimod  (ALDARA ) 5 % cream Topical, 3 times weekly   levothyroxine  (SYNTHROID ) 25 MCG tablet Take 1 tablet (25 mcg total) by mouth in the morning on an empty stomach   Lidocaine -Hydrocort , Perianal, 3-0.5 % CREA Apply thin film locally to affected area up to 3 times daily as needed, avoid intravaginal application   methylphenidate  (CONCERTA ) 18 mg, Oral, Daily   Multiple Vitamins-Minerals (HAIR SKIN & NAILS) TABS 1 tablet, Daily   naloxone  (NARCAN ) nasal spray 4 mg/0.1 mL Call 911. Administer a single spray in one nostril. If no to minimal response after 2-3 minutes, an additional dose may be given in the alternate nostril.   Prasterone  (INTRAROSA ) 6.5 MG INST 1 suppository, Vaginal, At bedtime PRN   rosuvastatin  (CRESTOR ) 5 mg, Oral, Daily   sertraline  (ZOLOFT ) 200 mg, Oral, Daily     **Allergies:**    Allergies  Allergen Reactions   Stadol [Butorphanol] Other (See Comments)    Patient reports received in 1998, increased symptoms of HELLP   Sulfa Antibiotics Other (See Comments)    UNKNOWN     **Social History:**    reports that she has never smoked. She has never used smokeless tobacco. She reports that she does not currently use alcohol. She reports that she does not use drugs.  **Family History:**   family history includes Alcohol abuse  in her brother and maternal grandmother; Anxiety disorder in her mother; Arthritis in her mother; Bipolar disorder in her maternal grandmother and mother; Cancer in her brother, father, and mother; Colon cancer (age of onset: 48) in her mother; Colon polyps in her brother; Depression in her maternal grandmother, mother, and sister; Diabetes in her maternal grandfather; Heart disease in her brother and father; Heart failure in her mother; Hypertension in her brother and father.  **Review of Systems:**   Negative except as noted in HPI.  **Physical Exam:**   - **General:**  Well-appearing, no acute distress.   - **Extremities:** Thenar muscle atrophy mild and present.  The rate and rhythm, no audible breath sounds - **Neurologic:**     - Positive Tinel's sign     - Positive Phalen's test     - Decreased sensation in median nerve distribution     - Weakness in thumb opposition mild and present  **Impression:**   Carpal tunnel syndrome, left.   Indication for surgical intervention based on:   - Persistent symptoms multiple years - Failure of conservative management   - Confirmatory electrodiagnostic testing  **Plan:**   Proceed with carpal tunnel release surgery using ultrasound-guided technique.   Discussed risks including but not limited to infection, nerve injury, incomplete relief, we also discussed benefits and alternatives.   Informed consent obtained.  **Preoperative Clearance:**   Patient is medically optimized for surgery.   BP 133/72   Pulse 75   Temp (!) 97 F (36.1 C) (Temporal)   Resp 16   LMP 10/16/2011   SpO2 98%     Recent Results (from the past 2160 hours)  Surgical pathology ([facility])     Status: None   Collection Time: 05/22/23 12:00 AM  Result Value Ref Range   SURGICAL PATHOLOGY      SURGICAL PATHOLOGY [facility] [address] [place], [place] [address] Telephone [phone] or [phone] Fax [phone]  REPORT OF SURGICAL PATHOLOGY   Accession #: [id number] Patient Name: Ann, Mendez Visit # : [id number]  MRN: [id number] Physician: Wilhelmenia Roers DOB/Age May 27, 1960 (Age: 28) Gender: F Collected Date: 05/22/2023 Received Date: 05/26/2023  FINAL DIAGNOSIS       1. Surgical [P], colon, transverse polyp x1, polyp (1) :       - TUBULAR ADENOMA      - NEGATIVE FOR HIGH-GRADE DYSPLASIA OR MALIGNANCY       DATE SIGNED OUT: 05/27/2023 ELECTRONIC SIGNATURE : Rebbecca Md, Nilesh, Pathologist, Electronic Signature  MICROSCOPIC DESCRIPTION  CASE COMMENTS STAINS  USED IN DIAGNOSIS: H&E    CLINICAL HISTORY  SPECIMEN(S) OBTAINED 1. Surgical [P], Colon, Transverse Polyp X1, Polyp (1)  SPECIMEN COMMENTS: 1. Hx of adenomatous colonic polyps; benign neoplasm of transverse colon SPECIMEN CLINICAL  INFORMATION: 1. R/O adenoma    Gross Description 1. Received in formalin are tan, soft tissue fragments that are submitted in toto. Number: 1 Size: 1.2 cm, (1B) ( TA )        Report signed out from the following location(s) [place] [address] [place], [place] [address] CLIA #: [id number]  [facility] [address] [place], [place] [address] CLIA #: [id number]   CBC     Status: None   Collection Time: 08/13/23  3:52 PM  Result Value Ref Range   WBC 6.7 4.0 - 10.5 K/uL   RBC 4.19 3.87 - 5.11 MIL/uL   Hemoglobin 13.1 12.0 - 15.0 g/dL  HCT 38.8 36.0 - 46.0 %   MCV 92.6 80.0 - 100.0 fL   MCH 31.3 26.0 - 34.0 pg   MCHC 33.8 30.0 - 36.0 g/dL   RDW 88.0 88.4 - 84.4 %   Platelets 344 150 - 400 K/uL   nRBC 0.0 0.0 - 0.2 %    Comment: Performed at [facility], [address] [place], [place] [address]  Basic metabolic panel     Status: Abnormal   Collection Time: 08/13/23  3:52 PM  Result Value Ref Range   Sodium 142 135 - 145 mmol/L   Potassium 5.1 3.5 - 5.1 mmol/L   Chloride 107 98 - 111 mmol/L   CO2 29 22 - 32 mmol/L   Glucose, Bld 103 (H) 70 - 99 mg/dL    Comment: Glucose reference range applies only to samples taken after fasting for at least 8 hours.   BUN 13 8 - 23 mg/dL   Creatinine, Ser 9.39 0.44 - 1.00 mg/dL   Calcium  10.0 8.9 - 10.3 mg/dL   GFR, Estimated >39 >39 mL/min    Comment: (NOTE) Calculated using the CKD-EPI Creatinine Equation (2021)    Anion gap 6 5 - 15    Comment: Performed at [facility], [address] [place], [place] [address]

## 2023-08-18 NOTE — Anesthesia Postprocedure Evaluation (Signed)
 Anesthesia Post Note  Patient: Ann Mendez  Procedure(s) Performed: CARPAL TUNNEL RELEASE (Left)  Patient location during evaluation: PACU Anesthesia Type: General Level of consciousness: awake and alert Pain management: pain level controlled Vital Signs Assessment: post-procedure vital signs reviewed and stable Respiratory status: spontaneous breathing, nonlabored ventilation and respiratory function stable Cardiovascular status: blood pressure returned to baseline and stable Postop Assessment: no apparent nausea or vomiting Anesthetic complications: no   No notable events documented.   Last Vitals:  Vitals:   08/18/23 0900 08/18/23 0911  BP: 134/79 (!) 154/81  Pulse: 77 79  Resp: 14 17  Temp:  (!) 35.8 C  SpO2: 100% 100%    Last Pain:  Vitals:   08/18/23 0911  TempSrc: Temporal  PainSc: 6                  Camellia Merilee Louder

## 2023-08-18 NOTE — Op Note (Signed)
 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  Findings: Severe compression of the median nerve at the transverse carpal ligament  Preoperative Diagnosis:G56.02 left carpal tunnel syndrome  Postoperative Diagnosis: G56.02 left carpal tunnel syndrome   Postoperative Diagnosis: same   EBL: Minimal IVF: See anesthesia report Drains: none Disposition:Stable to PACU Complications: none  No foley catheter was placed.   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  They had tried rest, padding, and watchful waiting but had continued progressive symptoms.  Given the progression of her median neuropathy plan was made for median nerve decompression  Risk of surgery is discussed and include: Infection, bleeding, wound healing issues, pillar pain nerve injury, pain, failure to relieve the symptoms, need for further surgery.  Procedure:  1) left sided carpal tunnel decompression with ultrasound guidance   Procedure: After obtaining informed consent, the patient taken to the operating room, placed in supine position, monitored anesthesia care was induced.  They were given preoperative antibiotics.  Prepped and draped in the usual fashion.  Comprehensive timeout was performed verifying the patient's name, MRN, planned procedure.  An ultrasound was used with a sterile probe.  We used this to mark out our safety points including the interval between the ulnar artery and median nerve.  We identified the motor branch as well as the first sensory branch which were both in safe position.  We also identified the vascular arcade which was in safe positioning as well.  At this point we placed a block with Marcaine without epinephrine.  We blocked the skin where the incision would be, the superficial sensory median nerve, as well as the  transverse carpal ligament.  Under ultrasound guidance we utilized the local anesthetic to perform a hydrodissection of the nerve from the transverse carpal ligament.  We then prepped the sonexs ultra CTR knife on the back table while the anesthetic set in.  We performed a small linear incision approximately 2 to 3 mm.  We then utilized a Statistician under ultrasound guidance to identify the underside of the transverse carpal ligament.  The fat and connective tissue was dissected off the underside.  We could feel that this was quite thickened and calcified.  Causing severe compression.  Once we had a clear tract we then placed the ultrasound-guided knife into the incision and advanced it through the carpal tunnel.  We verified the safety zones including the median nerve which did not significantly cross over the knife.  We are able to see the first sensory branch which was not crossing the knife.  The artery was also in a safe place.  At this point we divided the transverse carpal ligament under ultrasound guidance, to get a full release it took 2 passes.  The nerve relaxed laterally and was well decompressed.  After the 2 passes we placed a [place] 4 into the wound and were able to feel a complete dissection of the transverse carpal ligament.  We then irrigated, we got meticulous hemostasis.  Skin glue was placed on the incision and a Band-Aid was placed on top once this was dried.  No immediate complications.  Sponge and pattie counts were correct at the end of the procedure.   I performed the procedure without an assistant surgeon  Lovenia Kim, MD/MSCR

## 2023-08-18 NOTE — Discharge Instructions (Addendum)
 Your surgeon has performed an operation on the nerves in your upper extremity. Many times, patients feel better immediately after surgery and can "overdo it." Even if you feel well, it is important that you follow these activity guidelines. If you do not let your nerve heal properly from the surgery, you can increase the chance of return of your symptoms. The following are instructions to help in your recovery once you have been discharged from the hospital.  It is normal for your nerves to feel increased "tingling" after surgery or a sensation that the nerve is "waking up".  This generally will resolve in a short period of time, within 1 to 2 weeks.  * It is ok to take NSAIDs after surgery.  Activity    Increase physical activity slowly as tolerated.  Taking short walks is encouraged, but avoid strenuous exercise. Do not jog, run, bicycle, lift weights, or participate in any other exercises unless specifically allowed by your doctor. Avoid prolonged sitting, including car rides.  For the first few days after surgery, avoid any use of the extremity more than a glass of water or for eating.  Specific instructions were given to you based off of your specific procedure.  You should not drive until no longer taking any new pain medication.  You may shower three days after your surgery.  After showering, lightly dab your incision dry. Do not take a tub bath or go swimming for 3 weeks, or until approved by your doctor at your follow-up appointment.  If you smoke, we strongly recommend that you quit.  Smoking has been proven to interfere with normal healing in your back and will dramatically reduce the success rate of your surgery. Please contact [facility] (800-QUIT-NOW) and use the resources at www.[facility].com for assistance in stopping smoking.  Surgical Incision   If you have a dressing on your incision, you may remove it three days after your surgery. Keep your incision area clean and dry.  Your  sutures are under your skin and your wound is covered with surgical glue.  The glue should begin to peel away within about a week.   Diet            You may return to your usual diet. Be sure to stay hydrated.  When to Contact us  Although your surgery and recovery will likely be uneventful, you may have some residual numbness, aches, and pains in your back and/or legs. This is normal and should improve in the next few weeks.  However, should you experience any of the following, contact us immediately: New numbness or weakness Pain that is progressively getting worse, and is not relieved by your pain medications or rest Bleeding, redness, swelling, pain, or drainage from surgical incision Chills or flu-like symptoms Fever greater than 101.0 F (38.3 C) Problems with bowel or bladder functions Difficulty breathing or shortness of breath Warmth, tenderness, or swelling in your calf  Contact Information How to contact us:  If you have any questions/concerns before or after surgery, you can reach [place] at [phone], or you can send a mychart message. We can be reached by phone or mychart 8am-4pm, Monday-Friday.  *Please note: Calls after 4pm are forwarded to a third party answering service. Mychart messages are not routinely monitored during evenings, weekends, and holidays. Please call our office to contact the answering service for urgent concerns during non-business hours.

## 2023-08-18 NOTE — Transfer of Care (Signed)
 Immediate Anesthesia Transfer of Care Note  Patient: Ann Mendez  Procedure(s) Performed: CARPAL TUNNEL RELEASE (Left)  Patient Location: PACU  Anesthesia Type:MAC  Level of Consciousness: drowsy  Airway & Oxygen Therapy: Patient Spontanous Breathing  Post-op Assessment: Report given to RN and Post -op Vital signs reviewed and stable  Post vital signs: Reviewed and stable  Last Vitals:  Vitals Value Taken Time  BP 148/83 08/18/23 08:38  Temp 36.3 C 08/18/23 08:38  Pulse 88 08/18/23 08:42  Resp 22 08/18/23 08:42  SpO2 95 % 08/18/23 08:42  Vitals shown include unfiled device data.  Last Pain:  Vitals:   08/18/23 0654  TempSrc: Temporal  PainSc: 0-No pain         Complications: No notable events documented.

## 2023-08-19 ENCOUNTER — Encounter: Payer: Self-pay | Admitting: Neurosurgery

## 2023-08-20 DIAGNOSIS — F411 Generalized anxiety disorder: Secondary | ICD-10-CM | POA: Diagnosis not present

## 2023-08-31 DIAGNOSIS — E7800 Pure hypercholesterolemia, unspecified: Secondary | ICD-10-CM | POA: Diagnosis not present

## 2023-08-31 DIAGNOSIS — E559 Vitamin D deficiency, unspecified: Secondary | ICD-10-CM | POA: Diagnosis not present

## 2023-09-02 ENCOUNTER — Ambulatory Visit (INDEPENDENT_AMBULATORY_CARE_PROVIDER_SITE_OTHER): Admitting: Physician Assistant

## 2023-09-02 ENCOUNTER — Other Ambulatory Visit (HOSPITAL_COMMUNITY): Payer: Self-pay

## 2023-09-02 ENCOUNTER — Other Ambulatory Visit: Payer: Self-pay

## 2023-09-02 VITALS — BP 144/92 | Temp 98.9°F | Ht 61.25 in | Wt 115.2 lb

## 2023-09-02 DIAGNOSIS — Z09 Encounter for follow-up examination after completed treatment for conditions other than malignant neoplasm: Secondary | ICD-10-CM

## 2023-09-02 DIAGNOSIS — G5602 Carpal tunnel syndrome, left upper limb: Secondary | ICD-10-CM

## 2023-09-02 DIAGNOSIS — F418 Other specified anxiety disorders: Secondary | ICD-10-CM | POA: Diagnosis not present

## 2023-09-02 DIAGNOSIS — Z6821 Body mass index (BMI) 21.0-21.9, adult: Secondary | ICD-10-CM | POA: Diagnosis not present

## 2023-09-02 DIAGNOSIS — E2839 Other primary ovarian failure: Secondary | ICD-10-CM | POA: Diagnosis not present

## 2023-09-02 DIAGNOSIS — M5442 Lumbago with sciatica, left side: Secondary | ICD-10-CM | POA: Diagnosis not present

## 2023-09-02 DIAGNOSIS — G5601 Carpal tunnel syndrome, right upper limb: Secondary | ICD-10-CM

## 2023-09-02 DIAGNOSIS — E7800 Pure hypercholesterolemia, unspecified: Secondary | ICD-10-CM | POA: Diagnosis not present

## 2023-09-02 DIAGNOSIS — Z23 Encounter for immunization: Secondary | ICD-10-CM | POA: Diagnosis not present

## 2023-09-02 DIAGNOSIS — E039 Hypothyroidism, unspecified: Secondary | ICD-10-CM | POA: Diagnosis not present

## 2023-09-02 DIAGNOSIS — Z0000 Encounter for general adult medical examination without abnormal findings: Secondary | ICD-10-CM | POA: Diagnosis not present

## 2023-09-02 DIAGNOSIS — E559 Vitamin D deficiency, unspecified: Secondary | ICD-10-CM | POA: Diagnosis not present

## 2023-09-02 MED ORDER — ROSUVASTATIN CALCIUM 10 MG PO TABS
10.0000 mg | ORAL_TABLET | Freq: Every day | ORAL | 3 refills | Status: DC
  Filled 2023-09-02: qty 90, 90d supply, fill #0

## 2023-09-02 MED ORDER — LEVOTHYROXINE SODIUM 25 MCG PO TABS
25.0000 ug | ORAL_TABLET | Freq: Every morning | ORAL | 1 refills | Status: AC
  Filled 2023-09-02: qty 90, 90d supply, fill #0
  Filled 2023-12-17: qty 90, 90d supply, fill #1

## 2023-09-02 MED ORDER — DICLOFENAC SODIUM 75 MG PO TBEC
75.0000 mg | DELAYED_RELEASE_TABLET | ORAL | 0 refills | Status: AC
  Filled 2023-09-02: qty 45, 90d supply, fill #0

## 2023-09-02 NOTE — Progress Notes (Signed)
   REFERRING PHYSICIAN:  Katina Pfeiffer, Pa-c [address] [place],  [place] [address]  DOS: 08/18/23, Left US  guided CTR  HISTORY OF PRESENT ILLNESS: Ann Mendez is 2 weeks status post L CTR. Overall, she is doing well. Her pain has improved as well as her numbness and tingling.  She continues to have severe pain in her right hand with a planned surgery in approximately 1 month of her right hand.  PHYSICAL EXAMINATION:  NEUROLOGICAL:  General: In no acute distress.   Awake, alert, oriented to person, place, and time.  Pupils equal round and reactive to light.  Facial tone is symmetric.    Strength: Exam is to baseline. Incision c/d/I    Assessment / Plan: Ann Mendez is doing well after status post above surgery.. Her pain has improved as well as her numbness and tingling.  She continues to have severe pain in her right hand with a planned surgery in approximately 1 month of her right hand.We discussed activity escalation and I have advised the patient to lift up to 10 pounds until 6 weeks after surgery, then increase up to 25 pounds until 12 weeks after surgery.  After 12 weeks post-op, the patient advised to increase activity as tolerated.   Due to severe pain in her right hand she is inquiring about a injection in her right carpal tunnel or potentially moving up her surgery date for carpal tunnel release.  I will reach out to the team to explore possibilities.  Advised to contact the office if any questions or concerns arise.   Lyle Decamp PA-C Dept of Neurosurgery

## 2023-09-04 ENCOUNTER — Other Ambulatory Visit (HOSPITAL_COMMUNITY): Payer: Self-pay

## 2023-09-04 ENCOUNTER — Other Ambulatory Visit: Payer: Self-pay | Admitting: Physician Assistant

## 2023-09-04 MED ORDER — GABAPENTIN 100 MG PO CAPS
300.0000 mg | ORAL_CAPSULE | Freq: Three times a day (TID) | ORAL | 0 refills | Status: DC
  Filled 2023-09-04 – 2023-10-31 (×2): qty 180, 20d supply, fill #0

## 2023-09-07 ENCOUNTER — Other Ambulatory Visit: Payer: Self-pay

## 2023-09-07 ENCOUNTER — Encounter: Payer: Self-pay | Admitting: Neurosurgery

## 2023-09-07 ENCOUNTER — Telehealth: Payer: Self-pay

## 2023-09-07 ENCOUNTER — Other Ambulatory Visit (HOSPITAL_COMMUNITY): Payer: Self-pay

## 2023-09-07 MED ORDER — METHYLPHENIDATE HCL ER (OSM) 18 MG PO TBCR
18.0000 mg | EXTENDED_RELEASE_TABLET | Freq: Every day | ORAL | 0 refills | Status: DC
  Filled 2023-09-07: qty 30, 30d supply, fill #0

## 2023-09-07 NOTE — Telephone Encounter (Signed)
 I spoke with the patient. Her surgery has been moved up to 10/01/23.

## 2023-09-07 NOTE — Telephone Encounter (Signed)
 Spoke with the patient. Surgery has been moved up to 10/01/23.

## 2023-09-07 NOTE — Telephone Encounter (Signed)
 Left message to return call to discuss sooner surgery dates.  Per discussion with Dr Claudene, can potentially move up to 9/9, 9/16, 9/18.

## 2023-09-07 NOTE — Telephone Encounter (Signed)
-----   Message from Arena sent at 09/02/2023  2:28 PM EDT ----- Regarding: Potential surgery date change Hey guys,  Patient just had left ultrasound-guided carpal tunnel release 2 weeks ago and has her right hand scheduled in about a month.  Currently she is in a lot of pain.  She was asking about an injection prior to her scheduled surgery date or even moving up her surgery in order to have relief faster fort he contralateral side.  Let me know her thoughts.  Best,  Lyle

## 2023-09-12 IMAGING — MR MR SHOULDER*R* W/O CM
4 of 5 series · 26 of 40 positions shown · non-contrast
Comparison: No pertinent prior exams available for comparison.

CLINICAL DATA: Patient complains of right shoulder pain for 6
months and has experienced weakness as well.

EXAM:
MRI OF THE RIGHT SHOULDER WITHOUT CONTRAST
TECHNIQUE: Multiplanar, multisequence MR imaging of the shoulder was performed.
No intravenous contrast was administered.

[Series 3: T2 fat-sat · axial · 4.0mm · 0.27mm/px · z∈[-38,+48]mm · 8 of 19 slices shown (1 of 3)]
[im 1/19]
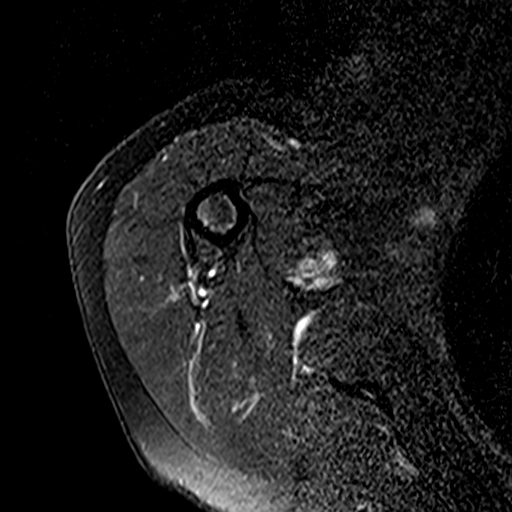
[im 3/19]
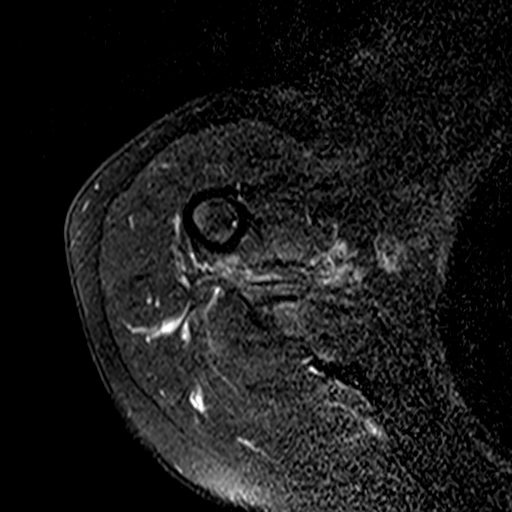
[im 6/19]
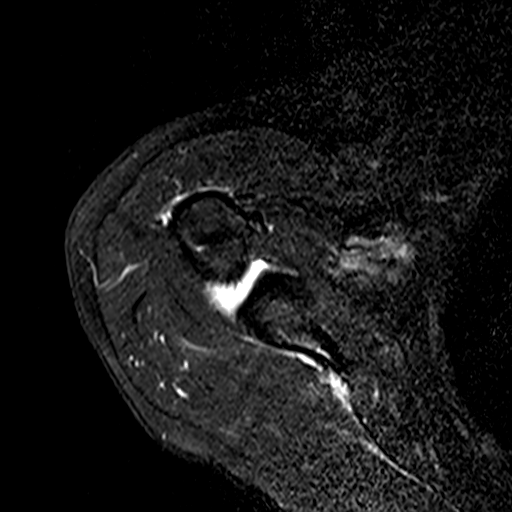
[im 8/19]
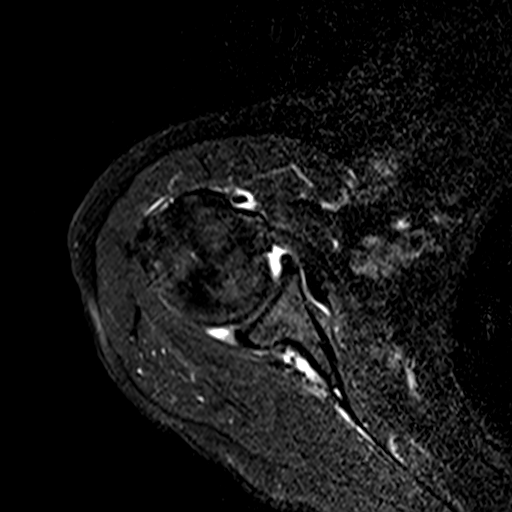
[im 11/19]
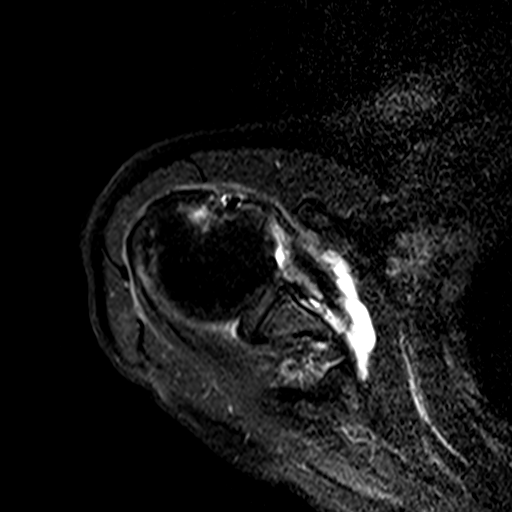
[im 13/19]
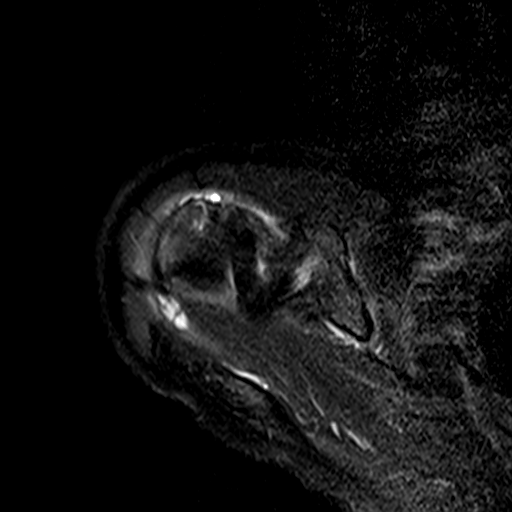
[im 16/19]
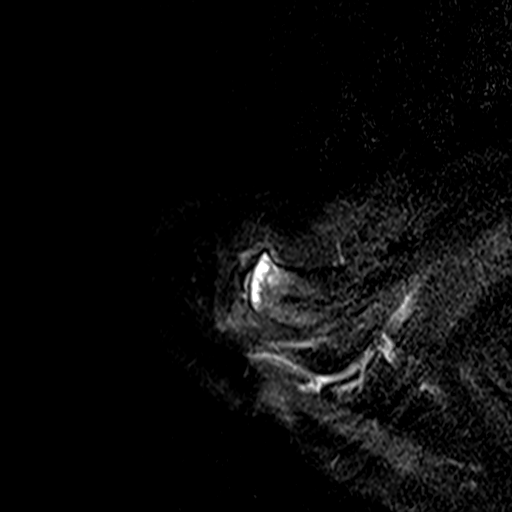
[im 19/19]
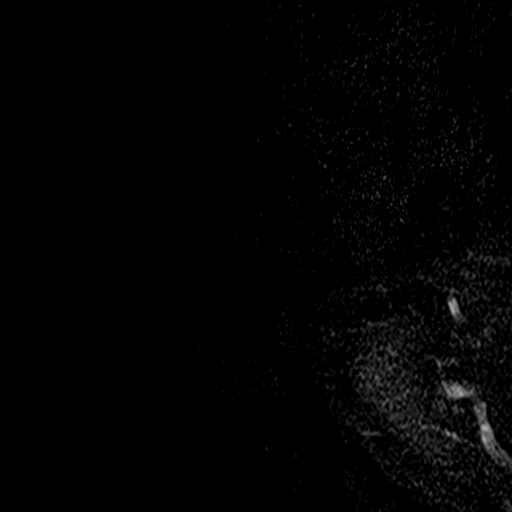

[Series 4: PD · oblique · 4.0mm · 0.27mm/px · 8 of 18 slices shown]
[im 1/18]
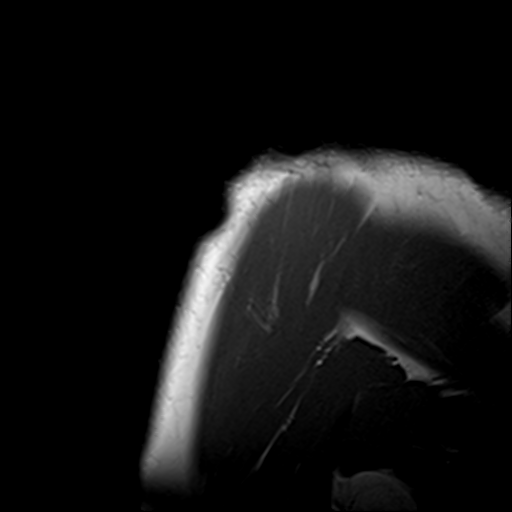
[im 3/18]
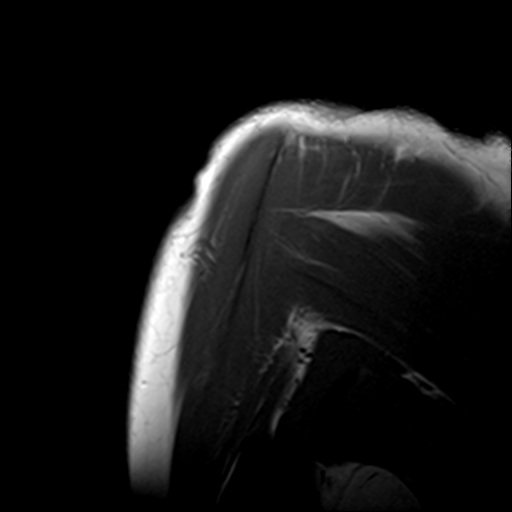
[im 5/18]
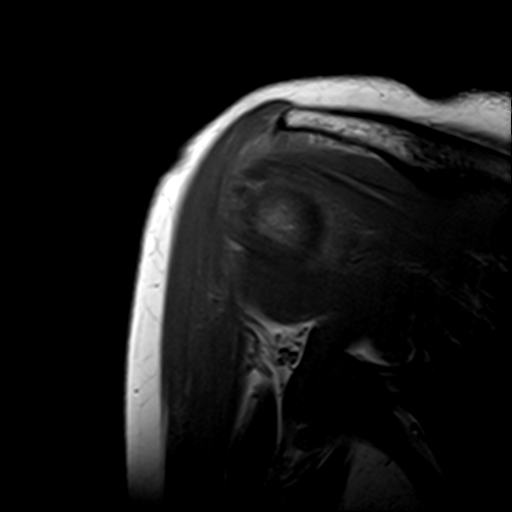
[im 8/18]
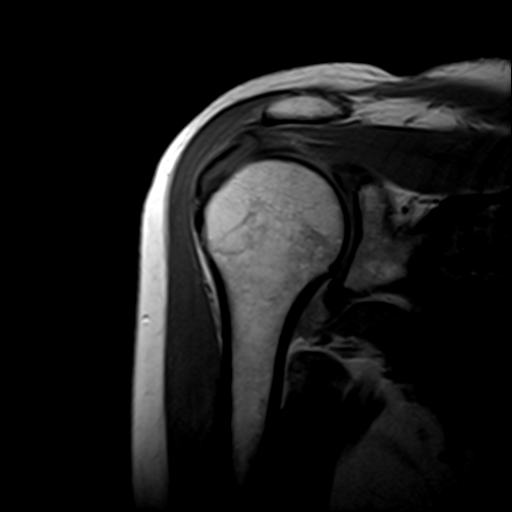
[im 10/18]
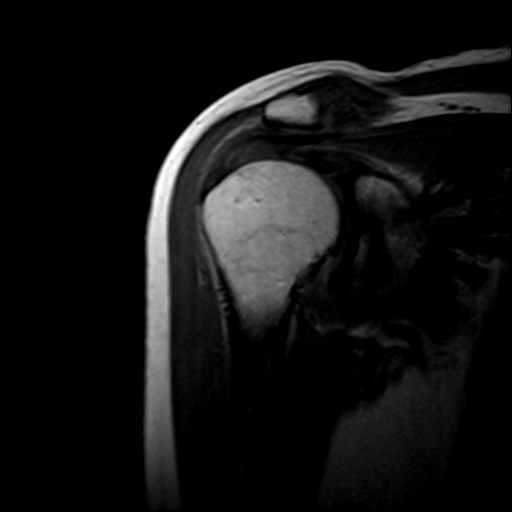
[im 13/18]
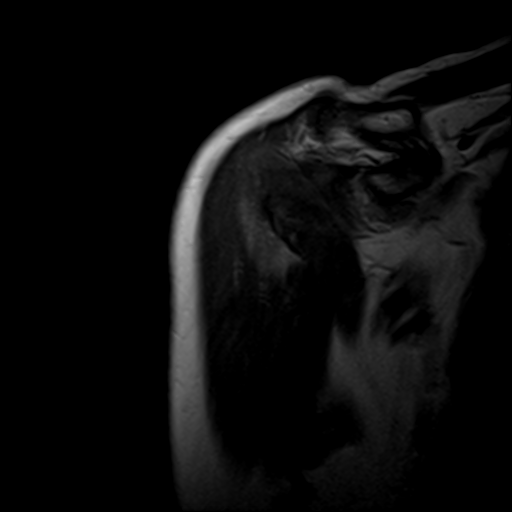
[im 15/18]
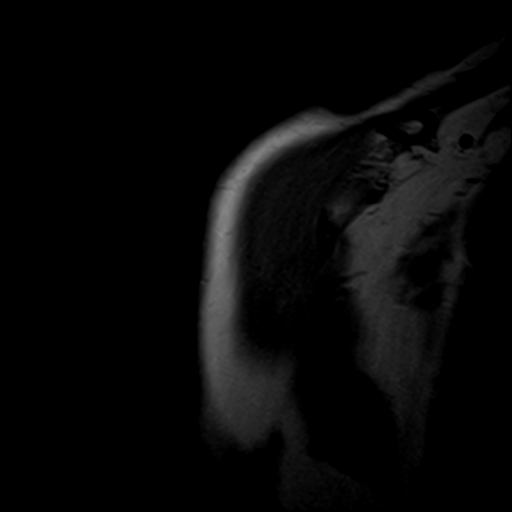
[im 18/18]
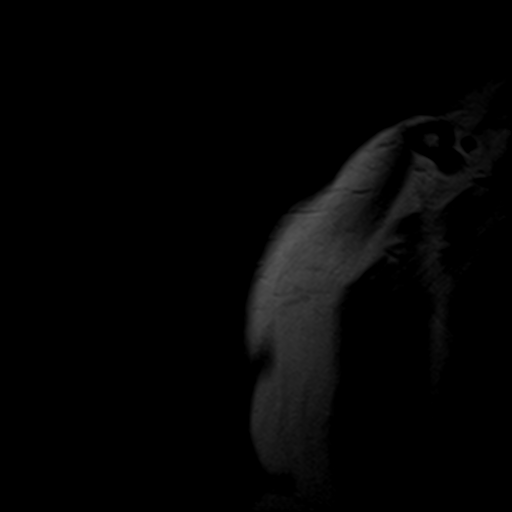

[Series 5: T2 fat-sat · oblique · 4.0mm · 0.55mm/px · 7 of 18 slices shown (2 of 3)]
[im 1/18]
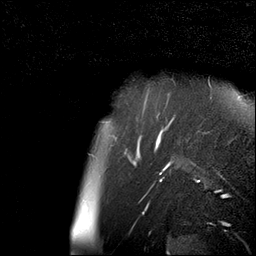
[im 3/18]
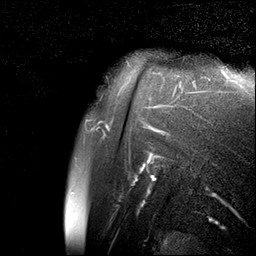
[im 5/18]
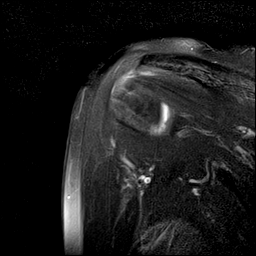
[im 8/18]
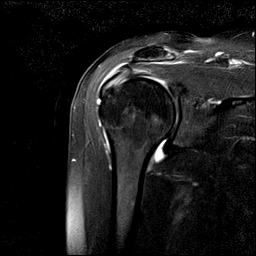
[im 10/18]
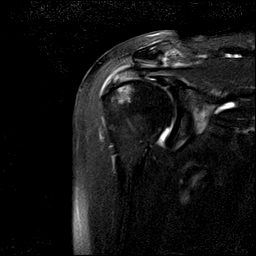
[im 13/18]
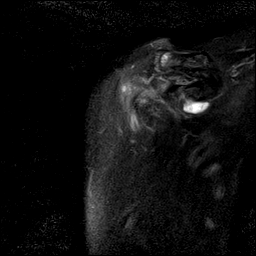
[im 15/18]
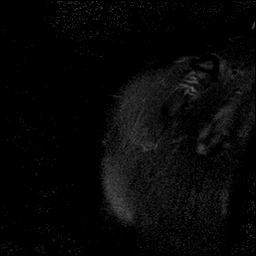

[Series 6: T2 fat-sat · oblique · 4.0mm · 0.55mm/px · 3 of 19 slices shown (3 of 3)]
[im 3/19]
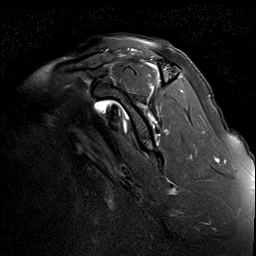
[im 11/19]
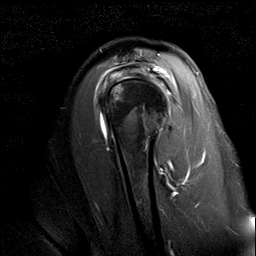
[im 16/19]
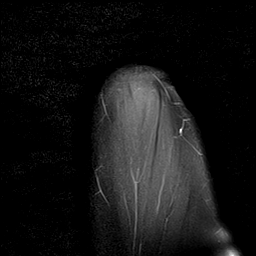

[26 of 40 positions shown; findings below may reference images not displayed]

FINDINGS: Rotator cuff: Severe tendinosis of the supraspinatus tendon with a
partial-thickness bursal surface tear anteriorly. Severe tendinosis
of the infraspinatus tendon with a small insertional interstitial
tear. Teres minor tendon is intact. Subscapularis tendon is intact.

Muscles: No muscle atrophy. Small ganglion cyst in the infraspinatus
muscle at the musculotendinous junction.

Biceps long head: Mild tendinosis of the intra-articular portion of
the long head of the biceps tendon.

Acromioclavicular Joint: Moderate arthropathy of the
acromioclavicular joint. Type II acromion. Trace
subacromial/subdeltoid bursal fluid.

Glenohumeral Joint: No joint effusion.  No chondral defect.

Labrum: Grossly intact, but evaluation is limited by lack of
intraarticular fluid.

Bones:  No acute osseous abnormality.  No aggressive osseous lesion.

Other: No fluid collection or hematoma.
IMPRESSION: 1. Severe tendinosis of the supraspinatus tendon with a
partial-thickness bursal surface tear anteriorly.
2. Severe tendinosis of the infraspinatus tendon with a small
insertional interstitial tear.
3. Mild tendinosis of the intra-articular portion of the long head
of the biceps tendon.

## 2023-09-24 DIAGNOSIS — F411 Generalized anxiety disorder: Secondary | ICD-10-CM | POA: Diagnosis not present

## 2023-09-28 ENCOUNTER — Other Ambulatory Visit

## 2023-09-30 ENCOUNTER — Encounter
Admission: RE | Admit: 2023-09-30 | Discharge: 2023-09-30 | Disposition: A | Discharge status: Home / Self Care | Source: Ambulatory Visit | Attending: Neurosurgery | Admitting: Neurosurgery

## 2023-09-30 DIAGNOSIS — R1032 Left lower quadrant pain: Secondary | ICD-10-CM

## 2023-09-30 DIAGNOSIS — M21619 Bunion of unspecified foot: Secondary | ICD-10-CM

## 2023-09-30 DIAGNOSIS — F411 Generalized anxiety disorder: Secondary | ICD-10-CM | POA: Diagnosis not present

## 2023-09-30 HISTORY — DX: Bipolar disorder, unspecified: F31.9

## 2023-09-30 HISTORY — DX: Carpal tunnel syndrome, right upper limb: G56.01

## 2023-09-30 HISTORY — DX: Personal history of other diseases of the nervous system and sense organs: Z86.69

## 2023-09-30 HISTORY — DX: Personal history of colon polyps, unspecified: Z86.0100

## 2023-09-30 NOTE — Patient Instructions (Signed)
 Your procedure is scheduled on:10-01-23 Thursday Report to the Registration Desk on the 1st floor of the [facility].Then proceed to the 2nd floor Surgery Desk To find out your arrival time, please call [phone] between 1PM - 3PM on:09-30-23 Wednesday If your arrival time is 6:00 am, do not arrive before that time as the [facility] entrance doors do not open until 6:00 am.  REMEMBER: Instructions that are not followed completely may result in serious medical risk, up to and including death; or upon the discretion of your surgeon and anesthesiologist your surgery may need to be rescheduled.  Do not eat food after midnight the night before surgery.  No gum chewing or hard candies.  You may however, drink CLEAR liquids up to 2 hours before you are scheduled to arrive for your surgery. Do not drink anything within 2 hours of your scheduled arrival time.  Clear liquids include: - water  - apple juice without pulp - gatorade (not RED colors) - black coffee or tea (Do NOT add milk or creamers to the coffee or tea) Do NOT drink anything that is not on this list.  One week prior to surgery:Stop NOW (09-30-23) Stop ANY OVER THE COUNTER supplements until after surgery (Vitamin D , Hair, Skin and Nails)  Continue taking all of your other prescription medications up until the day of surgery.  ON THE DAY OF SURGERY ONLY TAKE THESE MEDICATIONS WITH SIPS OF WATER: -buPROPion  (WELLBUTRIN  XL)  -levothyroxine  (SYNTHROID )  -sertraline  (ZOLOFT )   No Alcohol for 24 hours before or after surgery.  No Smoking including e-cigarettes for 24 hours before surgery.  No chewable tobacco products for at least 6 hours before surgery.  No nicotine patches on the day of surgery.  Do not use any recreational drugs for at least a week (preferably 2 weeks) before your surgery.  Please be advised that the combination of cocaine and anesthesia may have negative outcomes, up to and including death. If you test  positive for cocaine, your surgery will be cancelled.  On the morning of surgery brush your teeth with toothpaste and water, you may rinse your mouth with mouthwash if you wish. Do not swallow any toothpaste or mouthwash.  Use CHG Soap as directed on instruction sheet.  Do not wear jewelry, make-up, hairpins, clips or nail polish.  For welded (permanent) jewelry: bracelets, anklets, waist bands, etc.  Please have this removed prior to surgery.  If it is not removed, there is a chance that hospital personnel will need to cut it off on the day of surgery.  Do not wear lotions, powders, or perfumes.   Do not shave body hair from the neck down 48 hours before surgery.  Contact lenses, hearing aids and dentures may not be worn into surgery.  Do not bring valuables to the hospital. [facility] is not responsible for any missing/lost belongings or valuables.   Notify your doctor if there is any change in your medical condition (cold, fever, infection).  Wear comfortable clothing (specific to your surgery type) to the hospital.  After surgery, you can help prevent lung complications by doing breathing exercises.  Take deep breaths and cough every 1-2 hours. Your doctor may order a device called an Incentive Spirometer to help you take deep breaths. When coughing or sneezing, hold a pillow firmly against your incision with both hands. This is called "splinting." Doing this helps protect your incision. It also decreases belly discomfort.  If you are being admitted to the hospital  overnight, leave your suitcase in the car. After surgery it may be brought to your room.  In case of increased patient census, it may be necessary for you, the patient, to continue your postoperative care in the Same Day Surgery department.  If you are being discharged the day of surgery, you will not be allowed to drive home. You will need a responsible individual to drive you home and stay with you for 24 hours after  surgery.   If you are taking public transportation, you will need to have a responsible individual with you.  Please call the Pre-admissions Testing Dept. at [phone] if you have any questions about these instructions.  Surgery Visitation Policy:  Patients having surgery or a procedure may have two visitors.  Children under the age of 71 must have an adult with them who is not the patient.                                                                                                             Preparing for Surgery with CHLORHEXIDINE  GLUCONATE (CHG) Soap  Chlorhexidine  Gluconate (CHG) Soap  o An antiseptic cleaner that kills germs and bonds with the skin to continue killing germs even after washing  o Used for showering the night before surgery and morning of surgery  Before surgery, you can play an important role by reducing the number of germs on your skin.  CHG (Chlorhexidine  gluconate) soap is an antiseptic cleanser which kills germs and bonds with the skin to continue killing germs even after washing.  Please do not use if you have an allergy to CHG or antibacterial soaps. If your skin becomes reddened/irritated stop using the CHG.  1. Shower the NIGHT BEFORE SURGERY and the MORNING OF SURGERY with CHG soap.  2. If you choose to wash your hair, wash your hair first as usual with your normal shampoo.  3. After shampooing, rinse your hair and body thoroughly to remove the shampoo.  4. Use CHG as you would any other liquid soap. You can apply CHG directly to the skin and wash gently with a scrungie or a clean washcloth.  5. Apply the CHG soap to your body only from the neck down. Do not use on open wounds or open sores. Avoid contact with your eyes, ears, mouth, and genitals (private parts). Wash face and genitals (private parts) with your normal soap.  6. Wash thoroughly, paying special attention to the area where your surgery will be performed.  7. Thoroughly rinse  your body with warm water.  8. Do not shower/wash with your normal soap after using and rinsing off the CHG soap.  9. Pat yourself dry with a clean towel.  10. Wear clean pajamas to bed the night before surgery.  12. Place clean sheets on your bed the night of your first shower and do not sleep with pets.  13. Shower again with the CHG soap on the day of surgery prior to arriving at the hospital.  14. Do not apply any deodorants/lotions/powders.  15. Please wear clean clothes to the hospital.   [employer] to address health-related social needs:  [email or web address]

## 2023-10-01 ENCOUNTER — Encounter: Payer: Self-pay | Admitting: Neurosurgery

## 2023-10-01 ENCOUNTER — Other Ambulatory Visit: Payer: Self-pay

## 2023-10-01 ENCOUNTER — Ambulatory Visit
Admission: RE | Admit: 2023-10-01 | Discharge: 2023-10-01 | Disposition: A | Discharge status: Home / Self Care | Attending: Neurosurgery | Admitting: Neurosurgery

## 2023-10-01 ENCOUNTER — Ambulatory Visit

## 2023-10-01 ENCOUNTER — Encounter
Admission: RE | Disposition: A | Discharge status: Home / Self Care | Payer: Self-pay | Source: Home / Self Care | Attending: Neurosurgery

## 2023-10-01 DIAGNOSIS — Z01818 Encounter for other preprocedural examination: Secondary | ICD-10-CM

## 2023-10-01 DIAGNOSIS — G5601 Carpal tunnel syndrome, right upper limb: Secondary | ICD-10-CM | POA: Diagnosis not present

## 2023-10-01 DIAGNOSIS — E785 Hyperlipidemia, unspecified: Secondary | ICD-10-CM | POA: Diagnosis not present

## 2023-10-01 HISTORY — PX: CARPAL TUNNEL RELEASE: SHX101

## 2023-10-01 SURGERY — CARPAL TUNNEL RELEASE
Anesthesia: Monitor Anesthesia Care | Site: Arm Lower | Laterality: Right

## 2023-10-01 MED ORDER — HYDROCODONE-ACETAMINOPHEN 5-325 MG PO TABS
1.0000 | ORAL_TABLET | Freq: Four times a day (QID) | ORAL | 0 refills | Status: AC | PRN
  Filled 2023-10-01: qty 20, 5d supply, fill #0

## 2023-10-01 MED ORDER — LIDOCAINE HCL (PF) 2 % IJ SOLN
INTRAMUSCULAR | Status: AC
Start: 2023-10-01 — End: 2023-10-01
  Filled 2023-10-01: qty 5

## 2023-10-01 MED ORDER — PROPOFOL 500 MG/50ML IV EMUL
INTRAVENOUS | Status: DC | PRN
  Administered 2023-10-01: 30 mg via INTRAVENOUS
  Administered 2023-10-01: 10 mg via INTRAVENOUS
  Administered 2023-10-01: 20 mg via INTRAVENOUS

## 2023-10-01 MED ORDER — PROPOFOL 10 MG/ML IV BOLUS
INTRAVENOUS | Status: AC
  Filled 2023-10-01: qty 20

## 2023-10-01 MED ORDER — ACETAMINOPHEN 10 MG/ML IV SOLN
INTRAVENOUS | Status: DC | PRN
Start: 2023-10-01 — End: 2023-10-01
  Administered 2023-10-01: 1000 mg via INTRAVENOUS

## 2023-10-01 MED ORDER — CHLORHEXIDINE GLUCONATE 0.12 % MT SOLN
15.0000 mL | Freq: Once | OROMUCOSAL | Status: AC
  Administered 2023-10-01: 15 mL via OROMUCOSAL

## 2023-10-01 MED ORDER — DEXAMETHASONE SODIUM PHOSPHATE 10 MG/ML IJ SOLN
INTRAMUSCULAR | Status: AC
  Filled 2023-10-01: qty 1

## 2023-10-01 MED ORDER — KETOROLAC TROMETHAMINE 30 MG/ML IJ SOLN
INTRAMUSCULAR | Status: DC | PRN
  Administered 2023-10-01: 30 mg via INTRAVENOUS

## 2023-10-01 MED ORDER — DOCUSATE SODIUM 100 MG PO CAPS
100.0000 mg | ORAL_CAPSULE | Freq: Two times a day (BID) | ORAL | 0 refills | Status: AC
Start: 2023-10-01 — End: 2023-10-08
  Filled 2023-10-01: qty 14, 7d supply, fill #0

## 2023-10-01 MED ORDER — DEXAMETHASONE SODIUM PHOSPHATE 4 MG/ML IJ SOLN
INTRAMUSCULAR | Status: DC | PRN
  Administered 2023-10-01: 5 mg via INTRAVENOUS

## 2023-10-01 MED ORDER — LIDOCAINE HCL 1 % IJ SOLN
INTRAMUSCULAR | Status: DC | PRN
  Administered 2023-10-01: 3 mL

## 2023-10-01 MED ORDER — ONDANSETRON HCL 4 MG/2ML IJ SOLN
INTRAMUSCULAR | Status: AC
  Filled 2023-10-01: qty 2

## 2023-10-01 MED ORDER — KETOROLAC TROMETHAMINE 30 MG/ML IJ SOLN
INTRAMUSCULAR | Status: AC
  Filled 2023-10-01: qty 1

## 2023-10-01 MED ORDER — LIDOCAINE HCL (PF) 1 % IJ SOLN
INTRAMUSCULAR | Status: AC
  Filled 2023-10-01: qty 30

## 2023-10-01 MED ORDER — LACTATED RINGERS IV SOLN: INTRAVENOUS | Status: DC

## 2023-10-01 MED ORDER — CHLORHEXIDINE GLUCONATE 0.12 % MT SOLN
OROMUCOSAL | Status: AC
Start: 2023-10-01 — End: 2023-10-01
  Filled 2023-10-01: qty 15

## 2023-10-01 MED ORDER — SENNA 8.6 MG PO TABS
1.0000 | ORAL_TABLET | Freq: Every day | ORAL | 0 refills | Status: AC
Start: 2023-10-01 — End: 2023-10-08
  Filled 2023-10-01: qty 7, 7d supply, fill #0

## 2023-10-01 MED ORDER — MIDAZOLAM HCL 2 MG/2ML IJ SOLN
INTRAMUSCULAR | Status: DC | PRN
  Administered 2023-10-01: 2 mg via INTRAVENOUS

## 2023-10-01 MED ORDER — MIDAZOLAM HCL 2 MG/2ML IJ SOLN
INTRAMUSCULAR | Status: AC
  Filled 2023-10-01: qty 2

## 2023-10-01 MED ORDER — OXYCODONE HCL 5 MG PO TABS: 5.0000 mg | ORAL_TABLET | Freq: Once | ORAL | Status: DC | PRN

## 2023-10-01 MED ORDER — ORAL CARE MOUTH RINSE: 15.0000 mL | Freq: Once | OROMUCOSAL | Status: AC

## 2023-10-01 MED ORDER — 0.9 % SODIUM CHLORIDE (POUR BTL) OPTIME
TOPICAL | Status: DC | PRN
  Administered 2023-10-01: 500 mL

## 2023-10-01 MED ORDER — FENTANYL CITRATE (PF) 100 MCG/2ML IJ SOLN
INTRAMUSCULAR | Status: AC
  Filled 2023-10-01: qty 2

## 2023-10-01 MED ORDER — FENTANYL CITRATE (PF) 100 MCG/2ML IJ SOLN
INTRAMUSCULAR | Status: DC | PRN
  Administered 2023-10-01: 25 ug via INTRAVENOUS
  Administered 2023-10-01: 50 ug via INTRAVENOUS
  Administered 2023-10-01: 25 ug via INTRAVENOUS

## 2023-10-01 MED ORDER — FENTANYL CITRATE (PF) 100 MCG/2ML IJ SOLN: 25.0000 ug | INTRAMUSCULAR | Status: DC | PRN

## 2023-10-01 MED ORDER — CEFAZOLIN SODIUM-DEXTROSE 2-4 GM/100ML-% IV SOLN
INTRAVENOUS | Status: AC
  Filled 2023-10-01: qty 100

## 2023-10-01 MED ORDER — ACETAMINOPHEN 10 MG/ML IV SOLN
INTRAVENOUS | Status: AC
  Filled 2023-10-01: qty 100

## 2023-10-01 MED ORDER — LIDOCAINE HCL (CARDIAC) PF 100 MG/5ML IV SOSY
PREFILLED_SYRINGE | INTRAVENOUS | Status: DC | PRN
  Administered 2023-10-01: 50 mg via INTRAVENOUS

## 2023-10-01 MED ORDER — ONDANSETRON HCL 4 MG/2ML IJ SOLN
INTRAMUSCULAR | Status: DC | PRN
  Administered 2023-10-01: 4 mg via INTRAVENOUS

## 2023-10-01 MED ORDER — CEFAZOLIN SODIUM-DEXTROSE 2-4 GM/100ML-% IV SOLN
2.0000 g | Freq: Once | INTRAVENOUS | Status: AC
  Administered 2023-10-01: 2 g via INTRAVENOUS

## 2023-10-01 MED ORDER — OXYCODONE HCL 5 MG/5ML PO SOLN: 5.0000 mg | Freq: Once | ORAL | Status: DC | PRN

## 2023-10-01 SURGICAL SUPPLY — 20 items
BNDG ADH 1X3 SHEER STRL LF (GAUZE/BANDAGES/DRESSINGS) ×1 IMPLANT
BNDG ADH 2 X3.75 FABRIC TAN LF (GAUZE/BANDAGES/DRESSINGS) IMPLANT
BRUSH SCRUB EZ 4% CHG (MISCELLANEOUS) ×1 IMPLANT
CHLORAPREP W/TINT 26 (MISCELLANEOUS) ×1 IMPLANT
COVER PROBE FLX POLY STRL (MISCELLANEOUS) ×1 IMPLANT
DERMABOND ADVANCED .7 DNX12 (GAUZE/BANDAGES/DRESSINGS) ×1 IMPLANT
DRAPE EXTREMITY 106X87X128.5 (DRAPES) ×1 IMPLANT
DRAPE IMP U-DRAPE 54X76 (DRAPES) ×1 IMPLANT
DRAPE SHEET LG 3/4 BI-LAMINATE (DRAPES) ×1 IMPLANT
GLOVE BIOGEL PI IND STRL 8 (GLOVE) ×1 IMPLANT
GLOVE SRG 8 PF TXTR STRL LF DI (GLOVE) ×1 IMPLANT
GLOVE SURG SYN 7.5 PF PI (GLOVE) ×1 IMPLANT
GOWN SRG XL LVL 3 NONREINFORCE (GOWNS) ×1 IMPLANT
KIT TURNOVER KIT A (KITS) ×1 IMPLANT
NDL HYPO 25X1 1.5 SAFETY (NEEDLE) ×1 IMPLANT
NEEDLE HYPO 25X1 1.5 SAFETY (NEEDLE) ×1 IMPLANT
NS IRRIG 500ML POUR BTL (IV SOLUTION) ×1 IMPLANT
PACK BASIN MINOR ARMC (MISCELLANEOUS) ×1 IMPLANT
STOCKINETTE IMPERVIOUS 9X36 MD (GAUZE/BANDAGES/DRESSINGS) ×1 IMPLANT
ULTRAGLIDE CTR (BLADE) ×1 IMPLANT

## 2023-10-01 NOTE — H&P (View-Only) (Signed)
 Referring Physician:  No referring provider defined for this encounter.  Primary Physician:  Katina Pfeiffer, PA-C  History of Present Illness: 10/01/23  Ann Mendez is here today with a chief complaint of bilateral carpal tunnel syndrome.  She had a prior left-sided carpal tunnel release and has had a good result so far.  Overall she is very pleased.  She is here today for her right sided symptoms.  These continue to wake her at nighttime.  Is in her fearing with her quality of life.  She has had a recent EMG which demonstrates right-sided carpal tunnel syndrome.  Here today to perform her procedure.  . Conservative measures:  Physical therapy: Yes Occupational Therapy: No Hand Therapy: No Injections: Yes Gabapentin : Yes, Lyrica: No, Cymbalta: No Past Surgery: No  Review of Systems:  A 10 point review of systems is negative, except for the pertinent positives and negatives detailed in the HPI.  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   Complex tear of medial meniscus of right knee 01/01/2017   Depression    Headache    orgasm HA   History of Bell's palsy    History of colon polyps    Hyperlipidemia    Hypothyroidism    Lateral meniscus tear 12/2016   right medial   Right carpal tunnel syndrome    Thyroid  disease     Past Surgical History: Past Surgical History:  Procedure Laterality Date   CARPAL TUNNEL RELEASE Left 08/18/2023   Procedure: CARPAL TUNNEL RELEASE;  Surgeon: Claudene Penne ORN, MD;  Location: [facility];  Service: Neurosurgery;  Laterality: Left;  LEFT CARPAL TUNNEL RELEASE WITH ULTRASOUND GUIDANCE   CATARACT EXTRACTION W/ INTRAOCULAR LENS IMPLANT Right 03/2015   CESAREAN SECTION  09/23/2000   COLONOSCOPY     x  3   KNEE ARTHROSCOPY Right 01/01/2017   Procedure: RIGHT KNEE ARTHROSCOPY, CHONDROPLASTY AND PARTIAL MEDIAL LATERAL MENISCECTOMY;  Surgeon: Josefina Chew, MD;  Location: [facility];  Service: Orthopedics;  Laterality: Right;   SKIN CANCER EXCISION     x 2   TUBAL LIGATION  09/23/2000    Allergies: Allergies as of 08/05/2023 - Review Complete 08/05/2023  Allergen Reaction Noted   Stadol [butorphanol] Other (See Comments) 01/23/2021   Sulfa antibiotics Other (See Comments) 12/29/2016    Medications:  Current Facility-Administered Medications:    ceFAZolin  (ANCEF ) IVPB 2g/100 mL premix, 2 g, Intravenous, Once, Claudene, Penne ORN, MD   lactated ringers  infusion, , Intravenous, Continuous, Ann Ann CROME, MD  Social History: Social History   Tobacco Use   Smoking status: Never   Smokeless tobacco: Never  Vaping Use   Vaping status: Never Used  Substance Use Topics   Alcohol use: Not Currently    Comment: 1 beer on the weekend   Drug use: No    Family Medical History: Family History  Problem Relation Age of Onset   Arthritis Mother    Bipolar disorder Mother    Anxiety disorder Mother    Depression Mother    Heart failure Mother    Cancer Mother        Colon,Lung,Kidney   Colon cancer Mother 61  Heart disease Father    Hypertension Father    Cancer Father        Skin   Depression Sister    Colon polyps Brother    Heart disease Brother    Hypertension Brother    Cancer Brother        Skin   Alcohol abuse Brother    Bipolar disorder Maternal Grandmother    Alcohol abuse Maternal Grandmother    Depression Maternal Grandmother    Diabetes Maternal Grandfather    Esophageal cancer Neg Hx    Rectal cancer Neg Hx    Stomach cancer Neg Hx    Inflammatory bowel disease Neg Hx    Liver disease Neg Hx    Pancreatic cancer Neg Hx     Physical Examination: Vitals:   10/01/23 0627  BP: 131/65  Pulse: 71  Resp: 17  Temp: (!) 97.2 F (36.2 C)   SpO2: 99%    General: Patient is in no apparent distress. Attention to examination is appropriate.  Neck:   Supple.  Full range of motion.  Respiratory: Patient is breathing without any difficulty.   NEUROLOGICAL:     Awake, alert, oriented to person, place, and time.  Speech is clear and fluent.   Cranial Nerves: Pupils equal round and reactive to light.  Facial tone is symmetric. Shoulder shrug is symmetric. Tongue protrusion is midline.  There is no pronator drift.  Motor Exam:  She does show some mild thenar wasting bilaterally.  Left is worse than right.  Has a positive Phalen test, positive carpal compression test bilaterally.  Decreased sensation in the bilateral upper extremities in the median nerve distribution.  Gait is normal.    Medical Decision Making  Electrodiagnostics: EMG & NCV Findings: Evaluation of the left median motor and the right median motor nerves showed prolonged distal onset latency (L6.9, R5.2 ms) and decreased conduction velocity (Elbow-Wrist, L48, R49 m/s).  The left median (across palm) sensory nerve showed no response (Wrist) and no response (Palm).  The right median (across palm) sensory nerve showed prolonged distal peak latency (Wrist, 5.2 ms) and prolonged distal peak latency (Palm, 2.3 ms).  All remaining nerves (as indicated in the following tables) were within normal limits.  Left vs. Right side comparison data for the median motor nerve indicates abnormal L-R latency difference (1.7 ms).  The ulnar motor nerve indicates abnormal L-R velocity difference (A Elbow-B Elbow, 19 m/s).  All remaining left vs. right side differences were within normal limits.     All examined muscles (as indicated in the following table) showed no evidence of electrical instability.     Impression: The above electrodiagnostic study is ABNORMAL and reveals evidence of: A severe left median nerve entrapment at the wrist (carpal tunnel syndrome) affecting sensory and  motor components.  A moderate right median nerve entrapment at the wrist (carpal tunnel syndrome) affecting sensory and motor components.    There is no significant electrodiagnostic evidence of any other focal nerve entrapment, brachial plexopathy or cervical radiculopathy.  As you know, this particular electrodiagnostic study cannot rule out chemical radiculitis or sensory only radiculopathy.    **This electrodiagnostic study cannot rule out small fiber polyneuropathy and dysesthesias from central pain syndromes such as stroke or central pain sensitization syndromes such as fibromyalgia.  Myotomal referral pain from trigger points is also not excluded.   Recommendations: 1.  Follow-up with referring physician. 2.  Continue current management of symptoms. 3.  Suggest surgical evaluation.  ___________________________ Prentice Eldonna BETTERS Board Certified, American Board of Physical Medicine and Rehabilitation    I have personally reviewed the images and electrodiagnostics and agree with the above interpretation.  Assessment and Plan: Ann Mendez is a pleasant 63 y.o. female with history of progressive carpal tunnel syndrome.  She has EMG diagnostics which demonstrate bilateral carpal tunnel syndrome left worse than right.  Symptomatically she is worse on the left than she is on the right.  She underwent a left-sided decompression previously and has a very good outcome.  She is looking forward to a right sided decompression today.  Will plan to take her back to the OR for ultrasound-guided carpal tunnel release.  Risk and benefits were discussed.  She wants to go forward with surgery.     Penne MICAEL Sharps MD/MSCR Neurosurgery - Peripheral Nerve Surgery

## 2023-10-01 NOTE — Anesthesia Postprocedure Evaluation (Signed)
 Anesthesia Post Note  Patient: Ann Mendez  Procedure(s) Performed: CARPAL TUNNEL RELEASE (Right: Arm Lower)  Patient location during evaluation: PACU Anesthesia Type: General Level of consciousness: awake and alert Pain management: pain level controlled Vital Signs Assessment: post-procedure vital signs reviewed and stable Respiratory status: spontaneous breathing, nonlabored ventilation, respiratory function stable and patient connected to nasal cannula oxygen Cardiovascular status: blood pressure returned to baseline and stable Postop Assessment: no apparent nausea or vomiting Anesthetic complications: no   No notable events documented.   Last Vitals:  Vitals:   10/01/23 0747 10/01/23 0800  BP: 119/70 124/76  Pulse: 73 70  Resp: (!) 8 10  Temp:    SpO2: 93% 99%    Last Pain:  Vitals:   10/01/23 0747  PainSc: 0-No pain                 Debby Mines

## 2023-10-01 NOTE — Anesthesia Preprocedure Evaluation (Signed)
 Anesthesia Evaluation  Patient identified by MRN, date of birth, ID band Patient awake    Reviewed: Allergy & Precautions, H&P , NPO status , Patient's Chart, lab work & pertinent test results  Airway Mallampati: III  TM Distance: >3 FB Neck ROM: full    Dental no notable dental hx.    Pulmonary neg pulmonary ROS   Pulmonary exam normal        Cardiovascular negative cardio ROS Normal cardiovascular exam     Neuro/Psych  PSYCHIATRIC DISORDERS Anxiety Depression       GI/Hepatic negative GI ROS, Neg liver ROS,,,  Endo/Other  Hypothyroidism    Renal/GU      Musculoskeletal   Abdominal   Peds  Hematology negative hematology ROS (+)   Anesthesia Other Findings Past Medical History: No date: Allergy No date: Anxiety No date: Arthritis     Comment:  left great toe No date: [facility])     Comment:  skin No date: Carpal tunnel syndrome     Comment:  bilateral No date: 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, right 01/01/2017: Complex tear of medial meniscus of right knee No date: Depression No date: Headache     Comment:  orgasm HA No date: Hyperlipidemia No date: Hypothyroidism 12/2016: Lateral meniscus tear     Comment:  right medial No date: Thyroid  disease  Past Surgical History: 03/2015: CATARACT EXTRACTION W/ INTRAOCULAR LENS IMPLANT; Right 09/23/2000: CESAREAN SECTION No date: COLONOSCOPY     Comment:  x 3 01/01/2017: KNEE ARTHROSCOPY; Right     Comment:  Procedure: RIGHT KNEE ARTHROSCOPY, CHONDROPLASTY AND               PARTIAL MEDIAL LATERAL MENISCECTOMY;  Surgeon: Josefina Chew, MD;  Location: [facility];                Service: Orthopedics;  Laterality: Right; No date: SKIN CANCER EXCISION     Comment:  x 2 09/23/2000: TUBAL LIGATION     Reproductive/Obstetrics negative OB ROS                               Anesthesia Physical Anesthesia Plan  ASA: 2  Anesthesia Plan: General   Post-op Pain Management:    Induction: Intravenous  PONV Risk Score and Plan: 3 and Ondansetron , Dexamethasone , Propofol  infusion, TIVA and Midazolam   Airway Management Planned: Natural Airway and Nasal Cannula  Additional Equipment:   Intra-op Plan:   Post-operative Plan:   Informed Consent: I have reviewed the patients History and Physical, chart, labs and discussed the procedure including the risks, benefits and alternatives for the proposed anesthesia with the patient or authorized representative who has indicated his/her understanding and acceptance.     Dental Advisory Given  Plan Discussed with: Anesthesiologist, CRNA and Surgeon  Anesthesia Plan Comments: (Patient consented for risks of anesthesia including but not limited to:  - adverse reactions to medications - risk of airway placement if required - damage to eyes, teeth, lips or other oral mucosa - nerve damage due to positioning  - sore throat or hoarseness - Damage to heart, brain, nerves, lungs, other parts of body or loss of life  Patient voiced understanding and assent.)        Anesthesia Quick Evaluation

## 2023-10-01 NOTE — Transfer of Care (Signed)
 Immediate Anesthesia Transfer of Care Note  Patient: Ann Mendez  Procedure(s) Performed: CARPAL TUNNEL RELEASE (Right: Arm Lower)  Patient Location: PACU  Anesthesia Type:MAC  Level of Consciousness: drowsy and patient cooperative  Airway & Oxygen Therapy: Patient Spontanous Breathing and Patient connected to nasal cannula oxygen  Post-op Assessment: Report given to RN and Post -op Vital signs reviewed and stable  Post vital signs: Reviewed and stable  Last Vitals:  Vitals Value Taken Time  BP 119/70 10/01/23 07:47  Temp    Pulse 73 10/01/23 07:47  Resp 8 10/01/23 07:47  SpO2 93 % 10/01/23 07:47    Last Pain:  Vitals:   10/01/23 0747  PainSc: 0-No pain         Complications: No notable events documented.

## 2023-10-01 NOTE — Interval H&P Note (Signed)
 History and Physical Interval Note:  10/01/2023 6:59 AM  Ann Mendez  has presented today for surgery, with the diagnosis of G56.01 right carpal tunnel syndrome.  The various methods of treatment have been discussed with the patient and family. After consideration of risks, benefits and other options for treatment, the patient has consented to  Procedure(s) with comments: CARPAL TUNNEL RELEASE (Right) - RIGHT CARPAL TUNNEL RELEASE WITH ULTRASOUND GUIDANCE as a surgical intervention.  The patient's history has been reviewed, patient examined, no change in status, stable for surgery.  I have reviewed the patient's chart and labs.  Questions were answered to the patient's satisfaction.    Heart and lungs are clear  [name]

## 2023-10-01 NOTE — Discharge Instructions (Addendum)

## 2023-10-01 NOTE — Progress Notes (Signed)
 Referring Physician:  No referring provider defined for this encounter.  Primary Physician:  Katina Pfeiffer, PA-C  History of Present Illness: 10/01/23  Ms. Torre Pikus is here today with a chief complaint of bilateral carpal tunnel syndrome.  She had a prior left-sided carpal tunnel release and has had a good result so far.  Overall she is very pleased.  She is here today for her right sided symptoms.  These continue to wake her at nighttime.  Is in her fearing with her quality of life.  She has had a recent EMG which demonstrates right-sided carpal tunnel syndrome.  Here today to perform her procedure.  . Conservative measures:  Physical therapy: Yes Occupational Therapy: No Hand Therapy: No Injections: Yes Gabapentin : Yes, Lyrica: No, Cymbalta: No Past Surgery: No  Review of Systems:  A 10 point review of systems is negative, except for the pertinent positives and negatives detailed in the HPI.  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   Complex tear of medial meniscus of right knee 01/01/2017   Depression    Headache    orgasm HA   History of Bell's palsy    History of colon polyps    Hyperlipidemia    Hypothyroidism    Lateral meniscus tear 12/2016   right medial   Right carpal tunnel syndrome    Thyroid  disease     Past Surgical History: Past Surgical History:  Procedure Laterality Date   CARPAL TUNNEL RELEASE Left 08/18/2023   Procedure: CARPAL TUNNEL RELEASE;  Surgeon: Claudene Penne ORN, MD;  Location: [facility];  Service: Neurosurgery;  Laterality: Left;  LEFT CARPAL TUNNEL RELEASE WITH ULTRASOUND GUIDANCE   CATARACT EXTRACTION W/ INTRAOCULAR LENS IMPLANT Right 03/2015   CESAREAN SECTION  09/23/2000   COLONOSCOPY     x  3   KNEE ARTHROSCOPY Right 01/01/2017   Procedure: RIGHT KNEE ARTHROSCOPY, CHONDROPLASTY AND PARTIAL MEDIAL LATERAL MENISCECTOMY;  Surgeon: Josefina Chew, MD;  Location: [facility];  Service: Orthopedics;  Laterality: Right;   SKIN CANCER EXCISION     x 2   TUBAL LIGATION  09/23/2000    Allergies: Allergies as of 08/05/2023 - Review Complete 08/05/2023  Allergen Reaction Noted   Stadol [butorphanol] Other (See Comments) 01/23/2021   Sulfa antibiotics Other (See Comments) 12/29/2016    Medications:  Current Facility-Administered Medications:    ceFAZolin  (ANCEF ) IVPB 2g/100 mL premix, 2 g, Intravenous, Once, Claudene, Penne ORN, MD   lactated ringers  infusion, , Intravenous, Continuous, Chesley Lendia CROME, MD  Social History: Social History   Tobacco Use   Smoking status: Never   Smokeless tobacco: Never  Vaping Use   Vaping status: Never Used  Substance Use Topics   Alcohol use: Not Currently    Comment: 1 beer on the weekend   Drug use: No    Family Medical History: Family History  Problem Relation Age of Onset   Arthritis Mother    Bipolar disorder Mother    Anxiety disorder Mother    Depression Mother    Heart failure Mother    Cancer Mother        Colon,Lung,Kidney   Colon cancer Mother 61  Heart disease Father    Hypertension Father    Cancer Father        Skin   Depression Sister    Colon polyps Brother    Heart disease Brother    Hypertension Brother    Cancer Brother        Skin   Alcohol abuse Brother    Bipolar disorder Maternal Grandmother    Alcohol abuse Maternal Grandmother    Depression Maternal Grandmother    Diabetes Maternal Grandfather    Esophageal cancer Neg Hx    Rectal cancer Neg Hx    Stomach cancer Neg Hx    Inflammatory bowel disease Neg Hx    Liver disease Neg Hx    Pancreatic cancer Neg Hx     Physical Examination: Vitals:   10/01/23 0627  BP: 131/65  Pulse: 71  Resp: 17  Temp: (!) 97.2 F (36.2 C)   SpO2: 99%    General: Patient is in no apparent distress. Attention to examination is appropriate.  Neck:   Supple.  Full range of motion.  Respiratory: Patient is breathing without any difficulty.   NEUROLOGICAL:     Awake, alert, oriented to person, place, and time.  Speech is clear and fluent.   Cranial Nerves: Pupils equal round and reactive to light.  Facial tone is symmetric. Shoulder shrug is symmetric. Tongue protrusion is midline.  There is no pronator drift.  Motor Exam:  She does show some mild thenar wasting bilaterally.  Left is worse than right.  Has a positive Phalen test, positive carpal compression test bilaterally.  Decreased sensation in the bilateral upper extremities in the median nerve distribution.  Gait is normal.    Medical Decision Making  Electrodiagnostics: EMG & NCV Findings: Evaluation of the left median motor and the right median motor nerves showed prolonged distal onset latency (L6.9, R5.2 ms) and decreased conduction velocity (Elbow-Wrist, L48, R49 m/s).  The left median (across palm) sensory nerve showed no response (Wrist) and no response (Palm).  The right median (across palm) sensory nerve showed prolonged distal peak latency (Wrist, 5.2 ms) and prolonged distal peak latency (Palm, 2.3 ms).  All remaining nerves (as indicated in the following tables) were within normal limits.  Left vs. Right side comparison data for the median motor nerve indicates abnormal L-R latency difference (1.7 ms).  The ulnar motor nerve indicates abnormal L-R velocity difference (A Elbow-B Elbow, 19 m/s).  All remaining left vs. right side differences were within normal limits.     All examined muscles (as indicated in the following table) showed no evidence of electrical instability.     Impression: The above electrodiagnostic study is ABNORMAL and reveals evidence of: A severe left median nerve entrapment at the wrist (carpal tunnel syndrome) affecting sensory and  motor components.  A moderate right median nerve entrapment at the wrist (carpal tunnel syndrome) affecting sensory and motor components.    There is no significant electrodiagnostic evidence of any other focal nerve entrapment, brachial plexopathy or cervical radiculopathy.  As you know, this particular electrodiagnostic study cannot rule out chemical radiculitis or sensory only radiculopathy.    **This electrodiagnostic study cannot rule out small fiber polyneuropathy and dysesthesias from central pain syndromes such as stroke or central pain sensitization syndromes such as fibromyalgia.  Myotomal referral pain from trigger points is also not excluded.   Recommendations: 1.  Follow-up with referring physician. 2.  Continue current management of symptoms. 3.  Suggest surgical evaluation.  ___________________________ Prentice Eldonna BETTERS Board Certified, American Board of Physical Medicine and Rehabilitation    I have personally reviewed the images and electrodiagnostics and agree with the above interpretation.  Assessment and Plan: Ms. Roussin is a pleasant 63 y.o. female with history of progressive carpal tunnel syndrome.  She has EMG diagnostics which demonstrate bilateral carpal tunnel syndrome left worse than right.  Symptomatically she is worse on the left than she is on the right.  She underwent a left-sided decompression previously and has a very good outcome.  She is looking forward to a right sided decompression today.  Will plan to take her back to the OR for ultrasound-guided carpal tunnel release.  Risk and benefits were discussed.  She wants to go forward with surgery.     Penne MICAEL Sharps MD/MSCR Neurosurgery - Peripheral Nerve Surgery

## 2023-10-01 NOTE — Op Note (Signed)
 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  Findings: Severe compression of the median nerve at the transverse carpal ligament  Preoperative Diagnosis:G56.01 right carpal tunnel syndrome  Postoperative Diagnosis: G56.01 right carpal tunnel syndrome   Postoperative Diagnosis: same   EBL: Minimal IVF: See anesthesia report Drains: none Disposition:Stable to PACU Complications: none  No foley catheter was placed.   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  They had tried rest, padding, and watchful waiting but had continued progressive symptoms.  Given the progression of her median neuropathy plan was made for median nerve decompression  Risk of surgery is discussed and include: Infection, bleeding, wound healing issues, pillar pain nerve injury, pain, failure to relieve the symptoms, need for further surgery.  Procedure:  1) right sided carpal tunnel decompression with ultrasound guidance   Procedure: After obtaining informed consent, the patient taken to the operating room, placed in supine position, monitored anesthesia care was induced.  They were given preoperative antibiotics.  Prepped and draped in the usual fashion.  Comprehensive timeout was performed verifying the patient's name, MRN, planned procedure.  An ultrasound was used with a sterile probe.  We used this to mark out our safety points including the interval between the ulnar artery and median nerve.  We identified the motor branch as well as the first sensory branch which were both in safe position.  We also identified the vascular arcade which was in safe positioning as well.  At this point we placed a block with Marcaine without epinephrine.  We blocked the skin where the incision would be, the superficial sensory median nerve, as well as  the transverse carpal ligament.  Under ultrasound guidance we utilized the local anesthetic to perform a hydrodissection of the nerve from the transverse carpal ligament.  We then prepped the sonexs ultra CTR knife on the back table while the anesthetic set in.  We performed a small linear incision approximately 2 to 3 mm.  We then utilized a Statistician under ultrasound guidance to identify the underside of the transverse carpal ligament.  The fat and connective tissue was dissected off the underside.  We could feel that this was quite thickened and calcified.  Causing severe compression.  Once we had a clear tract we then placed the ultrasound-guided knife into the incision and advanced it through the carpal tunnel.  We verified the safety zones including the median nerve which did not significantly cross over the knife.  We are able to see the first sensory branch which was not crossing the knife.  The artery was also in a safe place.  At this point we divided the transverse carpal ligament under ultrasound guidance, to get a full release it took 2 passes.  The nerve relaxed laterally and was well decompressed.  After the 2 passes we placed a [place] 4 into the wound and were able to feel a complete dissection of the transverse carpal ligament.  We then irrigated, we got meticulous hemostasis.  Skin glue was placed on the incision and a Band-Aid was placed on top once this was dried.  No immediate complications.  Sponge and pattie counts were correct at the end of the procedure.   I performed the procedure without an assistant surgeon  Lovenia Kim, MD/MSCR

## 2023-10-02 ENCOUNTER — Encounter: Payer: Self-pay | Admitting: Neurosurgery

## 2023-10-02 ENCOUNTER — Other Ambulatory Visit (HOSPITAL_COMMUNITY): Payer: Self-pay

## 2023-10-02 MED ORDER — METHYLPHENIDATE HCL ER (OSM) 18 MG PO TBCR
18.0000 mg | EXTENDED_RELEASE_TABLET | Freq: Every day | ORAL | 0 refills | Status: DC
  Filled 2023-10-06 (×2): qty 30, 30d supply, fill #0

## 2023-10-06 ENCOUNTER — Other Ambulatory Visit (HOSPITAL_COMMUNITY): Payer: Self-pay

## 2023-10-06 ENCOUNTER — Other Ambulatory Visit: Payer: Self-pay

## 2023-10-06 ENCOUNTER — Other Ambulatory Visit (HOSPITAL_BASED_OUTPATIENT_CLINIC_OR_DEPARTMENT_OTHER): Payer: Self-pay

## 2023-10-08 DIAGNOSIS — F411 Generalized anxiety disorder: Secondary | ICD-10-CM | POA: Diagnosis not present

## 2023-10-09 ENCOUNTER — Other Ambulatory Visit: Payer: Self-pay

## 2023-10-09 ENCOUNTER — Other Ambulatory Visit (HOSPITAL_COMMUNITY): Payer: Self-pay

## 2023-10-13 ENCOUNTER — Other Ambulatory Visit (HOSPITAL_COMMUNITY): Payer: Self-pay

## 2023-10-13 ENCOUNTER — Other Ambulatory Visit (HOSPITAL_BASED_OUTPATIENT_CLINIC_OR_DEPARTMENT_OTHER): Payer: Self-pay

## 2023-10-13 MED ORDER — COMIRNATY 30 MCG/0.3ML IM SUSY
0.3000 mL | PREFILLED_SYRINGE | Freq: Once | INTRAMUSCULAR | 0 refills | Status: AC
  Filled 2023-10-13: qty 0.3, 1d supply, fill #0

## 2023-10-13 MED ORDER — FLUZONE 0.5 ML IM SUSY
0.5000 mL | PREFILLED_SYRINGE | Freq: Once | INTRAMUSCULAR | 0 refills | Status: AC
  Filled 2023-10-13: qty 0.5, 1d supply, fill #0

## 2023-10-14 ENCOUNTER — Ambulatory Visit: Admitting: Physician Assistant

## 2023-10-14 VITALS — BP 120/74 | Temp 98.2°F | Ht 61.25 in | Wt 115.0 lb

## 2023-10-14 DIAGNOSIS — G5602 Carpal tunnel syndrome, left upper limb: Secondary | ICD-10-CM

## 2023-10-14 DIAGNOSIS — Z09 Encounter for follow-up examination after completed treatment for conditions other than malignant neoplasm: Secondary | ICD-10-CM

## 2023-10-14 DIAGNOSIS — G5603 Carpal tunnel syndrome, bilateral upper limbs: Secondary | ICD-10-CM

## 2023-10-14 DIAGNOSIS — G5601 Carpal tunnel syndrome, right upper limb: Secondary | ICD-10-CM

## 2023-10-14 NOTE — Progress Notes (Signed)
   REFERRING PHYSICIAN:  Katina Pfeiffer, Pa-c [address] [place],  [place] [address]  DOS: 08/18/23, Left US  guided CTR 10/01/23, R CTR  HISTORY OF PRESENT ILLNESS: Ann Mendez is status post bilateral ultrasound-guided carpal tunnel releases.  She is doing very well.  Pain is gone in both hands she still has some paresthesias at the tips of her fingers but overall her numbness and tingling has improved.  She does have some pillar pain present.   PHYSICAL EXAMINATION:  NEUROLOGICAL:  General: In no acute distress.   Awake, alert, oriented to person, place, and time.  Pupils equal round and reactive to light.  Facial tone is symmetric.    Strength: Exam is to baseline. Incision c/d/I    Assessment / Plan: Ann Mendez is doing well after status post above surgery.. Her pain has improved as well as her numbness and tingling.  We discussed her pillar pain and the utility of occupational hand therapy to help with this.  She would like to hold off at this time.  I let her know she changed her mind that I would be happy to place a referral.  Advised to contact the office if any questions or concerns arise.   Lyle Decamp PA-C Dept of Neurosurgery

## 2023-10-16 ENCOUNTER — Other Ambulatory Visit (HOSPITAL_COMMUNITY): Payer: Self-pay

## 2023-10-19 ENCOUNTER — Other Ambulatory Visit (HOSPITAL_COMMUNITY): Payer: Self-pay

## 2023-10-20 ENCOUNTER — Telehealth: Payer: Self-pay | Admitting: Neurosurgery

## 2023-10-20 NOTE — Telephone Encounter (Signed)
 Patient is calling to let our office know that she is having constant pain in her right pinky finger by the base of her hand. She states that she has been experiencing trigger finger since her surgery and has gotten worse since she saw Brooke on 10/14/2023. She would like to know what she needs to do.

## 2023-10-21 ENCOUNTER — Other Ambulatory Visit: Payer: Self-pay | Admitting: Physician Assistant

## 2023-10-21 ENCOUNTER — Encounter: Admitting: Physician Assistant

## 2023-10-21 DIAGNOSIS — M6530 Trigger finger, unspecified finger: Secondary | ICD-10-CM

## 2023-10-29 DIAGNOSIS — F411 Generalized anxiety disorder: Secondary | ICD-10-CM | POA: Diagnosis not present

## 2023-10-31 ENCOUNTER — Other Ambulatory Visit (HOSPITAL_COMMUNITY): Payer: Self-pay

## 2023-10-31 ENCOUNTER — Other Ambulatory Visit: Payer: Self-pay

## 2023-11-01 ENCOUNTER — Other Ambulatory Visit (HOSPITAL_COMMUNITY): Payer: Self-pay

## 2023-11-03 ENCOUNTER — Other Ambulatory Visit (HOSPITAL_COMMUNITY): Payer: Self-pay

## 2023-11-03 ENCOUNTER — Other Ambulatory Visit: Payer: Self-pay | Admitting: Physician Assistant

## 2023-11-03 ENCOUNTER — Telehealth: Payer: Self-pay

## 2023-11-03 ENCOUNTER — Other Ambulatory Visit: Payer: Self-pay

## 2023-11-03 MED ORDER — ACYCLOVIR 200 MG PO CAPS
200.0000 mg | ORAL_CAPSULE | Freq: Two times a day (BID) | ORAL | 2 refills | Status: AC
  Filled 2023-11-03: qty 60, 30d supply, fill #0

## 2023-11-03 NOTE — Telephone Encounter (Signed)
 Medication refill request: acyclovir  200mg  dx:hsv Last AEX:  03-16-23 Next AEX: not scheduled Last MMG (if hormonal medication request): n/a Refill authorized: patient is requesting a rx to be sent to her pharmacy. Last sent to pharmacy on 03-10-22 Rx sent to pharmacy #[id number] with 2 refills. Patient is aware

## 2023-11-04 ENCOUNTER — Encounter: Payer: Self-pay | Admitting: Neurosurgery

## 2023-11-04 ENCOUNTER — Other Ambulatory Visit: Payer: Self-pay | Admitting: Physician Assistant

## 2023-11-04 ENCOUNTER — Ambulatory Visit: Admitting: Neurosurgery

## 2023-11-04 ENCOUNTER — Other Ambulatory Visit (HOSPITAL_COMMUNITY): Payer: Self-pay

## 2023-11-04 VITALS — BP 132/84 | Temp 98.1°F | Ht 61.25 in | Wt 112.4 lb

## 2023-11-04 DIAGNOSIS — G5602 Carpal tunnel syndrome, left upper limb: Secondary | ICD-10-CM

## 2023-11-04 DIAGNOSIS — F411 Generalized anxiety disorder: Secondary | ICD-10-CM | POA: Diagnosis not present

## 2023-11-04 DIAGNOSIS — G5601 Carpal tunnel syndrome, right upper limb: Secondary | ICD-10-CM

## 2023-11-04 DIAGNOSIS — Z09 Encounter for follow-up examination after completed treatment for conditions other than malignant neoplasm: Secondary | ICD-10-CM

## 2023-11-04 DIAGNOSIS — G5603 Carpal tunnel syndrome, bilateral upper limbs: Secondary | ICD-10-CM

## 2023-11-04 MED ORDER — GABAPENTIN 100 MG PO CAPS
200.0000 mg | ORAL_CAPSULE | Freq: Every evening | ORAL | 0 refills | Status: AC | PRN
  Filled 2023-11-04 (×2): qty 60, 30d supply, fill #0

## 2023-11-04 MED ORDER — GABAPENTIN 100 MG PO CAPS
200.0000 mg | ORAL_CAPSULE | Freq: Two times a day (BID) | ORAL | 0 refills | Status: DC
  Filled 2023-11-04: qty 180, 45d supply, fill #0

## 2023-11-04 MED ORDER — GABAPENTIN 100 MG PO CAPS
300.0000 mg | ORAL_CAPSULE | Freq: Three times a day (TID) | ORAL | 0 refills | Status: DC
  Filled 2023-11-04: qty 180, 20d supply, fill #0

## 2023-11-04 NOTE — Progress Notes (Signed)
   REFERRING PHYSICIAN:  Katina Pfeiffer, Pa-c [address] [place],  [place] [address]  DOS: 08/18/23, Left US  guided CTR 10/01/23, R CTR  HISTORY OF PRESENT ILLNESS: Ann Mendez is status post bilateral ultrasound-guided carpal tunnel releases.  She is doing very well.  Pain is gone in both hands she still has some paresthesias at the tips of her fingers but overall her numbness and tingling has improved.  Her pillar pain has improved significantly.  Overall she is doing very well and happy able to sleep throughout the night and does not get nightly awakenings like she was previously.  She has developed some worsening triggering.   PHYSICAL EXAMINATION:  NEUROLOGICAL:  General: In no acute distress.   Awake, alert, oriented to person, place, and time.  Pupils equal round and reactive to light.  Facial tone is symmetric.    Strength: Exam is to baseline. Incision c/d/I   Assessment / Plan: Ann Mendez is doing well after status post above surgery.. Her pain has improved significantly, motor strength is improved as well.  She does continue to have numbness and some paresthesias as expected given the severity of her previous disease.  Her pillar pain got better postoperatively.  She does have some triggering for which she is going to see Dr. Agarwala for possible injections.  She has been able to almost completely wean off of her gabapentin  other than as needed nighttime dosing.  Advised to contact the office if any questions or concerns arise.   [name] MD Dept of Neurosurgery

## 2023-11-05 ENCOUNTER — Other Ambulatory Visit: Payer: Self-pay

## 2023-11-05 DIAGNOSIS — E2839 Other primary ovarian failure: Secondary | ICD-10-CM

## 2023-11-05 DIAGNOSIS — Z01419 Encounter for gynecological examination (general) (routine) without abnormal findings: Secondary | ICD-10-CM

## 2023-11-09 ENCOUNTER — Ambulatory Visit: Admitting: Orthopedic Surgery

## 2023-11-09 DIAGNOSIS — M65351 Trigger finger, right little finger: Secondary | ICD-10-CM

## 2023-11-09 DIAGNOSIS — M65342 Trigger finger, left ring finger: Secondary | ICD-10-CM

## 2023-11-09 MED ORDER — BETAMETHASONE SOD PHOS & ACET 6 (3-3) MG/ML IJ SUSP
6.0000 mg | INTRAMUSCULAR | Status: AC | PRN
  Administered 2023-11-09: 6 mg via INTRA_ARTICULAR

## 2023-11-09 MED ORDER — LIDOCAINE HCL 1 % IJ SOLN
1.0000 mL | INTRAMUSCULAR | Status: AC | PRN
  Administered 2023-11-09: 1 mL

## 2023-11-09 NOTE — Progress Notes (Signed)
 Ann Mendez - 63 y.o. female MRN [id number]  Date of birth: 09-Feb-1960  Office Visit Note: Visit Date: 11/09/2023 PCP: Katina Pfeiffer, PA-C Referred by: Ulis Bottcher, PA-C  Subjective: No chief complaint on file.  HPI: Ann Mendez is a pleasant 63 y.o. female who presents today for evaluation of right small finger and left ring finger triggering that is been present now for roughly 5 weeks.  She has more pain on the right hand, the left ring with minimal pain, occasional clicking and locking.  Has noticed significant stiffness in the morning as well.  Has not undergone any significant treatments for the triggers.  Of note, did undergo recent ultrasound-guided carpal tunnel release releases bilaterally by Dr. Claudene, these are doing well.  Pertinent ROS were reviewed with the patient and found to be negative unless otherwise specified above in HPI.   Visit Reason: right small finger trigger, left ring finger trigger Duration of symptoms: 5 weeks Hand dominance: right Occupation: relief work for [employer]  Diabetic: No Smoking: No Heart/Lung History: none Blood Thinners:  none  Prior Testing/EMG: none Injections (Date): none for trigger treatments  Treatments: heat, voltaren  75 mg Prior Surgery: Left CTR 10/01/23 Right CTR 08/18/23  Assessment & Plan: Visit Diagnoses:  1. Trigger finger, right little finger   2. Trigger finger, left ring finger     Plan: Extensive discussion was had with the patient today regarding her right small and left ring trigger digit.  We discussed the etiology and pathophysiology of stenosing tenosynovitis.  We discussed conservative versus surgical treatment modalities.  From a conservative standpoint, we discussed activity modification, splinting, therapy and injections.  From a surgical standpoint, we discussed the possibility for trigger digit release as well as all risk and benefits associated.  Given that she has not trialed  conservative treatments, patient is appropriate candidate for cortisone injection to the right small finger A1 pulley for symptom relief.  Given that the left side is minimally symptomatic currently, I did explain to her Band-Aid splinting for the PIP.  Risks and benefit of the cortisone injection were discussed in detail, patient agreed to proceed.  Injection was provided today without issue to the right small finger, patient will return in approximate 6 weeks time for a recheck.   Follow-up: No follow-ups on file.   Meds & Orders: No orders of the defined types were placed in this encounter.   Orders Placed This Encounter  Procedures   Hand/UE Inj     Procedures: Hand/UE Inj: R small A1 for trigger finger on 11/09/2023 10:00 AM Indications: pain Details: 25 G needle, volar approach Medications: 1 mL lidocaine  1 %; 6 mg betamethasone acetate-betamethasone sodium phosphate  6 (3-3) MG/ML Outcome: tolerated well, no immediate complications Consent was given by the patient. Patient was prepped and draped in the usual sterile fashion.          Clinical History: CLINICAL DATA:  Inflammatory spondyloarthropathy.  Right neck pain.   EXAM: MRI CERVICAL SPINE WITHOUT CONTRAST   TECHNIQUE: Multiplanar, multisequence MR imaging of the cervical spine was performed. No intravenous contrast was administered.   COMPARISON:  08/28/2022 radiographs   FINDINGS: Alignment: Reversal the normal cervical lordosis with epicenter at C4-5. 2 mm degenerative retrolisthesis at C6-7. 1.5 mm degenerative anterolisthesis at C2-3 and C3-4.   Vertebrae: Disc desiccation loss of disc height throughout the cervical spine but especially at C4-5, C5-6, and C6-7. Degenerative endplate findings especially at C4-5, C5-6, and C6-7. Small Schmorl's node  along the inferior endplate of C4. Schmorl's nodes along the anterior superior endplates at C7 and T1. 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   Cord: No significant abnormal spinal cord signal is observed.   Posterior Fossa, vertebral arteries, paraspinal tissues: Unremarkable   Disc levels:   C2-3: No impingement.  Bilateral degenerative facet arthropathy   C3-4: Moderate right foraminal stenosis due to uncinate and facet spurring along with disc bulge. Central disc protrusion extends cephalad.   C4-5: Moderate to prominent left and moderate right foraminal stenosis due to disc osteophyte complex and facet arthropathy.   C5-6: Prominent left and moderate right foraminal stenosis and mild central narrowing of the thecal sac due to disc bulge, uncinate spurring, and facet arthropathy.   C6-7: Moderate left and mild right foraminal stenosis and mild central narrowing of the thecal sac due to disc osteophyte complex and facet arthropathy.   C7-T1: Borderline left foraminal stenosis due to facet spurring.   IMPRESSION: 1. Cervical spondylosis and degenerative disc disease causing prominent impingement at C5-6; moderate to prominent impingement at C4-5; moderate impingement at C3-4 and C6-7, as detailed above. 2. Reversal the normal cervical lordosis with epicenter at C4-5. 3. 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     Electronically Signed   By: Ryan Salvage M.D.   On: 10/20/2022 13:28  She reports that she has never smoked. She has never used smokeless tobacco. No results for input(s): HGBA1C, LABURIC in the last 8760 hours.  Objective:   Vital Signs: LMP 10/16/2011   Physical Exam  Gen: Well-appearing, in no acute distress; non-toxic CV: Regular Rate. Well-perfused. Warm.  Resp: Breathing unlabored on room air; no wheezing. Psych: Fluid speech in conversation; appropriate affect; normal thought process  Ortho Exam Right hand: - Palpable nodule at the A1 pulley of the small finger, associated tenderness - Notable clicking with deep flexion of the  small finger, there is evidence of significant locking with deep flexion - Sensation intact distally, hand remains warm well-perfused  Left hand: - Palpable nodule at the A1 pulley of the ring finger, minimal tenderness - Notable clicking with deep flexion of the ring finger - Sensation intact distally, hand remains warm well-perfused    Imaging: No results found.  Past Medical/Family/Surgical/Social History: Medications & Allergies reviewed per EMR, new medications updated. Patient Active Problem List   Diagnosis Date Noted   Right carpal tunnel syndrome 10/01/2023   Left carpal tunnel syndrome 08/17/2023   Constipation 04/25/2023   History of adenomatous and serrated colon polyps 04/25/2023   Acute pain of right shoulder 08/30/2020   Hallux rigidus, left foot 03/01/2019   Soft tissue mass 03/01/2019   Bunion 03/01/2019   Complex tear of medial meniscus of right knee 01/01/2017   Right knee pain 07/10/2016   Depression 11/11/2011   DJD (degenerative joint disease) 10/09/2011   Other and unspecified hyperlipidemia 10/09/2011   Menopause 10/09/2011   Spinal stenosis 10/09/2011   History of Bell's palsy 10/09/2011   Herpes progenitalis    Osteoarthritis    ABDOMINAL PAIN, LEFT LOWER QUADRANT 07/27/2007   BIPOLAR DISORDER UNSPECIFIED 07/26/2007   Past Medical History:  Diagnosis Date   Allergy    Anxiety    Arthritis    left great toe   Bipolar disorder (HCC)    Cancer (HCC)    skin   Carpal tunnel syndrome    bilateral   Cervical spine degeneration    Chondromalacia of knee, right  12/2016   Complex tear of medial meniscus of right knee 01/01/2017   Depression    Headache    orgasm HA   History of Bell's palsy    History of colon polyps    HSV infection    Hyperlipidemia    Hypothyroidism    Lateral meniscus tear 12/2016   right medial   Right carpal tunnel syndrome    Thyroid  disease    Family History  Problem Relation Age of Onset   Arthritis Mother     Bipolar disorder Mother    Anxiety disorder Mother    Depression Mother    Heart failure Mother    Cancer Mother        Colon,Lung,Kidney   Colon cancer Mother 53   Heart disease Father    Hypertension Father    Cancer Father        Skin   Depression Sister    Colon polyps Brother    Heart disease Brother    Hypertension Brother    Cancer Brother        Skin   Alcohol abuse Brother    Bipolar disorder Maternal Grandmother    Alcohol abuse Maternal Grandmother    Depression Maternal Grandmother    Diabetes Maternal Grandfather    Esophageal cancer Neg Hx    Rectal cancer Neg Hx    Stomach cancer Neg Hx    Inflammatory bowel disease Neg Hx    Liver disease Neg Hx    Pancreatic cancer Neg Hx    Past Surgical History:  Procedure Laterality Date   CARPAL TUNNEL RELEASE Left 08/18/2023   Procedure: CARPAL TUNNEL RELEASE;  Surgeon: Claudene Penne ORN, MD;  Location: [facility];  Service: Neurosurgery;  Laterality: Left;  LEFT CARPAL TUNNEL RELEASE WITH ULTRASOUND GUIDANCE   CARPAL TUNNEL RELEASE Right 10/01/2023   Procedure: CARPAL TUNNEL RELEASE;  Surgeon: Claudene Penne ORN, MD;  Location: [facility];  Service: Neurosurgery;  Laterality: Right;  RIGHT CARPAL TUNNEL RELEASE WITH ULTRASOUND GUIDANCE   CATARACT EXTRACTION W/ INTRAOCULAR LENS IMPLANT Right 03/2015   CESAREAN SECTION  09/23/2000   COLONOSCOPY     x 3   KNEE ARTHROSCOPY Right 01/01/2017   Procedure: RIGHT KNEE ARTHROSCOPY, CHONDROPLASTY AND PARTIAL MEDIAL LATERAL MENISCECTOMY;  Surgeon: Josefina Chew, MD;  Location: [facility];  Service: Orthopedics;  Laterality: Right;   SKIN CANCER EXCISION     x 2   TUBAL LIGATION  09/23/2000   Social History   Occupational History   Not on file  Tobacco Use   Smoking status: Never   Smokeless tobacco: Never  Vaping Use   Vaping status: Never Used  Substance and Sexual Activity   Alcohol use: Not Currently    Comment: 1 beer on the weekend   Drug use: No    Sexual activity: Yes    Birth control/protection: Surgical, Post-menopausal    Comment: 1st intercourse 63 yo-More than 5 partners, btl    Kaelen Caughlin Estela) Arlinda, M.D. [facility], Hand Surgery

## 2023-11-10 DIAGNOSIS — Z0100 Encounter for examination of eyes and vision without abnormal findings: Secondary | ICD-10-CM | POA: Diagnosis not present

## 2023-11-11 DIAGNOSIS — F411 Generalized anxiety disorder: Secondary | ICD-10-CM | POA: Diagnosis not present

## 2023-11-12 ENCOUNTER — Other Ambulatory Visit (HOSPITAL_COMMUNITY): Payer: Self-pay

## 2023-11-12 ENCOUNTER — Other Ambulatory Visit: Payer: Self-pay

## 2023-11-12 MED ORDER — METHYLPHENIDATE HCL ER (OSM) 18 MG PO TBCR
18.0000 mg | EXTENDED_RELEASE_TABLET | Freq: Every day | ORAL | 0 refills | Status: DC
  Filled 2023-11-12: qty 30, 30d supply, fill #0

## 2023-11-16 ENCOUNTER — Encounter: Payer: Self-pay | Admitting: Radiology

## 2023-11-18 ENCOUNTER — Encounter: Admitting: Neurosurgery

## 2023-11-19 DIAGNOSIS — F411 Generalized anxiety disorder: Secondary | ICD-10-CM | POA: Diagnosis not present

## 2023-11-24 DIAGNOSIS — F411 Generalized anxiety disorder: Secondary | ICD-10-CM | POA: Diagnosis not present

## 2023-12-14 ENCOUNTER — Other Ambulatory Visit: Payer: Self-pay

## 2023-12-14 ENCOUNTER — Other Ambulatory Visit (HOSPITAL_COMMUNITY): Payer: Self-pay

## 2023-12-14 MED ORDER — METHYLPHENIDATE HCL ER (OSM) 18 MG PO TBCR
18.0000 mg | EXTENDED_RELEASE_TABLET | Freq: Every day | ORAL | 0 refills | Status: DC
  Filled 2023-12-14: qty 30, 30d supply, fill #0

## 2023-12-15 ENCOUNTER — Other Ambulatory Visit (HOSPITAL_COMMUNITY): Payer: Self-pay

## 2023-12-15 ENCOUNTER — Other Ambulatory Visit: Payer: Self-pay

## 2023-12-15 DIAGNOSIS — Z13820 Encounter for screening for osteoporosis: Secondary | ICD-10-CM | POA: Diagnosis not present

## 2023-12-15 DIAGNOSIS — Z780 Asymptomatic menopausal state: Secondary | ICD-10-CM | POA: Diagnosis not present

## 2023-12-15 LAB — HM DEXA SCAN: HM Dexa Scan: NORMAL

## 2023-12-15 MED ORDER — DICLOFENAC SODIUM 75 MG PO TBEC
75.0000 mg | DELAYED_RELEASE_TABLET | ORAL | 0 refills | Status: AC
  Filled 2023-12-15: qty 45, 90d supply, fill #0

## 2023-12-16 ENCOUNTER — Encounter: Payer: Self-pay | Admitting: Obstetrics and Gynecology

## 2023-12-16 ENCOUNTER — Other Ambulatory Visit (HOSPITAL_COMMUNITY): Payer: Self-pay

## 2023-12-16 ENCOUNTER — Other Ambulatory Visit: Payer: Self-pay

## 2023-12-16 ENCOUNTER — Ambulatory Visit: Payer: Self-pay | Admitting: Obstetrics and Gynecology

## 2023-12-16 MED ORDER — ESTRADIOL 0.01 % VA CREA
1.0000 g | TOPICAL_CREAM | VAGINAL | 0 refills | Status: AC
  Filled 2023-12-16: qty 42.5, 70d supply, fill #0

## 2023-12-17 ENCOUNTER — Other Ambulatory Visit (HOSPITAL_COMMUNITY): Payer: Self-pay

## 2023-12-21 ENCOUNTER — Ambulatory Visit: Admitting: Orthopedic Surgery

## 2023-12-21 ENCOUNTER — Other Ambulatory Visit (HOSPITAL_COMMUNITY): Payer: Self-pay

## 2023-12-21 NOTE — Progress Notes (Deleted)
 Right hand follow up; injection in right small finger

## 2023-12-22 ENCOUNTER — Other Ambulatory Visit (HOSPITAL_COMMUNITY): Payer: Self-pay

## 2024-01-08 ENCOUNTER — Other Ambulatory Visit (HOSPITAL_COMMUNITY): Payer: Self-pay

## 2024-01-13 ENCOUNTER — Other Ambulatory Visit (HOSPITAL_COMMUNITY): Payer: Self-pay

## 2024-01-13 MED ORDER — METHYLPHENIDATE HCL ER (OSM) 18 MG PO TBCR
18.0000 mg | EXTENDED_RELEASE_TABLET | Freq: Every day | ORAL | 0 refills | Status: DC
  Filled 2024-01-13: qty 30, 30d supply, fill #0

## 2024-01-13 MED ORDER — TRAZODONE HCL 50 MG PO TABS
50.0000 mg | ORAL_TABLET | Freq: Every evening | ORAL | 3 refills | Status: AC | PRN
  Filled 2024-01-13: qty 30, 30d supply, fill #0

## 2024-02-10 ENCOUNTER — Other Ambulatory Visit (HOSPITAL_COMMUNITY): Payer: Self-pay

## 2024-02-10 MED ORDER — METHYLPHENIDATE HCL ER (OSM) 18 MG PO TBCR
18.0000 mg | EXTENDED_RELEASE_TABLET | Freq: Every day | ORAL | 0 refills | Status: AC
  Filled 2024-02-12: qty 30, 30d supply, fill #0

## 2024-02-12 ENCOUNTER — Other Ambulatory Visit: Payer: Self-pay

## 2024-02-15 ENCOUNTER — Other Ambulatory Visit (HOSPITAL_COMMUNITY): Payer: Self-pay

## 2024-02-17 ENCOUNTER — Other Ambulatory Visit (HOSPITAL_BASED_OUTPATIENT_CLINIC_OR_DEPARTMENT_OTHER): Payer: Self-pay

## 2024-02-17 MED ORDER — AREXVY 120 MCG/0.5ML IM SUSR
INTRAMUSCULAR | 0 refills | Status: AC
  Filled 2024-02-17: qty 0.5, 1d supply, fill #0
